# Patient Record
Sex: Male | Born: 1960 | Race: Black or African American | Hispanic: No | Marital: Married | Smoking: Former smoker
Health system: Southern US, Community
[De-identification: ages and names within clinical notes are randomized; demographics above are authoritative.]

## PROBLEM LIST (undated history)

## (undated) DIAGNOSIS — H269 Unspecified cataract: Secondary | ICD-10-CM

## (undated) DIAGNOSIS — E118 Type 2 diabetes mellitus with unspecified complications: Secondary | ICD-10-CM

## (undated) DIAGNOSIS — K219 Gastro-esophageal reflux disease without esophagitis: Secondary | ICD-10-CM

## (undated) DIAGNOSIS — K760 Fatty (change of) liver, not elsewhere classified: Secondary | ICD-10-CM

## (undated) DIAGNOSIS — K5792 Diverticulitis of intestine, part unspecified, without perforation or abscess without bleeding: Secondary | ICD-10-CM

## (undated) DIAGNOSIS — I2510 Atherosclerotic heart disease of native coronary artery without angina pectoris: Secondary | ICD-10-CM

## (undated) DIAGNOSIS — I6529 Occlusion and stenosis of unspecified carotid artery: Secondary | ICD-10-CM

## (undated) DIAGNOSIS — D509 Iron deficiency anemia, unspecified: Secondary | ICD-10-CM

## (undated) DIAGNOSIS — Z9289 Personal history of other medical treatment: Secondary | ICD-10-CM

## (undated) DIAGNOSIS — J45909 Unspecified asthma, uncomplicated: Secondary | ICD-10-CM

## (undated) DIAGNOSIS — E785 Hyperlipidemia, unspecified: Secondary | ICD-10-CM

## (undated) DIAGNOSIS — G44009 Cluster headache syndrome, unspecified, not intractable: Secondary | ICD-10-CM

## (undated) HISTORY — DX: Gastro-esophageal reflux disease without esophagitis: K21.9

## (undated) HISTORY — DX: Hyperlipidemia, unspecified: E78.5

## (undated) HISTORY — DX: Occlusion and stenosis of unspecified carotid artery: I65.29

## (undated) HISTORY — DX: Type 2 diabetes mellitus with unspecified complications: E11.8

## (undated) HISTORY — DX: Unspecified cataract: H26.9

## (undated) HISTORY — DX: Personal history of other medical treatment: Z92.89

## (undated) HISTORY — DX: Fatty (change of) liver, not elsewhere classified: K76.0

## (undated) HISTORY — DX: Diverticulitis of intestine, part unspecified, without perforation or abscess without bleeding: K57.92

## (undated) HISTORY — DX: Cluster headache syndrome, unspecified, not intractable: G44.009

---

## 1979-08-22 DIAGNOSIS — D509 Iron deficiency anemia, unspecified: Secondary | ICD-10-CM

## 1979-08-22 HISTORY — DX: Iron deficiency anemia, unspecified: D50.9

## 1983-12-22 DIAGNOSIS — G44009 Cluster headache syndrome, unspecified, not intractable: Secondary | ICD-10-CM

## 1983-12-22 HISTORY — PX: RADIAL KERATOTOMY: SHX217

## 1983-12-22 HISTORY — DX: Cluster headache syndrome, unspecified, not intractable: G44.009

## 2003-09-03 ENCOUNTER — Encounter: Payer: Self-pay | Admitting: Emergency Medicine

## 2003-09-03 ENCOUNTER — Inpatient Hospital Stay (HOSPITAL_COMMUNITY): Admission: EM | Admit: 2003-09-03 | Discharge: 2003-09-05 | Payer: Self-pay | Admitting: Emergency Medicine

## 2003-09-04 ENCOUNTER — Encounter: Payer: Self-pay | Admitting: Cardiology

## 2003-10-01 ENCOUNTER — Encounter: Payer: Self-pay | Admitting: Cardiology

## 2003-10-01 ENCOUNTER — Encounter: Admission: RE | Admit: 2003-10-01 | Discharge: 2003-10-01 | Payer: Self-pay | Admitting: Cardiology

## 2008-06-26 ENCOUNTER — Emergency Department (HOSPITAL_COMMUNITY): Admission: EM | Admit: 2008-06-26 | Discharge: 2008-06-26 | Payer: Self-pay | Admitting: Emergency Medicine

## 2011-01-12 ENCOUNTER — Encounter: Payer: Self-pay | Admitting: Cardiology

## 2011-05-08 NOTE — Discharge Summary (Signed)
NAME:  Bradley Watson, Bradley Watson                        ACCOUNT NO.:  [id number]   MEDICAL RECORD NO.:  [id number]                   PATIENT TYPE:  INP   LOCATION:  2001                                 FACILITY:  [facility]   PHYSICIAN:  Eduardo Osier. Sharyn Lull, M.D.              DATE OF BIRTH:  [birth year]   DATE OF ADMISSION:  09/03/2003  DATE OF DISCHARGE:  09/04/2003                                 DISCHARGE SUMMARY   ADMISSION DIAGNOSES:  1. Recurrent chest pain, probable anginal equivalent, rule out myocardial     infarction.  2. Gastroesophageal reflux disease.  3. History of tobacco abuse.  4. Cocaine abuse.  5. History of alcohol abuse.  6. Glaucoma of the eyes.   FINAL DIAGNOSES:  1. Status post chest pain, negative stress Cardiolite.  2. Rule out gastroesophageal reflux disease.  3. Rule out peptic ulcer disease.  4. Hypercholesterolemia.  5. History of tobacco abuse.  6. History of cocaine abuse.  7. History of alcohol abuse.  8. Glaucoma of the eyes.   DISCHARGE MEDICATIONS:  1. Baby aspirin 81 mg one daily.  2. Protonix 40 mg one p.o. daily.  3. Lipitor 20 mg one tablet daily.  4. Nitrostat 0.4 mg sublingual - use as directed.  5. The patient has been advised to continue his eye drops as before.   ACTIVITY:  As tolerated.   DIET:  Low salt, low cholesterol.   FOLLOWUP:  With Dr. Shana Chute in two weeks.   CONDITION ON DISCHARGE:  Stable.   BRIEF HISTORY AND HOSPITAL COURSE:  [name] is a 50 year old black male  with past medical history significant for GERD, history of glaucoma of the  eyes.  He came to the ER complaining of retrosternal chest pain described as  discomfort off and on for one month and lately the severity has increased  with occasional right arm pain, grade 8/10 associated with exertion and is  relieved with rest.  Denies any nausea, vomiting, diaphoresis.  Denies  paroxysmal nocturnal dyspnea, orthopnea, leg swelling.  Denies palpitations,  light  headedness or syncope.  Denies relation of chest pain to breathing,  food or movement.  Denies any cough, fever, chills.  Denies any cardiac work  up in the past.  Denies history of recent cocaine abuse.   PAST MEDICAL HISTORY:  As above.   PAST SURGICAL HISTORY:  He had radial __________ in the past.   ALLERGIES:  He is allergic to PENICILLIN.   MEDICATION AT HOME:  Prilosec and Xalatan eye drops.   SOCIAL HISTORY:  He is married.  Smoked one pack per week for one year, quit  five months ago.  He is a recovering addict.  He used to drink half of one-  fifth of hard liquor every other week for 30 years, quit five months ago.  He used to smoke cocaine for approximately 20 years off and on two to three  times per week.  Last cocaine abuse was six months ago.  No history of IV  drugs.  Patient states he was tested for HIV approximately five months ago  which was negative.  He works for [employer].   FAMILY HISTORY:  Negative for coronary artery disease.   PHYSICAL EXAMINATION:  GENERAL: He was alert and oriented x3, in no acute  distress.  VITAL SIGNS: Blood pressure was 135/82, pulse was 53.  HEENT: Conjunctivae pink.  NECK: Supple, no JVD, no bruits.  LUNGS: Clear to auscultation without rhonchi, rales.  CARDIOVASCULAR: S1, S2 was normal, there was no S3 gallop.  ABDOMEN: Soft, bowel sounds are present, nontender.  EXTREMITIES: There is no clubbing, cyanosis or edema.   EKG showed sinus bradycardia but no acute ischemic changes.   LABORATORY DATA:  Two sets of cardiac enzymes, CPK's and troponin's were  negative.  His hemoglobin was 12.9, hematocrit 36.5, white count of 3700.  BUN 16, creatinine 0.9, cholesterol was 245, LDL 131, HDL 45.   BRIEF HISTORY AND HOSPITAL COURSE:  The patient was admitted to a telemetry  unit and myocardial infarction was ruled out by serial enzymes and EKG.  The  patient did not have any episodes of chest pain during the hospital stay.  The  patient underwent a stress Cardiolite which showed no evidence of  reversible ischemia today, with septal hypokinesia, ejection fraction of  47%.  The patient has been ambulating in the hallway without any problems.  The patient will be discharged home on above-medications and will be  followed by Dr. Shana Chute in two weeks.                                                Eduardo Osier. Sharyn Lull, M.D.    [facility]  D:  09/04/2003  T:  09/05/2003  Job:  [id number]

## 2011-09-17 LAB — BASIC METABOLIC PANEL
BUN: 11
CO2: 23
Calcium: 8.4
Chloride: 103
Creatinine, Ser: 0.71
GFR calc Af Amer: >60
GFR calc non Af Amer: >60
Glucose, Bld: 95
Potassium: 3.9
Sodium: 133 — ABNORMAL LOW

## 2011-09-17 LAB — CBC
HCT: 36.7 — ABNORMAL LOW
Hemoglobin: 12.2 — ABNORMAL LOW
MCHC: 33.2
MCV: 87.6
RDW: 16 — ABNORMAL HIGH

## 2011-09-17 LAB — POCT CARDIAC MARKERS
CKMB, poc: 2.3
Myoglobin, poc: 61.4
Myoglobin, poc: 62.2
Operator id: 294521

## 2012-02-19 DIAGNOSIS — K760 Fatty (change of) liver, not elsewhere classified: Secondary | ICD-10-CM

## 2012-02-19 DIAGNOSIS — K5792 Diverticulitis of intestine, part unspecified, without perforation or abscess without bleeding: Secondary | ICD-10-CM

## 2012-02-19 HISTORY — DX: Fatty (change of) liver, not elsewhere classified: K76.0

## 2012-02-19 HISTORY — DX: Diverticulitis of intestine, part unspecified, without perforation or abscess without bleeding: K57.92

## 2012-10-23 ENCOUNTER — Telehealth: Payer: Self-pay | Admitting: Gastroenterology

## 2012-10-23 NOTE — Telephone Encounter (Signed)
On call note at 1005. Pt has chest pain for several weeks that he feels is reflux related. He notes chest pain with exertion. He has not been seen by [facility] but has an appt with Dr. Jarold Motto soon and he would like to have the appt changed. I advised him to call Monday to try to reschedule his appt. I strongly advised him to contact his PCP immediately regarding his exertional chest pain. He states he has not seen his PCP in a few years and has not seen a Cardiologist. He seemed resistant to the idea that his pain might be cardiac and needed prompt attention.

## 2012-10-24 ENCOUNTER — Telehealth: Payer: Self-pay | Admitting: Gastroenterology

## 2012-10-24 ENCOUNTER — Encounter: Payer: Self-pay | Admitting: Physician Assistant

## 2012-10-24 ENCOUNTER — Ambulatory Visit (INDEPENDENT_AMBULATORY_CARE_PROVIDER_SITE_OTHER): Payer: PRIVATE HEALTH INSURANCE | Admitting: Physician Assistant

## 2012-10-24 VITALS — BP 136/82 | HR 80 | Ht 73.0 in | Wt 291.4 lb

## 2012-10-24 DIAGNOSIS — K219 Gastro-esophageal reflux disease without esophagitis: Secondary | ICD-10-CM

## 2012-10-24 DIAGNOSIS — R079 Chest pain, unspecified: Secondary | ICD-10-CM

## 2012-10-24 MED ORDER — DEXLANSOPRAZOLE 60 MG PO CPDR: 60.0000 mg | DELAYED_RELEASE_CAPSULE | Freq: Every day | ORAL | Status: DC

## 2012-10-24 NOTE — Telephone Encounter (Signed)
Pt reports symptoms on and off for several months, but Saturday, s&s were worse. 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 He states he can take a shower and feel this way and he gets it when he walks, especially up a hill. Explained to pt this does not sound like reflux or any GI symptom; it sounds cardiac. Pt replied he had the same trouble 20 years ago (?) and was told it was GI. I can find an ER visit for 2011 and then pt f/u with a Stress Test which he states was negative. Then, pt states a fellow he works with told him he had the same thing and he stated his "flap" wasn't working and acid is coming from his stomach. I asked about certain foods and he stated OJ and fried foods bother him. He woke up one night and acid came up in his throat and now he sleeps on several pillows. Pt states he has used Prilosec for years and has been using Zantac the last few weeks with better results. Pt will see Mike Gip, PA today.

## 2012-10-24 NOTE — Patient Instructions (Addendum)
We have given you samples of the following medication to take: Dexilant. Please take one capsule by mouth once daily before breakfast.  Please stop taking Zantac and Prilosec(Omperazole).  We made you an appointment with [facility] for 10-25-2012 at 2:30 pm, please arrive at 2:15 pm.   Per your request we faxed a work note to [employer] at [employer] at [phone].

## 2012-10-24 NOTE — Progress Notes (Signed)
Subjective:    Patient ID: Bradley Watson, male    DOB: 12-Apr-1961, 51 y.o.   MRN: [id number]  HPI Bradley Watson is a pleasant 51 year old African American male, known to Dr. Victorino Dike very remotely with diagnosis of acid reflux. He states that he has used over-the-counter Prilosec intermittently over the past several years, in general he only takes it when he has "burning" He comes in today stating that he has been having somewhat different symptoms over the past 3-4 weeks area He was awakened from sleep at about a month ago at 3 AM with fairly intense pain in the center of his chest which he states was burning in nature and nonradiating it lasted about an hour- This eventually eased off and was not associated with shortness of breath nausea vomiting or diaphoresis. Appendectomy says he Googled his symptoms, and decided that this could be coming from acid reflux. He is now taking his Prilosec on a daily basis. He says however ever since then he has been having daily symptoms with chest pain which is exacerbated by exertion. He says it is occurring with walking, is associated with a rapid heartbeat, fatigue, and the need to sit down. He says he feels somewhat winded with these episodes as well area he has not had any nausea vomiting or diaphoresis. 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 He is also had a few episodes with a discomfort in his left arm. He did have an episode of chest pain in 2004 for which he was admitted to the hospital, and review of those records shows that he did undergo a new meds stress test which was negative for ischemia him but did show some global hypokinesis with an ejection fraction of 47%. He is a nonsmoker, and family history is negative for coronary artery disease     Review of Systems  Constitutional: Negative.   HENT: Negative.   Eyes: Negative.   Respiratory: Positive for  shortness of breath.   Cardiovascular: Positive for chest pain and palpitations.  Gastrointestinal: Negative.   Genitourinary: Negative.   Musculoskeletal: Negative.   Neurological: Negative.   Hematological: Negative.   Psychiatric/Behavioral: Negative.    No outpatient prescriptions prior to visit.   Allergies  Allergen Reactions  . Penicillins Anxiety       Patient Active Problem List  Diagnosis  . GERD (gastroesophageal reflux disease)   History  Substance Use Topics  . Smoking status: Former Games developer  . Smokeless tobacco: Not on file     Comment: 15 yrs ago.  . Alcohol Use: Yes     Comment: Rarely, every 2-3 months miaght have a mixed drink.     Objective:   Physical Exam well-developed large African American male pleasant in no acute distress. Blood pressure 136/82 pulse 80 height 6 foot 1 weight 291. HEENT; nontraumatic normocephalic EOMI PERRLA sclera anicteric, Neck; supple no JVD, Cardiovascular; regular rate and rhythm with S1-S2 there's no murmur rub or gallop, Pulmonary; clear bilaterally, Abdomen; large soft nontender nondistended bowel sounds are active there is no palpable mass or hepatosplenomegaly, Rectal; not done, Extremities; no clubbing cyanosis or edema skin warm and dry, Psych; mood and affect normal and appropriate.        Assessment & Plan:  #64 51 year old male with new onset of exertional chest pain x3-4 weeks. Symptoms are associated with rapid heart rate, fatigue and  mild shortness of breath. His symptoms are definitely atypical for GERD and much more concerning for angina.  #2 obesity #3 history of GERD #4 previously documented mild cardiomyopathy with EF of 47%-2004  Plan; start Dexilant  60 mg by mouth every morning in place of Prilosec. Elevate the head of the bed 45 as he has been doing recently I spoke to Dr. Dorinda Hill cardiology, and patient has been worked in to see Dr. Riley Kill tomorrow November 5 at 2:30 PM. Patient has been  instructed that should he have a more prolonged episode of chest pain or more intense episode of chest pain that he should go to the emergency room at [facility]. He was given a note for work to remain out until after his cardiology visit tomorrow.

## 2012-10-25 ENCOUNTER — Ambulatory Visit (INDEPENDENT_AMBULATORY_CARE_PROVIDER_SITE_OTHER): Payer: PRIVATE HEALTH INSURANCE | Admitting: Cardiology

## 2012-10-25 ENCOUNTER — Ambulatory Visit: Payer: PRIVATE HEALTH INSURANCE | Admitting: Cardiology

## 2012-10-25 ENCOUNTER — Encounter: Payer: Self-pay | Admitting: Cardiology

## 2012-10-25 VITALS — BP 130/90 | HR 69 | Ht 73.0 in | Wt 290.0 lb

## 2012-10-25 DIAGNOSIS — R079 Chest pain, unspecified: Secondary | ICD-10-CM

## 2012-10-25 DIAGNOSIS — K219 Gastro-esophageal reflux disease without esophagitis: Secondary | ICD-10-CM

## 2012-10-25 MED ORDER — METOPROLOL TARTRATE 25 MG PO TABS: 12.5000 mg | ORAL_TABLET | Freq: Two times a day (BID) | ORAL | Status: DC

## 2012-10-25 NOTE — Patient Instructions (Addendum)
Your physician has requested that you have a cardiac catheterization. Cardiac catheterization is used to diagnose and/or treat various heart conditions. Doctors may recommend this procedure for a number of different reasons. The most common reason is to evaluate chest pain. Chest pain can be a symptom of coronary artery disease (CAD), and cardiac catheterization can show whether plaque is narrowing or blocking your heart's arteries. This procedure is also used to evaluate the valves, as well as measure the blood flow and oxygen levels in different parts of your heart. For further information please visit [email or web address] Please follow instruction sheet, as given.  Your physician recommends that you have lab work today: BMP, CBC and PT/INR  Your physician has recommended you make the following change in your medication: START Metoprolol Tartrate 25mg  take one-half tablet by mouth twice a day

## 2012-10-25 NOTE — Progress Notes (Signed)
   HPI:  The patient is a very pleasant 51 year old gentleman with no prior cardiac history. He is a Community education officer at [employer]. The point where he cannot navigate to help. If he goes from the bottom of the hill to the top of the hill which is less than 40-50 yards, his heart rate will go up and he will start getting substernal chest pressure. He thought that this might be indigestion, and he started himself on Prilosec, but it did not help. He was able to do most activities about 3-4 weeks ago. He now says that even walking up from the parking lot leads to shortness of breath and chest pressure, and when he sits down it all goes away. He's had no symptoms at rest. He has been more fatigued lately. In questioning him about the possibility of some sleep apnea, he's not sure about snoring. He's not had hypertension or known diabetes and is been a nonsmoker. There no other specific symptoms. He did start taking an aspirin. Minute appointment in the GI office, and after seeing him, I asked him to be seen first in cardiology.  Current Outpatient Prescriptions  Medication Sig Dispense Refill  . dexlansoprazole (DEXILANT) 60 MG capsule Take 1 capsule (60 mg total) by mouth daily.  20 capsule  0    Allergies  Allergen Reactions  . Penicillins Anxiety    Past Medical History  Diagnosis Date  . Diverticulitis 02-2012    CT scan   . Fatty liver 02-2012    Mild- CT scan   . GERD (gastroesophageal reflux disease)   . Cluster headaches 1985    Mid 80's    Past Surgical History  Procedure Date  . Radiokaritotomy 1985    Both eyes    Family History  Problem Relation Age of Onset  . Lung cancer Father   . Diabetes Mellitus II Paternal Grandmother     History   Social History  . Marital Status: Married    Spouse Name: N/A    Number of Children: N/A  . Years of Education: N/A   Occupational History  . Forensic psychologist of [place]   Social History Main  Topics  . Smoking status: Former Games developer  . Smokeless tobacco: Not on file     Comment: 15 yrs ago.  . Alcohol Use: Yes     Comment: Rarely, every 2-3 months miaght have a mixed drink.  . Drug Use: Yes  . Sexually Active: Not on file   Other Topics Concern  . Not on file   Social History Narrative  . No narrative on file    ROS: Please see the HPI.  All other systems reviewed and negative.  PHYSICAL EXAM:  BP 130/90  Pulse 69  Ht 6\' 1"  (1.854 m)  Wt 290 lb (131.543 kg)  BMI 38.26 kg/m2  General: Well developed, well nourished, in no acute distress. Head:  Normocephalic and atraumatic. Neck: no JVD Lungs: Clear to auscultation and percussion. Heart: Normal S1 and S2.  No murmur, rubs or gallops.  Abdomen:  Normal bowel sounds; soft; non tender; no organomegaly Pulses: Pulses normal in all 4 extremities. Extremities: No clubbing or cyanosis. No edema. Neurologic: Alert and oriented x 3.  EKG:  NSR.  Nonspecific T wave flattening.   ASSESSMENT AND PLAN:

## 2012-10-25 NOTE — Progress Notes (Signed)
Reviewed and agree with management plan. Malcolm T. Stark MD FACG 

## 2012-10-26 ENCOUNTER — Encounter (HOSPITAL_COMMUNITY): Payer: Self-pay | Admitting: Pharmacy Technician

## 2012-10-26 DIAGNOSIS — R0789 Other chest pain: Secondary | ICD-10-CM | POA: Insufficient documentation

## 2012-10-26 LAB — CBC WITH DIFFERENTIAL/PLATELET
Basophils Absolute: 0 10*3/uL (ref 0.0–0.1)
Hemoglobin: 13.2 g/dL (ref 13.0–17.0)
Lymphocytes Relative: 47.6 % — ABNORMAL HIGH (ref 12.0–46.0)
Monocytes Relative: 8.3 % (ref 3.0–12.0)
Neutro Abs: 2.1 10*3/uL (ref 1.4–7.7)
Platelets: 281 10*3/uL (ref 150.0–400.0)
RDW: 15.2 % — ABNORMAL HIGH (ref 11.5–14.6)

## 2012-10-26 LAB — BASIC METABOLIC PANEL
Calcium: 9.2 mg/dL (ref 8.4–10.5)
GFR: 113.19 mL/min (ref >60.00)
Glucose, Bld: 90 mg/dL (ref 70–99)
Sodium: 138 mEq/L (ref 135–145)

## 2012-10-26 LAB — PROTIME-INR: Prothrombin Time: 12.3 s (ref 10.2–12.4)

## 2012-10-26 NOTE — Assessment & Plan Note (Signed)
Please see note from the GI office.

## 2012-10-26 NOTE — Assessment & Plan Note (Addendum)
Patient provides a consistent history of progressively decreasing exercise tolerance. He said that he is barely able to get up from the car appearing to the room. He's been on Prilosec, and the symptoms of advanced despite that. We talked about the various options with regard to evaluation. Specifically, there is almost no scenario under which an exercise tolerance test would obviate proceeding to catheterization. He  provides a consistent history.  We actually pulled out Heberden's original description of angina, and the patient confirmed that it was described as his symptoms precisely. His electrocardiogram is unremarkable. We will start him on low-dose beta-blockade, and I have added an aspirin to his regimen. I do think that we will not be able to avoid cardiac catheterization at this point in time given a class III symptom description.explained the risks benefits alternatives to the patient, and he consents to proceed with a diagnostic catheterization procedure.  I cannot find a report of his previous echocardiogram, but in the GI no dimensions ejection fraction was 47% back in 2004

## 2012-10-27 ENCOUNTER — Encounter (HOSPITAL_COMMUNITY)
Admission: RE | Disposition: A | Discharge status: Home / Self Care | Payer: Self-pay | Source: Ambulatory Visit | Attending: Thoracic Surgery (Cardiothoracic Vascular Surgery)

## 2012-10-27 ENCOUNTER — Other Ambulatory Visit: Payer: Self-pay

## 2012-10-27 ENCOUNTER — Ambulatory Visit (HOSPITAL_COMMUNITY): Payer: PRIVATE HEALTH INSURANCE

## 2012-10-27 ENCOUNTER — Encounter (HOSPITAL_COMMUNITY): Payer: Self-pay | Admitting: Cardiology

## 2012-10-27 ENCOUNTER — Inpatient Hospital Stay (HOSPITAL_COMMUNITY)
Admission: RE | Admit: 2012-10-27 | Discharge: 2012-11-04 | DRG: 234 | Disposition: A | Discharge status: Home / Self Care | Payer: PRIVATE HEALTH INSURANCE | Source: Ambulatory Visit | Attending: Thoracic Surgery (Cardiothoracic Vascular Surgery) | Admitting: Thoracic Surgery (Cardiothoracic Vascular Surgery)

## 2012-10-27 DIAGNOSIS — I428 Other cardiomyopathies: Secondary | ICD-10-CM | POA: Diagnosis present

## 2012-10-27 DIAGNOSIS — R079 Chest pain, unspecified: Secondary | ICD-10-CM

## 2012-10-27 DIAGNOSIS — I2510 Atherosclerotic heart disease of native coronary artery without angina pectoris: Secondary | ICD-10-CM

## 2012-10-27 DIAGNOSIS — Z6839 Body mass index (BMI) 39.0-39.9, adult: Secondary | ICD-10-CM

## 2012-10-27 DIAGNOSIS — J90 Pleural effusion, not elsewhere classified: Secondary | ICD-10-CM | POA: Diagnosis not present

## 2012-10-27 DIAGNOSIS — I6529 Occlusion and stenosis of unspecified carotid artery: Secondary | ICD-10-CM | POA: Diagnosis present

## 2012-10-27 DIAGNOSIS — Z79899 Other long term (current) drug therapy: Secondary | ICD-10-CM

## 2012-10-27 DIAGNOSIS — E785 Hyperlipidemia, unspecified: Secondary | ICD-10-CM | POA: Diagnosis present

## 2012-10-27 DIAGNOSIS — Z7982 Long term (current) use of aspirin: Secondary | ICD-10-CM

## 2012-10-27 DIAGNOSIS — Z8719 Personal history of other diseases of the digestive system: Secondary | ICD-10-CM

## 2012-10-27 DIAGNOSIS — K7689 Other specified diseases of liver: Secondary | ICD-10-CM | POA: Diagnosis present

## 2012-10-27 DIAGNOSIS — E8779 Other fluid overload: Secondary | ICD-10-CM | POA: Diagnosis not present

## 2012-10-27 DIAGNOSIS — Z87891 Personal history of nicotine dependence: Secondary | ICD-10-CM

## 2012-10-27 DIAGNOSIS — K219 Gastro-esophageal reflux disease without esophagitis: Secondary | ICD-10-CM

## 2012-10-27 DIAGNOSIS — D62 Acute posthemorrhagic anemia: Secondary | ICD-10-CM | POA: Diagnosis not present

## 2012-10-27 DIAGNOSIS — I2582 Chronic total occlusion of coronary artery: Secondary | ICD-10-CM | POA: Diagnosis present

## 2012-10-27 DIAGNOSIS — Z880 Allergy status to penicillin: Secondary | ICD-10-CM

## 2012-10-27 DIAGNOSIS — I200 Unstable angina: Secondary | ICD-10-CM | POA: Diagnosis present

## 2012-10-27 DIAGNOSIS — K5900 Constipation, unspecified: Secondary | ICD-10-CM | POA: Diagnosis not present

## 2012-10-27 DIAGNOSIS — Z951 Presence of aortocoronary bypass graft: Secondary | ICD-10-CM

## 2012-10-27 DIAGNOSIS — J9819 Other pulmonary collapse: Secondary | ICD-10-CM | POA: Diagnosis not present

## 2012-10-27 HISTORY — DX: Unspecified asthma, uncomplicated: J45.909

## 2012-10-27 HISTORY — DX: Iron deficiency anemia, unspecified: D50.9

## 2012-10-27 HISTORY — DX: Atherosclerotic heart disease of native coronary artery without angina pectoris: I25.10

## 2012-10-27 HISTORY — PX: LEFT HEART CATHETERIZATION WITH CORONARY ANGIOGRAM: SHX5451

## 2012-10-27 HISTORY — PX: CARDIAC CATHETERIZATION: SHX172

## 2012-10-27 SURGERY — LEFT HEART CATHETERIZATION WITH CORONARY ANGIOGRAM
Anesthesia: LOCAL

## 2012-10-27 MED ORDER — SODIUM CHLORIDE 0.9 % IJ SOLN: 3.0000 mL | INTRAMUSCULAR | Status: DC | PRN

## 2012-10-27 MED ORDER — MIDAZOLAM HCL 2 MG/2ML IJ SOLN
INTRAMUSCULAR | Status: AC
  Filled 2012-10-27: qty 2

## 2012-10-27 MED ORDER — FENTANYL CITRATE 0.05 MG/ML IJ SOLN
INTRAMUSCULAR | Status: AC
  Filled 2012-10-27: qty 2

## 2012-10-27 MED ORDER — HYDROXYZINE HCL 10 MG PO TABS
10.0000 mg | ORAL_TABLET | Freq: Every day | ORAL | Status: DC
  Administered 2012-10-27 – 2012-11-04 (×8): 10 mg via ORAL
  Filled 2012-10-27 (×10): qty 1

## 2012-10-27 MED ORDER — NITROGLYCERIN 0.2 MG/ML ON CALL CATH LAB
INTRAVENOUS | Status: AC
  Filled 2012-10-27: qty 1

## 2012-10-27 MED ORDER — ASPIRIN 81 MG PO CHEW
324.0000 mg | CHEWABLE_TABLET | ORAL | Status: AC
  Administered 2012-10-27: 324 mg via ORAL
  Filled 2012-10-27: qty 4

## 2012-10-27 MED ORDER — ASPIRIN 81 MG PO TABS: 81.0000 mg | ORAL_TABLET | Freq: Every day | ORAL | Status: DC

## 2012-10-27 MED ORDER — ACETAMINOPHEN 325 MG PO TABS
650.0000 mg | ORAL_TABLET | ORAL | Status: DC | PRN
  Administered 2012-10-27: 650 mg via ORAL

## 2012-10-27 MED ORDER — HEPARIN SODIUM (PORCINE) 1000 UNIT/ML IJ SOLN
INTRAMUSCULAR | Status: AC
  Filled 2012-10-27: qty 1

## 2012-10-27 MED ORDER — SODIUM CHLORIDE 0.9 % IJ SOLN: 3.0000 mL | Freq: Two times a day (BID) | INTRAMUSCULAR | Status: DC

## 2012-10-27 MED ORDER — VITAMIN D3 25 MCG (1000 UNIT) PO TABS
1000.0000 [IU] | ORAL_TABLET | Freq: Every day | ORAL | Status: DC
Start: 2012-10-27 — End: 2012-11-04
  Administered 2012-10-27 – 2012-11-04 (×8): 1000 [IU] via ORAL
  Filled 2012-10-27 (×9): qty 1

## 2012-10-27 MED ORDER — SODIUM CHLORIDE 0.9 % IV SOLN: 250.0000 mL | INTRAVENOUS | Status: DC | PRN

## 2012-10-27 MED ORDER — VERAPAMIL HCL 2.5 MG/ML IV SOLN
INTRAVENOUS | Status: AC
  Filled 2012-10-27: qty 2

## 2012-10-27 MED ORDER — ASPIRIN 81 MG PO CHEW
81.0000 mg | CHEWABLE_TABLET | Freq: Every day | ORAL | Status: DC
  Administered 2012-10-28 – 2012-10-30 (×3): 81 mg via ORAL
  Filled 2012-10-27 (×3): qty 1

## 2012-10-27 MED ORDER — HEPARIN (PORCINE) IN NACL 2-0.9 UNIT/ML-% IJ SOLN
INTRAMUSCULAR | Status: AC
  Filled 2012-10-27: qty 1000

## 2012-10-27 MED ORDER — LIDOCAINE HCL (PF) 1 % IJ SOLN
INTRAMUSCULAR | Status: AC
  Filled 2012-10-27: qty 30

## 2012-10-27 MED ORDER — METOPROLOL TARTRATE 12.5 MG HALF TABLET
12.5000 mg | ORAL_TABLET | Freq: Two times a day (BID) | ORAL | Status: DC
  Administered 2012-10-27 – 2012-10-30 (×6): 12.5 mg via ORAL
  Filled 2012-10-27 (×10): qty 1

## 2012-10-27 MED ORDER — PANTOPRAZOLE SODIUM 40 MG PO TBEC
40.0000 mg | DELAYED_RELEASE_TABLET | Freq: Every day | ORAL | Status: DC
  Administered 2012-10-27 – 2012-10-29 (×3): 40 mg via ORAL
  Filled 2012-10-27 (×3): qty 1

## 2012-10-27 MED ORDER — ACETAMINOPHEN 325 MG PO TABS
ORAL_TABLET | ORAL | Status: AC
  Filled 2012-10-27: qty 2

## 2012-10-27 MED ORDER — SODIUM CHLORIDE 0.9 % IV SOLN
INTRAVENOUS | Status: DC
  Administered 2012-10-27: 10:00:00 via INTRAVENOUS

## 2012-10-27 MED ORDER — ONDANSETRON HCL 4 MG/2ML IJ SOLN: 4.0000 mg | Freq: Four times a day (QID) | INTRAMUSCULAR | Status: DC | PRN

## 2012-10-27 MED ORDER — SODIUM CHLORIDE 0.9 % IV SOLN: INTRAVENOUS | Status: AC

## 2012-10-27 NOTE — H&P (View-Only) (Signed)
   HPI:  The patient is a very pleasant 51-year-old gentleman with no prior cardiac history. 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 The point where he cannot navigate to help. If he goes from the bottom of the hill to the top of the hill which is less than 40-50 yards, his heart rate will go up and he will start getting substernal chest pressure. He thought that this might be indigestion, and he started himself on Prilosec, but it did not help. He was able to do most activities about 3-4 weeks ago. He now says that even walking up from the parking lot leads to shortness of breath and chest pressure, and when he sits down it all goes away. He's had no symptoms at rest. He has been more fatigued lately. In questioning him about the possibility of some sleep apnea, he's not sure about snoring. He's not had hypertension or known diabetes and is been a nonsmoker. There no other specific symptoms. He did start taking an aspirin. Minute appointment in the GI office, and after seeing him, I asked him to be seen first in cardiology.  Current Outpatient Prescriptions  Medication Sig Dispense Refill  . dexlansoprazole (DEXILANT) 60 MG capsule Take 1 capsule (60 mg total) by mouth daily.  20 capsule  0    Allergies  Allergen Reactions  . Penicillins Anxiety    Past Medical History  Diagnosis Date  . Diverticulitis 02-2012    CT scan   . Fatty liver 02-2012    Mild- CT scan   . GERD (gastroesophageal reflux disease)   . Cluster headaches 1985    Mid 80's    Past Surgical History  Procedure Date  . Radiokaritotomy 1985    Both eyes    Family History  Problem Relation Age of Onset  . Lung cancer Father   . Diabetes Mellitus II Paternal Grandmother     History   Social History  . Marital Status: Married    Spouse Name: N/A    Number of Children: N/A  . Years of Education: N/A   Occupational History  . Sales consultant     [employer] [place]   Social History Main  Topics  . Smoking status: Former Smoker  . Smokeless tobacco: Not on file     Comment: 15 yrs ago.  . Alcohol Use: Yes     Comment: Rarely, every 2-3 months miaght have a mixed drink.  . Drug Use: Yes  . Sexually Active: Not on file   Other Topics Concern  . Not on file   Social History Narrative  . No narrative on file    ROS: Please see the HPI.  All other systems reviewed and negative.  PHYSICAL EXAM:  BP 130/90  Pulse 69  Ht 6' 1" (1.854 m)  Wt 290 lb (131.543 kg)  BMI 38.26 kg/m2  General: Well developed, well nourished, in no acute distress. Head:  Normocephalic and atraumatic. Neck: no JVD Lungs: Clear to auscultation and percussion. Heart: Normal S1 and S2.  No murmur, rubs or gallops.  Abdomen:  Normal bowel sounds; soft; non tender; no organomegaly Pulses: Pulses normal in all 4 extremities. Extremities: No clubbing or cyanosis. No edema. Neurologic: Alert and oriented x 3.  EKG:  NSR.  Nonspecific T wave flattening.   ASSESSMENT AND PLAN: 

## 2012-10-27 NOTE — Interval H&P Note (Signed)
History and Physical Interval Note:  10/27/2012 8:34 AM  I saw the patient, examined him, and explained all of the reasons for diagnostic cath.  Procedure has been set up by [facility] for repeat angiography.  He has prior DES placement to the RCA.  He has not had symptoms over the past two weeks.  I have reviewed all pertinent notes and information.  He is agreeable to proceed.  Labs reviewed.    [name]  has presented today for surgery, with the diagnosis of Chest pain  The various methods of treatment have been discussed with the patient and family. After consideration of risks, benefits and other options for treatment, the patient has consented to  Procedure(s) (LRB) with comments: LEFT HEART CATHETERIZATION WITH CORONARY ANGIOGRAM (N/A) as a surgical intervention .  The patient's history has been reviewed, patient examined, no change in status, stable for surgery.  I have reviewed the patient's chart and labs.  Questions were answered to the patient's satisfaction.     [name]

## 2012-10-27 NOTE — CV Procedure (Signed)
   Cardiac Catheterization Procedure Note  Name: Bradley Watson MRN: [id number] DOB: January 21, 1961  Procedure: Left Heart Cath, Selective Coronary Angiography, LV angiography  Indication: recent onset of class III chest pain   Procedural Details: The right wrist was prepped, draped, and anesthetized with 1% lidocaine. Using the modified Seldinger technique, a 5 French sheath was introduced into the right radial artery. 3 mg of verapamil was administered through the sheath, weight-based unfractionated heparin was administered intravenously. Standard Judkins catheters were used for selective coronary angiography and left ventriculography. Catheter exchanges were performed over an exchange length guidewire. There were no immediate procedural complications. A TR band was used for radial hemostasis at the completion of the procedure.  The patient was transferred to the post catheterization recovery area for further monitoring.  Procedural Findings: Hemodynamics: AO 128/75 (97) LV 136/5 No gradient  Coronary angiography: Coronary dominance: right  Left mainstem: Free of significant disease  Left anterior descending (LAD): Large caliber vessel that courses to and wraps the apex.  It also supplies collaterals to the dominant CFX system.  There is a hazy 70% lesion in the proximal vessel.  The distal vessel wraps the apex and has modest luminal irregularity.    Left circumflex (LCx): Dominant vessel.  There is a large ramus with mild irregularity.  The AV circ is totally occluded after a marginal branch.  The marginal has 80% proximal lesion just before the total occlusion.  The distal cfx is collateralized by the the LAD and non dominant RCA  Right coronary artery (RCA): Non dominant without significant obstruction.  Collaterals present, well formed, to the distal CFX, two moderate branches.    Left ventriculography: Left ventricular systolic function is reduced, LVEF is estimated at 50%, there is  no significant mitral regurgitation.  There is inferobasal hypokinesis.     Final Conclusions:   1.  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  Hypodense LAD lesion of 70% 3.  Reduced LV with inferobasal WMA.   Recommendations:  1.  The CFX looks old and not likely the cause of progressive symptoms.  The LAD is worrisome for unstable plaque.  I think he needs revascularization---will get a surgical consult and discuss options.  The CFX is a bifurcation lesion that is not favorable for PCI.    [name] 10/27/2012, 1:49 PM

## 2012-10-27 NOTE — Interval H&P Note (Signed)
History and Physical Interval Note:  10/27/2012 12:34 PM  Bradley Watson  has presented today for surgery, with the diagnosis of Chest pain  The various methods of treatment have been discussed with the patient and family. After consideration of risks, benefits and other options for treatment, the patient has consented to  Procedure(s) (LRB) with comments: LEFT HEART CATHETERIZATION WITH CORONARY ANGIOGRAM (N/A) as a surgical intervention .  The patient's history has been reviewed, patient examined, no change in status, stable for surgery.  I have reviewed the patient's chart and labs.  Questions were answered to the patient's satisfaction.     [name]

## 2012-10-27 NOTE — Consult Note (Signed)
Reason for Consult:Severe 2 vessel CAD Referring Physician: Dr. Marthe Patch is an 51 y.o. male.  HPI: 51 yo male with no prior cardiac history. Presents with recent onset CP. He describes this as a pressure sensation, usually but not always with exertion. He has noted fairly regularly when walking 40-50 yards up an incline when going to work. He says the pain is accompanied by SOB and is relieved by rest. He had similar symptoms back in 2004 but test showed no problems with his heart.   He initially thought he had reflux and went to see GI. They were concerned it was angina and referrred him to Dr. Riley Kill. Today he underwent cardiac cath and was found to have severe 2 vessel CAD, involving the LAD and circumflex. He currently is pain free.  Past Medical History  Diagnosis Date  . Diverticulitis 02-2012    CT scan   . Fatty liver 02-2012    Mild- CT scan   . GERD (gastroesophageal reflux disease)   . Cardiomyopathy 2004    mild; EF 47%/H&P 10/24/2012  . Coronary artery disease   . Anginal pain     "probably had it for years; worse recently" (10/27/2012)  . Childhood asthma   . Iron deficiency anemia 1980's  . Cluster headaches 1985    Mid 80's    Past Surgical History  Procedure Date  . Cardiac catheterization 10/27/2012  . Radial keratotomy 1985    bilaterally    Family History  Problem Relation Age of Onset  . Lung cancer Father   . Diabetes Mellitus II Paternal Grandmother     Social History:  reports that he has quit smoking. His smoking use included Cigarettes. He has a .18 pack-year smoking history. He has never used smokeless tobacco. He reports that he drinks alcohol. He reports that he uses illicit drugs (Marijuana and "Crack" cocaine).  Allergies:  Allergies  Allergen Reactions  . Penicillins Anxiety and Other (See Comments)    "made me feel like gravity had increased and it was pulling me down" (10/27/2012)    Medications:  Prior to Admission:    Prescriptions prior to admission  Medication Sig Dispense Refill  . aspirin 81 MG tablet Take 81 mg by mouth daily.      . cholecalciferol (VITAMIN D) 1000 UNITS tablet Take 1,000 Units by mouth daily.      Marland Kitchen dexlansoprazole (DEXILANT) 60 MG capsule Take 1 capsule (60 mg total) by mouth daily.  20 capsule  0  . hydrOXYzine (ATARAX/VISTARIL) 10 MG tablet Take 10 mg by mouth daily.      . metoprolol tartrate (LOPRESSOR) 25 MG tablet Take 0.5 tablets (12.5 mg total) by mouth 2 (two) times daily.  60 tablet  3  . omega-3 acid ethyl esters (LOVAZA) 1 G capsule Take 1 g by mouth daily.      Marland Kitchen OVER THE COUNTER MEDICATION Take 2 tablets by mouth daily. Ageless Male      . triamcinolone ointment (KENALOG) 0.1 % Apply 1 application topically daily as needed.      . vitamin B-12 (CYANOCOBALAMIN) 1000 MCG tablet Take 1,000 mcg by mouth daily.        No results found for this or any previous visit (from the past 48 hour(s)).  Dg Chest 2 View  10/27/2012  *RADIOLOGY REPORT*  Clinical Data: Precardiac catheterization  CHEST - 2 VIEW  Comparison: 06/26/2008  Findings: The heart size and mediastinal contours are within normal limits.  Both lungs are clear.  The visualized skeletal structures are unremarkable.  IMPRESSION: No active cardiopulmonary abnormalities.   Original Report Authenticated By: Signa Kell, M.D.     Review of Systems  Constitutional: Positive for malaise/fatigue. Negative for fever, chills and weight loss.  Cardiovascular: Positive for chest pain. Negative for orthopnea, claudication, leg swelling and PND.  Gastrointestinal: Positive for heartburn.  All other systems reviewed and are negative.   Blood pressure 134/71, pulse 60, temperature 97.7 F (36.5 C), temperature source Oral, resp. rate 15, height 6\' 1"  (1.854 m), weight 290 lb (131.543 kg), SpO2 97.00%. Physical Exam  Vitals reviewed. Constitutional: He is oriented to person, place, and time. He appears well-developed and  well-nourished. No distress.  HENT:  Head: Normocephalic and atraumatic.  Eyes: EOM are normal. Pupils are equal, round, and reactive to light.  Neck: Neck supple. No thyromegaly present.       No bruits  Cardiovascular: Normal rate, regular rhythm, normal heart sounds and intact distal pulses.  Exam reveals no gallop and no friction rub.   No murmur heard. Respiratory: Breath sounds normal. He has no wheezes. He has no rales.  GI: Soft. There is no tenderness.  Musculoskeletal: Normal range of motion. He exhibits no edema.  Lymphadenopathy:    He has no cervical adenopathy.  Neurological: He is alert and oriented to person, place, and time. No cranial nerve deficit.  Skin: Skin is warm and dry.    Assessment/Plan: 51 yo male with recent onset angina. He has severe 2 vessel CAD with an ulcerated 70% stenosis in the LAD and a total occlusion in the left circumflex compromising 2 OMs and the LPDA. CABG is the best option for revascularization.  I have discussed with the patient the general nature of the procedure, need for general anesthesia, and incisions to be used. I discussed the expected hospital stay, overall recovery and short and long term outcomes. He understands the risks include but are not limited to death, stroke, MI, DVT/PE, bleeding, possible need for transfusion, infections, cardiac arrhythmias, and other organ system dysfunction including respiratory, renal, or GI complications. We did discuss possible use of the left radial artery, although I will wait for the vascular lab assessment of the palmar arch before making a decsion about that. He accepts the risks of CABG and agrees to proceed.  Plan OR Monday 10/31/12  HENDRICKSON,STEVEN C 10/27/2012, 5:02 PM

## 2012-10-28 ENCOUNTER — Observation Stay (HOSPITAL_COMMUNITY): Payer: PRIVATE HEALTH INSURANCE

## 2012-10-28 DIAGNOSIS — I200 Unstable angina: Secondary | ICD-10-CM | POA: Diagnosis present

## 2012-10-28 DIAGNOSIS — E785 Hyperlipidemia, unspecified: Secondary | ICD-10-CM | POA: Diagnosis present

## 2012-10-28 DIAGNOSIS — Z01810 Encounter for preprocedural cardiovascular examination: Secondary | ICD-10-CM

## 2012-10-28 MED ORDER — ~~LOC~~ CARDIAC SURGERY, PATIENT & FAMILY EDUCATION
Freq: Once | Status: AC
  Administered 2012-10-28: 18:00:00
  Filled 2012-10-28: qty 1

## 2012-10-28 MED ORDER — ALBUTEROL SULFATE (5 MG/ML) 0.5% IN NEBU
2.5000 mg | INHALATION_SOLUTION | Freq: Once | RESPIRATORY_TRACT | Status: AC
  Administered 2012-10-28: 12:00:00 2.5 mg via RESPIRATORY_TRACT

## 2012-10-28 NOTE — Plan of Care (Signed)
Problem: Consults Goal: Cardiac Surgery Patient Education ( See Patient Education module for education specifics.)  Outcome: Completed/Met Date Met:  10/28/12 Book and video given with wife at bedside

## 2012-10-28 NOTE — Progress Notes (Addendum)
VASCULAR LAB PRELIMINARY  PRELIMINARY  PRELIMINARY  PRELIMINARY  Pre-op Cardiac Surgery  Carotid Findings:  Right:  40-59% ICA stenosis.  Left:  No evidence of hemodynamically significant internal carotid artery stenosis.   Bilateral:  Vertebral artery flow is antegrade.     Upper Extremity Right Left  Brachial Pressures 144 T 144 T  Radial Waveforms T T  Ulnar Waveforms T T  Palmar Arch (Allen's Test) Cardiac cath via radial artery WNL   Findings:  Left:  Doppler waveforms remain normal with ulnar and radial compressions.    Lower  Extremity Right Left  Dorsalis Pedis    Anterior Tibial    Posterior Tibial    Ankle/Brachial Indices      Findings:  Palpable pedal pulses x 4.   Bradley Watson, 10/28/2012, 9:57 AM

## 2012-10-28 NOTE — Progress Notes (Signed)
Utilization review completed.  

## 2012-10-28 NOTE — Progress Notes (Signed)
Subjective:  Stable.  No chest pain.  Appreciate note of Dr. Dorris Fetch.  Awaiting surgery on Monday.    Objective:  Vital Signs in the last 24 hours: Temp:  [97.7 F (36.5 C)-98 F (36.7 C)] 98 F (36.7 C) (11/08 0430) Pulse Rate:  [51-60] 57  (11/08 1059) Resp:  [13-18] 18  (11/08 0430) BP: (114-146)/(62-90) 129/76 mmHg (11/08 1059) SpO2:  [97 %-98 %] 98 % (11/08 0000) Weight:  [287 lb 4.2 oz (130.3 kg)] 287 lb 4.2 oz (130.3 kg) (11/08 0000)  Intake/Output from previous day: 11/07 0701 - 11/08 0700 In: 735 [P.O.:360; I.V.:375] Out: 1800 [Urine:1800]   Physical Exam: General: Well developed, well nourished, in no acute distress. Head:  Normocephalic and atraumatic. Lungs: Clear to auscultation and percussion. Heart: Normal S1 and S2.  No murmur, rubs or gallops.  Pulses: Pulses normal in all 4 extremities. Extremities: No clubbing or cyanosis. No edema. Neurologic: Alert and oriented x 3.    Lab Results:  [facility] 10/25/12 1613  WBC 5.1  HGB 13.2  PLT 281.0    Basename 10/25/12 1613  NA 138  K 4.0  CL 106  CO2 25  GLUCOSE 90  BUN 18  CREATININE 0.9   No results found for this basename: TROPONINI:2,CK,MB:2 in the last 72 hours Hepatic Function Panel No results found for this basename: PROT,ALBUMIN,AST,ALT,ALKPHOS,BILITOT,BILIDIR,IBILI in the last 72 hours No results found for this basename: CHOL in the last 72 hours No results found for this basename: PROTIME in the last 72 hours  Imaging: Dg Chest 2 View  10/27/2012  *RADIOLOGY REPORT*  Clinical Data: Precardiac catheterization  CHEST - 2 VIEW  Comparison: 06/26/2008  Findings: The heart size and mediastinal contours are within normal limits.  Both lungs are clear.  The visualized skeletal structures are unremarkable.  IMPRESSION: No active cardiopulmonary abnormalities.   Original Report Authenticated By: Signa Kell, M.D.     EKG:  Nonspecific ST and T changes.  No acute changes.       Assessment/Plan:  1.  Unstable angina pectoris with high grade LAD and CFX disease.  Plan  1.  CABG on Monday.        [name], MD, [facility], FSCAI 10/28/2012, 1:57 PM

## 2012-10-29 DIAGNOSIS — I2510 Atherosclerotic heart disease of native coronary artery without angina pectoris: Principal | ICD-10-CM

## 2012-10-29 LAB — CBC
HCT: 37.1 % — ABNORMAL LOW (ref 39.0–52.0)
MCH: 28.4 pg (ref 26.0–34.0)
MCHC: 33.4 g/dL (ref 30.0–36.0)
MCV: 85.1 fL (ref 78.0–100.0)
RDW: 14.8 % (ref 11.5–15.5)

## 2012-10-29 LAB — BASIC METABOLIC PANEL
BUN: 15 mg/dL (ref 6–23)
Calcium: 9.3 mg/dL (ref 8.4–10.5)
Chloride: 103 mEq/L (ref 96–112)
Creatinine, Ser: 1.04 mg/dL (ref 0.50–1.35)
GFR calc Af Amer: >90 mL/min (ref >90)
GFR calc non Af Amer: 80 mL/min — ABNORMAL LOW (ref >90)

## 2012-10-29 LAB — LIPID PANEL: Total CHOL/HDL Ratio: 5.6 RATIO

## 2012-10-29 NOTE — Progress Notes (Addendum)
Subjective:  Stable.  Had one episode of mild chest discomfort earlier this am which resolved spontaneously with rest.  Appreciate note of Dr. Dorris Fetch.  Awaiting surgery on Monday.    Objective:  Vital Signs in the last 24 hours: Temp:  [98 F (36.7 C)-98.6 F (37 C)] 98.6 F (37 C) (11/09 0420) Pulse Rate:  [55-74] 62  (11/09 0420) Resp:  [20-27] 20  (11/09 0420) BP: (122-145)/(62-84) 122/82 mmHg (11/09 0420) SpO2:  [97 %-98 %] 97 % (11/09 0420) Weight:  [284 lb 2.8 oz (128.9 kg)] 284 lb 2.8 oz (128.9 kg) (11/08 1832)  Intake/Output from previous day: 11/08 0701 - 11/09 0700 In: -  Out: 600 [Urine:600]   Physical Exam: General: Well developed, well nourished, in no acute distress. Head:  Normocephalic and atraumatic. Lungs: Clear to auscultation and percussion. Heart: Normal S1 and S2.  No murmur, rubs or gallops.  Pulses: Pulses normal in all 4 extremities. Extremities: No clubbing or cyanosis. No edema. Neurologic: Alert and oriented x 3.    Lab Results:  [facility] 10/29/12 0620  WBC 4.3  HGB 12.4*  PLT 282    Basename 10/29/12 0620  NA 140  K 4.4  CL 103  CO2 27  GLUCOSE 107*  BUN 15  CREATININE 1.04   No results found for this basename: TROPONINI:2,CK,MB:2 in the last 72 hours Hepatic Function Panel No results found for this basename: PROT,ALBUMIN,AST,ALT,ALKPHOS,BILITOT,BILIDIR,IBILI in the last 72 hours  Basename 10/29/12 0620  CHOL 272*   No results found for this basename: PROTIME in the last 72 hours  Imaging: Dg Chest 2 View  10/27/2012  *RADIOLOGY REPORT*  Clinical Data: Precardiac catheterization  CHEST - 2 VIEW  Comparison: 06/26/2008  Findings: The heart size and mediastinal contours are within normal limits.  Both lungs are clear.  The visualized skeletal structures are unremarkable.  IMPRESSION: No active cardiopulmonary abnormalities.   Original Report Authenticated By: Signa Kell, M.D.     EKG:  Nonspecific ST and T changes.  No  acute changes.   Telemetry:  Sinus bradycardia. Occ PVCs  Assessment/Plan:  1.  Unstable angina pectoris with high grade LAD and CFX disease.  Plan  1.  CABG on Monday.   2.  I wrote him an excuse to recover expenses for a cruise scheduled for December.      Cassell Clement, MD, [facility] 10/29/2012, 9:40 AM

## 2012-10-30 ENCOUNTER — Encounter (HOSPITAL_COMMUNITY): Payer: Self-pay | Admitting: Anesthesiology

## 2012-10-30 DIAGNOSIS — I517 Cardiomegaly: Secondary | ICD-10-CM

## 2012-10-30 LAB — BLOOD GAS, ARTERIAL
Acid-base deficit: 0.8 mmol/L (ref 0.0–2.0)
Drawn by: 10552
pCO2 arterial: 35.4 mmHg (ref 35.0–45.0)
pO2, Arterial: 82.3 mmHg (ref 80.0–100.0)

## 2012-10-30 LAB — URINALYSIS, ROUTINE W REFLEX MICROSCOPIC
Bilirubin Urine: NEGATIVE
Ketones, ur: NEGATIVE mg/dL
Nitrite: NEGATIVE
Urobilinogen, UA: 0.2 mg/dL (ref 0.0–1.0)
pH: 5.5 (ref 5.0–8.0)

## 2012-10-30 MED ORDER — SODIUM CHLORIDE 0.9 % IV SOLN
INTRAVENOUS | Status: AC
  Administered 2012-10-31: 1.1 [IU]/h via INTRAVENOUS
  Filled 2012-10-30: qty 1

## 2012-10-30 MED ORDER — EPINEPHRINE HCL 1 MG/ML IJ SOLN
0.5000 ug/min | INTRAVENOUS | Status: DC
  Filled 2012-10-30: qty 4

## 2012-10-30 MED ORDER — DEXMEDETOMIDINE HCL IN NACL 400 MCG/100ML IV SOLN
0.1000 ug/kg/h | INTRAVENOUS | Status: AC
  Administered 2012-10-31: 0.2 ug/kg/h via INTRAVENOUS
  Filled 2012-10-30: qty 100

## 2012-10-30 MED ORDER — ALPRAZOLAM 0.25 MG PO TABS: 0.2500 mg | ORAL_TABLET | ORAL | Status: DC | PRN

## 2012-10-30 MED ORDER — METOPROLOL TARTRATE 12.5 MG HALF TABLET
12.5000 mg | ORAL_TABLET | Freq: Once | ORAL | Status: DC
  Filled 2012-10-30 (×2): qty 1

## 2012-10-30 MED ORDER — PHENYLEPHRINE HCL 10 MG/ML IJ SOLN
30.0000 ug/min | INTRAMUSCULAR | Status: AC
  Administered 2012-10-31: 20 ug/min via INTRAVENOUS
  Filled 2012-10-30: qty 2

## 2012-10-30 MED ORDER — TEMAZEPAM 15 MG PO CAPS: 15.0000 mg | ORAL_CAPSULE | Freq: Once | ORAL | Status: AC | PRN

## 2012-10-30 MED ORDER — POTASSIUM CHLORIDE 2 MEQ/ML IV SOLN
80.0000 meq | INTRAVENOUS | Status: DC
  Filled 2012-10-30: qty 40

## 2012-10-30 MED ORDER — SODIUM CHLORIDE 0.9 % IV SOLN
1500.0000 mg | INTRAVENOUS | Status: AC
  Administered 2012-10-31: 1500 mg via INTRAVENOUS
  Filled 2012-10-30 (×2): qty 1500

## 2012-10-30 MED ORDER — CHLORHEXIDINE GLUCONATE 4 % EX LIQD
60.0000 mL | Freq: Once | CUTANEOUS | Status: AC
  Administered 2012-10-31: 4 via TOPICAL
  Filled 2012-10-30 (×2): qty 60

## 2012-10-30 MED ORDER — MAGNESIUM SULFATE 50 % IJ SOLN
40.0000 meq | INTRAMUSCULAR | Status: DC
  Filled 2012-10-30: qty 10

## 2012-10-30 MED ORDER — SODIUM CHLORIDE 0.9 % IV SOLN
INTRAVENOUS | Status: AC
  Administered 2012-10-31: 69.8 mL/h via INTRAVENOUS
  Filled 2012-10-30: qty 40

## 2012-10-30 MED ORDER — NITROGLYCERIN IN D5W 200-5 MCG/ML-% IV SOLN
2.0000 ug/min | INTRAVENOUS | Status: AC
  Administered 2012-10-31: 5 ug/min via INTRAVENOUS
  Filled 2012-10-30: qty 250

## 2012-10-30 MED ORDER — DOPAMINE-DEXTROSE 3.2-5 MG/ML-% IV SOLN
2.0000 ug/kg/min | INTRAVENOUS | Status: DC
  Filled 2012-10-30: qty 250

## 2012-10-30 MED ORDER — PLASMA-LYTE 148 IV SOLN
INTRAVENOUS | Status: AC
  Administered 2012-10-31: 10:00:00
  Filled 2012-10-30: qty 2.5

## 2012-10-30 MED ORDER — LEVOFLOXACIN IN D5W 500 MG/100ML IV SOLN
500.0000 mg | INTRAVENOUS | Status: AC
  Administered 2012-10-31: 750 mg via INTRAVENOUS
  Filled 2012-10-30 (×2): qty 100

## 2012-10-30 MED ORDER — DIAZEPAM 5 MG PO TABS
10.0000 mg | ORAL_TABLET | Freq: Once | ORAL | Status: AC
  Administered 2012-10-31: 10 mg via ORAL
  Filled 2012-10-30: qty 2

## 2012-10-30 MED ORDER — BISACODYL 5 MG PO TBEC: 5.0000 mg | DELAYED_RELEASE_TABLET | Freq: Once | ORAL | Status: DC

## 2012-10-30 NOTE — Progress Notes (Signed)
3 Days Post-Op Procedure(s) (LRB): LEFT HEART CATHETERIZATION WITH CORONARY ANGIOGRAM (N/A) Subjective: Had brief chest pain yesterday and this am while up moving around.  Objective: Vital signs in last 24 hours: Temp:  [98.3 F (36.8 C)-98.6 F (37 C)] 98.6 F (37 C) (11/10 0519) Pulse Rate:  [52-60] 60  (11/10 0519) Cardiac Rhythm:  [-] Sinus bradycardia (11/10 0943) Resp:  [18-19] 19  (11/10 0519) BP: (124-146)/(76-92) 140/92 mmHg (11/10 0519) SpO2:  [98 %-100 %] 98 % (11/10 0519)  Hemodynamic parameters for last 24 hours:    Intake/Output from previous day: 11/09 0701 - 11/10 0700 In: 480 [P.O.:480] Out: -  Intake/Output this shift: Total I/O In: 240 [P.O.:240] Out: -   General appearance: alert and cooperative Heart: regular rate and rhythm, S1, S2 normal, no murmur, click, rub or gallop Lungs: clear to auscultation bilaterally  Lab Results:  [facility] 10/29/12 0620  WBC 4.3  HGB 12.4*  HCT 37.1*  PLT 282   BMET:  Basename 10/29/12 0620  NA 140  K 4.4  CL 103  CO2 27  GLUCOSE 107*  BUN 15  CREATININE 1.04  CALCIUM 9.3    PT/INR: No results found for this basename: LABPROT,INR in the last 72 hours ABG No results found for this basename: phart, pco2, po2, hco3, tco2, acidbasedef, o2sat   CBG (last 3)  No results found for this basename: GLUCAP:3 in the last 72 hours   Pre-op Cardiac Surgery  Carotid Findings: Right: 40-59% ICA stenosis. Left: No evidence of hemodynamically significant internal carotid artery stenosis. Bilateral: Vertebral artery flow is antegrade.  Upper Extremity  Right  Left   Brachial Pressures  144 T  144 T   Radial Waveforms  T  T   Ulnar Waveforms  T  T   Palmar Arch (Allen's Test)  Cardiac cath via radial artery  WNL   Findings: Left: Doppler waveforms remain normal with ulnar and radial compressions.  Lower Extremity  Right  Left   Dorsalis Pedis     Anterior Tibial     Posterior Tibial     Ankle/Brachial Indices       Findings: Palpable pedal pulses x 4.   Assessment/Plan: S/P Procedure(s) (LRB): LEFT HEART CATHETERIZATION WITH CORONARY ANGIOGRAM (N/A) Stable for CABG by Dr. Dorris Fetch in am.   LOS: 3 days    Evelene Croon K 10/30/2012

## 2012-10-30 NOTE — Progress Notes (Signed)
Subjective:  Overall doing well.  Mild chest discomfort yesterday and today while moving around in room, did not require SL NTG.  Objective:  Vital Signs in the last 24 hours: Temp:  [98.3 F (36.8 C)-98.6 F (37 C)] 98.6 F (37 C) (11/10 0519) Pulse Rate:  [52-60] 60  (11/10 0519) Resp:  [18-19] 19  (11/10 0519) BP: (124-146)/(76-92) 140/92 mmHg (11/10 0519) SpO2:  [98 %-100 %] 98 % (11/10 0519)  Intake/Output from previous day: 11/09 0701 - 11/10 0700 In: 480 [P.O.:480] Out: -    Physical Exam: General: Well developed, well nourished, in no acute distress. Head:  Normocephalic and atraumatic. Lungs: Clear to auscultation and percussion. Heart: Normal S1 and S2.  No murmur, rubs or gallops.  Pulses: Pulses normal in all 4 extremities. Extremities: No clubbing or cyanosis. No edema. Neurologic: Alert and oriented x 3.    Lab Results:  [facility] 10/29/12 0620  WBC 4.3  HGB 12.4*  PLT 282    Basename 10/29/12 0620  NA 140  K 4.4  CL 103  CO2 27  GLUCOSE 107*  BUN 15  CREATININE 1.04   No results found for this basename: TROPONINI:2,CK,MB:2 in the last 72 hours Hepatic Function Panel No results found for this basename: PROT,ALBUMIN,AST,ALT,ALKPHOS,BILITOT,BILIDIR,IBILI in the last 72 hours  Basename 10/29/12 0620  CHOL 272*   No results found for this basename: PROTIME in the last 72 hours  Imaging: No results found.  EKG:  Nonspecific ST and T changes.  No acute changes.   Telemetry:  Sinus bradycardia.  Assessment/Plan:  1.  Unstable angina pectoris with high grade LAD and CFX disease.  Plan  1.  CABG on Monday.   2.  I wrote him an excuse to recover expenses for a cruise scheduled for December.      Cassell Clement, MD,  10/30/2012, 10:39 AM

## 2012-10-30 NOTE — Progress Notes (Signed)
  Echocardiogram 2D Echocardiogram has been performed.  Georgian Co 10/30/2012, 5:44 PM

## 2012-10-31 ENCOUNTER — Ambulatory Visit (HOSPITAL_COMMUNITY): Payer: PRIVATE HEALTH INSURANCE | Admitting: Anesthesiology

## 2012-10-31 ENCOUNTER — Encounter (HOSPITAL_COMMUNITY)
Admission: RE | Disposition: A | Discharge status: Home / Self Care | Payer: Self-pay | Source: Ambulatory Visit | Attending: Thoracic Surgery (Cardiothoracic Vascular Surgery)

## 2012-10-31 ENCOUNTER — Encounter (HOSPITAL_COMMUNITY): Payer: Self-pay | Admitting: Anesthesiology

## 2012-10-31 ENCOUNTER — Inpatient Hospital Stay (HOSPITAL_COMMUNITY): Payer: PRIVATE HEALTH INSURANCE

## 2012-10-31 DIAGNOSIS — I2510 Atherosclerotic heart disease of native coronary artery without angina pectoris: Secondary | ICD-10-CM

## 2012-10-31 DIAGNOSIS — Z0279 Encounter for issue of other medical certificate: Secondary | ICD-10-CM

## 2012-10-31 HISTORY — PX: CORONARY ARTERY BYPASS GRAFT: SHX141

## 2012-10-31 HISTORY — PX: RADIAL ARTERY HARVEST: SHX5067

## 2012-10-31 LAB — SURGICAL PCR SCREEN
MRSA, PCR: NEGATIVE
Staphylococcus aureus: POSITIVE — AB

## 2012-10-31 LAB — POCT I-STAT 3, ART BLOOD GAS (G3+)
Bicarbonate: 23 mEq/L (ref 20.0–24.0)
Patient temperature: 38.6
TCO2: 27 mmol/L (ref 0–100)
pCO2 arterial: 41.5 mmHg (ref 35.0–45.0)
pCO2 arterial: 33.7 mmHg — ABNORMAL LOW (ref 35.0–45.0)
pH, Arterial: 7.399 (ref 7.350–7.450)
pH, Arterial: 7.406 (ref 7.350–7.450)
pH, Arterial: 7.307 — ABNORMAL LOW (ref 7.350–7.450)

## 2012-10-31 LAB — POCT I-STAT 4, (NA,K, GLUC, HGB,HCT)
Glucose, Bld: 102 mg/dL — ABNORMAL HIGH (ref 70–99)
Glucose, Bld: 93 mg/dL (ref 70–99)
Glucose, Bld: 107 mg/dL — ABNORMAL HIGH (ref 70–99)
Glucose, Bld: 131 mg/dL — ABNORMAL HIGH (ref 70–99)
HCT: 38 % — ABNORMAL LOW (ref 39.0–52.0)
HCT: 35 % — ABNORMAL LOW (ref 39.0–52.0)
HCT: 28 % — ABNORMAL LOW (ref 39.0–52.0)
HCT: 35 % — ABNORMAL LOW (ref 39.0–52.0)
Hemoglobin: 9.5 g/dL — ABNORMAL LOW (ref 13.0–17.0)
Sodium: 138 mEq/L (ref 135–145)
Sodium: 133 mEq/L — ABNORMAL LOW (ref 135–145)

## 2012-10-31 LAB — CBC
HCT: 39.1 % (ref 39.0–52.0)
HCT: 31.7 % — ABNORMAL LOW (ref 39.0–52.0)
HCT: 32 % — ABNORMAL LOW (ref 39.0–52.0)
MCH: 28.8 pg (ref 26.0–34.0)
MCHC: 34 g/dL (ref 30.0–36.0)
MCHC: 34.1 g/dL (ref 30.0–36.0)
MCV: 84.6 fL (ref 78.0–100.0)
MCV: 83.9 fL (ref 78.0–100.0)
MCV: 85.8 fL (ref 78.0–100.0)
Platelets: 154 10*3/uL (ref 150–400)
Platelets: 172 10*3/uL (ref 150–400)
RBC: 3.73 MIL/uL — ABNORMAL LOW (ref 4.22–5.81)
RDW: 14.7 % (ref 11.5–15.5)
RDW: 14.6 % (ref 11.5–15.5)
WBC: 7.7 10*3/uL (ref 4.0–10.5)

## 2012-10-31 LAB — POCT I-STAT, CHEM 8
BUN: 10 mg/dL (ref 6–23)
HCT: 35 % — ABNORMAL LOW (ref 39.0–52.0)
Hemoglobin: 11.9 g/dL — ABNORMAL LOW (ref 13.0–17.0)
Sodium: 141 mEq/L (ref 135–145)
TCO2: 21 mmol/L (ref 0–100)

## 2012-10-31 LAB — BASIC METABOLIC PANEL
BUN: 16 mg/dL (ref 6–23)
Creatinine, Ser: 1 mg/dL (ref 0.50–1.35)
GFR calc Af Amer: >90 mL/min (ref >90)
GFR calc non Af Amer: 84 mL/min — ABNORMAL LOW (ref >90)

## 2012-10-31 LAB — GLUCOSE, CAPILLARY
Glucose-Capillary: 142 mg/dL — ABNORMAL HIGH (ref 70–99)
Glucose-Capillary: 122 mg/dL — ABNORMAL HIGH (ref 70–99)
Glucose-Capillary: 140 mg/dL — ABNORMAL HIGH (ref 70–99)
Glucose-Capillary: 132 mg/dL — ABNORMAL HIGH (ref 70–99)

## 2012-10-31 LAB — ABO/RH: ABO/RH(D): AB POS

## 2012-10-31 LAB — HEMOGLOBIN A1C: Mean Plasma Glucose: 128 mg/dL — ABNORMAL HIGH (ref <117)

## 2012-10-31 LAB — HEMOGLOBIN AND HEMATOCRIT, BLOOD
HCT: 26.8 % — ABNORMAL LOW (ref 39.0–52.0)
Hemoglobin: 9.1 g/dL — ABNORMAL LOW (ref 13.0–17.0)

## 2012-10-31 LAB — PROTIME-INR: INR: 1.47 (ref 0.00–1.49)

## 2012-10-31 LAB — POCT I-STAT GLUCOSE: Glucose, Bld: 105 mg/dL — ABNORMAL HIGH (ref 70–99)

## 2012-10-31 LAB — PLATELET COUNT: Platelets: 157 10*3/uL (ref 150–400)

## 2012-10-31 LAB — CREATININE, SERUM: GFR calc Af Amer: >90 mL/min (ref >90)

## 2012-10-31 SURGERY — CORONARY ARTERY BYPASS GRAFTING (CABG)
Anesthesia: General | Site: Chest | Wound class: Clean

## 2012-10-31 MED ORDER — LIDOCAINE HCL (CARDIAC) 20 MG/ML IV SOLN
INTRAVENOUS | Status: DC | PRN
  Administered 2012-10-31: 75 mg via INTRAVENOUS

## 2012-10-31 MED ORDER — PHENYLEPHRINE HCL 10 MG/ML IJ SOLN
0.0000 ug/min | INTRAMUSCULAR | Status: DC
  Administered 2012-10-31: 5 ug/min via INTRAVENOUS
  Filled 2012-10-31 (×2): qty 2

## 2012-10-31 MED ORDER — VANCOMYCIN HCL IN DEXTROSE 1-5 GM/200ML-% IV SOLN
1000.0000 mg | Freq: Once | INTRAVENOUS | Status: AC
  Administered 2012-10-31: 1000 mg via INTRAVENOUS
  Filled 2012-10-31: qty 200

## 2012-10-31 MED ORDER — ONDANSETRON HCL 4 MG/2ML IJ SOLN: 4.0000 mg | Freq: Four times a day (QID) | INTRAMUSCULAR | Status: DC | PRN

## 2012-10-31 MED ORDER — BISACODYL 5 MG PO TBEC
10.0000 mg | DELAYED_RELEASE_TABLET | Freq: Every day | ORAL | Status: DC
  Administered 2012-11-01 – 2012-11-03 (×3): 10 mg via ORAL
  Filled 2012-10-31 (×4): qty 2

## 2012-10-31 MED ORDER — SIMVASTATIN 20 MG PO TABS
20.0000 mg | ORAL_TABLET | Freq: Every day | ORAL | Status: DC
  Filled 2012-10-31: qty 1

## 2012-10-31 MED ORDER — ACETAMINOPHEN 500 MG PO TABS
1000.0000 mg | ORAL_TABLET | Freq: Four times a day (QID) | ORAL | Status: DC
  Administered 2012-11-01 – 2012-11-04 (×13): 1000 mg via ORAL
  Filled 2012-10-31 (×17): qty 2

## 2012-10-31 MED ORDER — PANTOPRAZOLE SODIUM 40 MG PO TBEC
40.0000 mg | DELAYED_RELEASE_TABLET | Freq: Every day | ORAL | Status: DC
  Administered 2012-11-02: 40 mg via ORAL
  Filled 2012-10-31: qty 1

## 2012-10-31 MED ORDER — SODIUM CHLORIDE 0.9 % IJ SOLN: 3.0000 mL | INTRAMUSCULAR | Status: DC | PRN

## 2012-10-31 MED ORDER — ASPIRIN 81 MG PO CHEW: 324.0000 mg | CHEWABLE_TABLET | Freq: Every day | ORAL | Status: DC

## 2012-10-31 MED ORDER — METOPROLOL TARTRATE 12.5 MG HALF TABLET
12.5000 mg | ORAL_TABLET | Freq: Two times a day (BID) | ORAL | Status: DC
  Filled 2012-10-31 (×3): qty 1

## 2012-10-31 MED ORDER — LACTATED RINGERS IV SOLN
INTRAVENOUS | Status: DC | PRN
  Administered 2012-10-31: 07:00:00 via INTRAVENOUS

## 2012-10-31 MED ORDER — METOPROLOL TARTRATE 1 MG/ML IV SOLN: 2.5000 mg | INTRAVENOUS | Status: DC | PRN

## 2012-10-31 MED ORDER — MIDAZOLAM HCL 5 MG/5ML IJ SOLN
INTRAMUSCULAR | Status: DC | PRN
  Administered 2012-10-31: 5 mg via INTRAVENOUS
  Administered 2012-10-31: 3 mg via INTRAVENOUS
  Administered 2012-10-31 (×2): 2 mg via INTRAVENOUS
  Administered 2012-10-31: 1 mg via INTRAVENOUS
  Administered 2012-10-31 (×2): 2 mg via INTRAVENOUS
  Administered 2012-10-31: 3 mg via INTRAVENOUS

## 2012-10-31 MED ORDER — PROPOFOL 10 MG/ML IV BOLUS
INTRAVENOUS | Status: DC | PRN
  Administered 2012-10-31: 200 mg via INTRAVENOUS

## 2012-10-31 MED ORDER — INSULIN REGULAR HUMAN 100 UNIT/ML IJ SOLN
INTRAMUSCULAR | Status: DC
  Administered 2012-10-31: 0.9 [IU]/h via INTRAVENOUS
  Filled 2012-10-31: qty 1

## 2012-10-31 MED ORDER — ALBUMIN HUMAN 25 % IV SOLN
INTRAVENOUS | Status: DC | PRN
  Administered 2012-10-31: 12:00:00 via INTRAVENOUS

## 2012-10-31 MED ORDER — METOPROLOL TARTRATE 25 MG/10 ML ORAL SUSPENSION
12.5000 mg | Freq: Two times a day (BID) | ORAL | Status: DC
  Filled 2012-10-31: qty 5

## 2012-10-31 MED ORDER — METOPROLOL TARTRATE 25 MG/10 ML ORAL SUSPENSION
12.5000 mg | Freq: Two times a day (BID) | ORAL | Status: DC
  Filled 2012-10-31 (×3): qty 5

## 2012-10-31 MED ORDER — CALCIUM CHLORIDE 10 % IV SOLN
1.0000 g | Freq: Once | INTRAVENOUS | Status: AC
  Administered 2012-10-31: 1 g via INTRAVENOUS

## 2012-10-31 MED ORDER — FAMOTIDINE IN NACL 20-0.9 MG/50ML-% IV SOLN
20.0000 mg | Freq: Two times a day (BID) | INTRAVENOUS | Status: AC
  Administered 2012-10-31: 20 mg via INTRAVENOUS

## 2012-10-31 MED ORDER — VECURONIUM BROMIDE 10 MG IV SOLR
INTRAVENOUS | Status: DC | PRN
  Administered 2012-10-31 (×3): 5 mg via INTRAVENOUS

## 2012-10-31 MED ORDER — ARTIFICIAL TEARS OP OINT
TOPICAL_OINTMENT | OPHTHALMIC | Status: DC | PRN
  Administered 2012-10-31: 1 via OPHTHALMIC

## 2012-10-31 MED ORDER — DOCUSATE SODIUM 100 MG PO CAPS
200.0000 mg | ORAL_CAPSULE | Freq: Every day | ORAL | Status: DC
  Administered 2012-11-01 – 2012-11-04 (×4): 200 mg via ORAL
  Filled 2012-10-31 (×4): qty 2

## 2012-10-31 MED ORDER — CHLORHEXIDINE GLUCONATE CLOTH 2 % EX PADS
6.0000 | MEDICATED_PAD | Freq: Every day | CUTANEOUS | Status: DC
  Administered 2012-11-01 – 2012-11-02 (×2): 6 via TOPICAL

## 2012-10-31 MED ORDER — ACETAMINOPHEN 10 MG/ML IV SOLN
1000.0000 mg | Freq: Once | INTRAVENOUS | Status: AC
  Administered 2012-10-31: 1000 mg via INTRAVENOUS
  Filled 2012-10-31: qty 100

## 2012-10-31 MED ORDER — MORPHINE SULFATE 2 MG/ML IJ SOLN
1.0000 mg | INTRAMUSCULAR | Status: AC | PRN
  Administered 2012-10-31 – 2012-11-01 (×4): 2 mg via INTRAVENOUS
  Filled 2012-10-31 (×4): qty 1

## 2012-10-31 MED ORDER — SODIUM CHLORIDE 0.45 % IV SOLN
INTRAVENOUS | Status: DC
  Administered 2012-10-31: 20 mL/h via INTRAVENOUS

## 2012-10-31 MED ORDER — ASPIRIN EC 325 MG PO TBEC
325.0000 mg | DELAYED_RELEASE_TABLET | Freq: Every day | ORAL | Status: DC
  Administered 2012-11-01 – 2012-11-04 (×4): 325 mg via ORAL
  Filled 2012-10-31 (×4): qty 1

## 2012-10-31 MED ORDER — OXYCODONE HCL 5 MG PO TABS
5.0000 mg | ORAL_TABLET | ORAL | Status: DC | PRN
  Administered 2012-11-01 – 2012-11-02 (×7): 10 mg via ORAL
  Administered 2012-11-03: 5 mg via ORAL
  Administered 2012-11-03 – 2012-11-04 (×3): 10 mg via ORAL
  Filled 2012-10-31 (×12): qty 2

## 2012-10-31 MED ORDER — DEXMEDETOMIDINE HCL IN NACL 400 MCG/100ML IV SOLN
0.1000 ug/kg/h | INTRAVENOUS | Status: DC
  Administered 2012-10-31: 0.5 ug/kg/h via INTRAVENOUS
  Administered 2012-10-31: 0.7 ug/kg/h via INTRAVENOUS
  Filled 2012-10-31: qty 100

## 2012-10-31 MED ORDER — POTASSIUM CHLORIDE 10 MEQ/50ML IV SOLN
10.0000 meq | INTRAVENOUS | Status: AC
Start: 2012-10-31 — End: 2012-10-31

## 2012-10-31 MED ORDER — ISOSORBIDE MONONITRATE ER 30 MG PO TB24
30.0000 mg | ORAL_TABLET | Freq: Every day | ORAL | Status: DC
  Administered 2012-11-01 – 2012-11-04 (×4): 30 mg via ORAL
  Filled 2012-10-31 (×4): qty 1

## 2012-10-31 MED ORDER — INSULIN REGULAR BOLUS VIA INFUSION
0.0000 [IU] | Freq: Three times a day (TID) | INTRAVENOUS | Status: DC
  Filled 2012-10-31: qty 10

## 2012-10-31 MED ORDER — 0.9 % SODIUM CHLORIDE (POUR BTL) OPTIME
TOPICAL | Status: DC | PRN
  Administered 2012-10-31: 5000 mL

## 2012-10-31 MED ORDER — LEVOFLOXACIN IN D5W 750 MG/150ML IV SOLN
750.0000 mg | INTRAVENOUS | Status: AC
  Administered 2012-11-01: 750 mg via INTRAVENOUS
  Filled 2012-10-31: qty 150

## 2012-10-31 MED ORDER — LACTATED RINGERS IV SOLN
500.0000 mL | Freq: Once | INTRAVENOUS | Status: AC | PRN
  Administered 2012-10-31: 500 mL via INTRAVENOUS

## 2012-10-31 MED ORDER — METOPROLOL TARTRATE 12.5 MG HALF TABLET
12.5000 mg | ORAL_TABLET | Freq: Two times a day (BID) | ORAL | Status: DC
Start: 2012-10-31 — End: 2012-10-31
  Filled 2012-10-31: qty 1

## 2012-10-31 MED ORDER — HEMOSTATIC AGENTS (NO CHARGE) OPTIME
TOPICAL | Status: DC | PRN
  Administered 2012-10-31: 1 via TOPICAL

## 2012-10-31 MED ORDER — ALBUMIN HUMAN 5 % IV SOLN
250.0000 mL | INTRAVENOUS | Status: AC | PRN
  Administered 2012-10-31 (×3): 250 mL via INTRAVENOUS
  Filled 2012-10-31: qty 500

## 2012-10-31 MED ORDER — MAGNESIUM SULFATE 40 MG/ML IJ SOLN
4.0000 g | Freq: Once | INTRAMUSCULAR | Status: AC
  Administered 2012-10-31: 4 g via INTRAVENOUS
  Filled 2012-10-31: qty 100

## 2012-10-31 MED ORDER — MORPHINE SULFATE 2 MG/ML IJ SOLN
2.0000 mg | INTRAMUSCULAR | Status: DC | PRN
  Administered 2012-11-01: 2 mg via INTRAVENOUS
  Filled 2012-10-31: qty 1

## 2012-10-31 MED ORDER — HEPARIN SODIUM (PORCINE) 1000 UNIT/ML IJ SOLN
INTRAMUSCULAR | Status: DC | PRN
  Administered 2012-10-31: 2000 [IU] via INTRAVENOUS
  Administered 2012-10-31: 38000 [IU] via INTRAVENOUS

## 2012-10-31 MED ORDER — SODIUM CHLORIDE 0.9 % IV SOLN: 250.0000 mL | INTRAVENOUS | Status: DC

## 2012-10-31 MED ORDER — MIDAZOLAM HCL 2 MG/2ML IJ SOLN: 2.0000 mg | INTRAMUSCULAR | Status: DC | PRN

## 2012-10-31 MED ORDER — SODIUM CHLORIDE 0.9 % IV SOLN
INTRAVENOUS | Status: DC
  Administered 2012-10-31: 20 mL/h via INTRAVENOUS

## 2012-10-31 MED ORDER — MUPIROCIN 2 % EX OINT
1.0000 "application " | TOPICAL_OINTMENT | Freq: Two times a day (BID) | CUTANEOUS | Status: DC
  Administered 2012-10-31 – 2012-11-03 (×7): 1 via NASAL
  Filled 2012-10-31 (×3): qty 22

## 2012-10-31 MED ORDER — PROTAMINE SULFATE 10 MG/ML IV SOLN
INTRAVENOUS | Status: DC | PRN
  Administered 2012-10-31 (×3): 50 mg via INTRAVENOUS

## 2012-10-31 MED ORDER — BISACODYL 10 MG RE SUPP: 10.0000 mg | Freq: Every day | RECTAL | Status: DC

## 2012-10-31 MED ORDER — NITROGLYCERIN IN D5W 200-5 MCG/ML-% IV SOLN
7.0000 ug/min | INTRAVENOUS | Status: DC
  Administered 2012-10-31: 16.67 ug/min via INTRAVENOUS

## 2012-10-31 MED ORDER — SODIUM CHLORIDE 0.9 % IJ SOLN
OROMUCOSAL | Status: DC | PRN
  Administered 2012-10-31 (×3): via TOPICAL

## 2012-10-31 MED ORDER — ACETAMINOPHEN 160 MG/5ML PO SOLN
975.0000 mg | Freq: Four times a day (QID) | ORAL | Status: DC
  Filled 2012-10-31: qty 40.6

## 2012-10-31 MED ORDER — LACTATED RINGERS IV SOLN
INTRAVENOUS | Status: DC
  Administered 2012-10-31: 20 mL/h via INTRAVENOUS

## 2012-10-31 MED ORDER — LACTATED RINGERS IV SOLN
INTRAVENOUS | Status: DC | PRN
  Administered 2012-10-31 (×2): via INTRAVENOUS

## 2012-10-31 MED ORDER — ROCURONIUM BROMIDE 100 MG/10ML IV SOLN
INTRAVENOUS | Status: DC | PRN
  Administered 2012-10-31: 100 mg via INTRAVENOUS

## 2012-10-31 MED ORDER — SODIUM CHLORIDE 0.9 % IJ SOLN
3.0000 mL | Freq: Two times a day (BID) | INTRAMUSCULAR | Status: DC
  Administered 2012-11-01 – 2012-11-02 (×3): 3 mL via INTRAVENOUS

## 2012-10-31 MED ORDER — LACTATED RINGERS IV SOLN
INTRAVENOUS | Status: DC | PRN
  Administered 2012-10-31 (×2): via INTRAVENOUS

## 2012-10-31 MED ORDER — FENTANYL CITRATE 0.05 MG/ML IJ SOLN
INTRAMUSCULAR | Status: DC | PRN
  Administered 2012-10-31: 200 ug via INTRAVENOUS
  Administered 2012-10-31: 300 ug via INTRAVENOUS
  Administered 2012-10-31 (×3): 250 ug via INTRAVENOUS
  Administered 2012-10-31: 400 ug via INTRAVENOUS
  Administered 2012-10-31 (×2): 150 ug via INTRAVENOUS
  Administered 2012-10-31: 50 ug via INTRAVENOUS

## 2012-10-31 SURGICAL SUPPLY — 94 items
ATTRACTOMAT 16X20 MAGNETIC DRP (DRAPES) ×3 IMPLANT
BAG DECANTER FOR FLEXI CONT (MISCELLANEOUS) ×3 IMPLANT
BANDAGE ELASTIC 4 VELCRO ST LF (GAUZE/BANDAGES/DRESSINGS) ×4 IMPLANT
BANDAGE ELASTIC 6 VELCRO ST LF (GAUZE/BANDAGES/DRESSINGS) ×3 IMPLANT
BANDAGE GAUZE ELAST BULKY 4 IN (GAUZE/BANDAGES/DRESSINGS) ×4 IMPLANT
BASKET HEART (ORDER IN 25'S) (MISCELLANEOUS) ×1
BASKET HEART (ORDER IN 25S) (MISCELLANEOUS) ×2 IMPLANT
BLADE STERNUM SYSTEM 6 (BLADE) ×3 IMPLANT
CANISTER SUCTION 2500CC (MISCELLANEOUS) ×3 IMPLANT
CANNULA SOFTFLOW AORTIC 8M24FR (CANNULA) ×1 IMPLANT
CATH CPB KIT HENDRICKSON (MISCELLANEOUS) ×3 IMPLANT
CATH ROBINSON RED A/P 18FR (CATHETERS) ×3 IMPLANT
CATH THORACIC 36FR (CATHETERS) ×3 IMPLANT
CATH THORACIC 36FR RT ANG (CATHETERS) ×3 IMPLANT
CLIP TI MEDIUM 24 (CLIP) IMPLANT
CLIP TI WIDE RED SMALL 24 (CLIP) ×2 IMPLANT
CLOTH BEACON ORANGE TIMEOUT ST (SAFETY) ×3 IMPLANT
COVER MAYO STAND STRL (DRAPES) ×1 IMPLANT
COVER SURGICAL LIGHT HANDLE (MISCELLANEOUS) ×3 IMPLANT
COVER TABLE BACK 60X90 (DRAPES) ×1 IMPLANT
CRADLE DONUT ADULT HEAD (MISCELLANEOUS) ×3 IMPLANT
DRAPE CARDIOVASCULAR INCISE (DRAPES) ×3
DRAPE EXTREMITY T 121X128X90 (DRAPE) ×1 IMPLANT
DRAPE SLUSH/WARMER DISC (DRAPES) ×3 IMPLANT
DRAPE SRG 135X102X78XABS (DRAPES) ×2 IMPLANT
DRSG COVADERM 4X14 (GAUZE/BANDAGES/DRESSINGS) ×3 IMPLANT
ELECT REM PT RETURN 9FT ADLT (ELECTROSURGICAL) ×6
ELECTRODE REM PT RTRN 9FT ADLT (ELECTROSURGICAL) ×4 IMPLANT
GLOVE BIO SURGEON STRL SZ 6 (GLOVE) ×2 IMPLANT
GLOVE BIO SURGEON STRL SZ 6.5 (GLOVE) ×4 IMPLANT
GLOVE BIO SURGEON STRL SZ7 (GLOVE) ×1 IMPLANT
GLOVE BIO SURGEON STRL SZ7.5 (GLOVE) IMPLANT
GLOVE BIOGEL PI IND STRL 6.5 (GLOVE) IMPLANT
GLOVE BIOGEL PI IND STRL 7.0 (GLOVE) IMPLANT
GLOVE BIOGEL PI IND STRL 7.5 (GLOVE) IMPLANT
GLOVE BIOGEL PI INDICATOR 6.5 (GLOVE)
GLOVE BIOGEL PI INDICATOR 7.0 (GLOVE) ×8
GLOVE BIOGEL PI INDICATOR 7.5 (GLOVE)
GLOVE EUDERMIC 7 POWDERFREE (GLOVE) ×9 IMPLANT
GOWN PREVENTION PLUS XLARGE (GOWN DISPOSABLE) ×6 IMPLANT
GOWN STRL NON-REIN LRG LVL3 (GOWN DISPOSABLE) ×14 IMPLANT
HARMONIC SHEARS 14CM COAG (MISCELLANEOUS) ×1 IMPLANT
HEMOSTAT POWDER SURGIFOAM 1G (HEMOSTASIS) ×9 IMPLANT
HEMOSTAT SURGICEL 2X14 (HEMOSTASIS) ×3 IMPLANT
INSERT FOGARTY XLG (MISCELLANEOUS) IMPLANT
KIT BASIN OR (CUSTOM PROCEDURE TRAY) ×3 IMPLANT
KIT ROOM TURNOVER OR (KITS) ×3 IMPLANT
KIT SUCTION CATH 14FR (SUCTIONS) ×6 IMPLANT
KIT VASOVIEW W/TROCAR VH 2000 (KITS) ×3 IMPLANT
MARKER GRAFT CORONARY BYPASS (MISCELLANEOUS) ×9 IMPLANT
NS IRRIG 1000ML POUR BTL (IV SOLUTION) ×15 IMPLANT
PACK OPEN HEART (CUSTOM PROCEDURE TRAY) ×3 IMPLANT
PAD ARMBOARD 7.5X6 YLW CONV (MISCELLANEOUS) ×6 IMPLANT
PENCIL BUTTON HOLSTER BLD 10FT (ELECTRODE) ×3 IMPLANT
PUNCH AORTIC ROTATE 4.0MM (MISCELLANEOUS) ×1 IMPLANT
PUNCH AORTIC ROTATE 4.5MM 8IN (MISCELLANEOUS) IMPLANT
PUNCH AORTIC ROTATE 5MM 8IN (MISCELLANEOUS) IMPLANT
SET CARDIOPLEGIA MPS 5001102 (MISCELLANEOUS) ×1 IMPLANT
SPONGE GAUZE 4X4 12PLY (GAUZE/BANDAGES/DRESSINGS) ×7 IMPLANT
SPONGE LAP 18X18 X RAY DECT (DISPOSABLE) ×1 IMPLANT
SPONGE LAP 4X18 X RAY DECT (DISPOSABLE) ×1 IMPLANT
SUT BONE WAX W31G (SUTURE) ×3 IMPLANT
SUT MNCRL AB 4-0 PS2 18 (SUTURE) ×2 IMPLANT
SUT PROLENE 3 0 SH DA (SUTURE) ×3 IMPLANT
SUT PROLENE 4 0 RB 1 (SUTURE) ×12
SUT PROLENE 4 0 SH DA (SUTURE) ×2 IMPLANT
SUT PROLENE 4-0 RB1 .5 CRCL 36 (SUTURE) IMPLANT
SUT PROLENE 6 0 C 1 30 (SUTURE) ×8 IMPLANT
SUT PROLENE 7 0 BV1 MDA (SUTURE) ×3 IMPLANT
SUT PROLENE 8 0 BV175 6 (SUTURE) ×7 IMPLANT
SUT SILK  1 MH (SUTURE)
SUT SILK 1 MH (SUTURE) IMPLANT
SUT STEEL 6MS V (SUTURE) IMPLANT
SUT STEEL STERNAL CCS#1 18IN (SUTURE) IMPLANT
SUT STEEL SZ 6 DBL 3X14 BALL (SUTURE) IMPLANT
SUT VIC AB 1 CTX 36 (SUTURE) ×6
SUT VIC AB 1 CTX36XBRD ANBCTR (SUTURE) ×4 IMPLANT
SUT VIC AB 2-0 CT1 27 (SUTURE) ×3
SUT VIC AB 2-0 CT1 TAPERPNT 27 (SUTURE) IMPLANT
SUT VIC AB 2-0 CTX 27 (SUTURE) IMPLANT
SUT VIC AB 3-0 SH 27 (SUTURE)
SUT VIC AB 3-0 SH 27X BRD (SUTURE) IMPLANT
SUT VIC AB 3-0 X1 27 (SUTURE) IMPLANT
SUT VICRYL 4-0 PS2 18IN ABS (SUTURE) IMPLANT
SUTURE E-PAK OPEN HEART (SUTURE) ×3 IMPLANT
SYSTEM SAHARA CHEST DRAIN ATS (WOUND CARE) ×3 IMPLANT
TAPE CLOTH SURG 4X10 WHT LF (GAUZE/BANDAGES/DRESSINGS) ×1 IMPLANT
TOWEL OR 17X24 6PK STRL BLUE (TOWEL DISPOSABLE) ×6 IMPLANT
TOWEL OR 17X26 10 PK STRL BLUE (TOWEL DISPOSABLE) ×6 IMPLANT
TRAY FOLEY IC TEMP SENS 14FR (CATHETERS) ×3 IMPLANT
TUBE FEEDING 8FR 16IN STR KANG (MISCELLANEOUS) ×3 IMPLANT
TUBING INSUFFLATION 10FT LAP (TUBING) ×3 IMPLANT
UNDERPAD 30X30 INCONTINENT (UNDERPADS AND DIAPERS) ×3 IMPLANT
WATER STERILE IRR 1000ML POUR (IV SOLUTION) ×6 IMPLANT

## 2012-10-31 NOTE — Anesthesia Procedure Notes (Signed)
Procedure Name: Intubation Date/Time: 10/31/2012 7:51 AM Performed by: Carmela Rima Pre-anesthesia Checklist: Emergency Drugs available, Patient identified, Timeout performed, Suction available and Patient being monitored Patient Re-evaluated:Patient Re-evaluated prior to inductionOxygen Delivery Method: Circle system utilized Preoxygenation: Pre-oxygenation with 100% oxygen Intubation Type: IV induction Ventilation: Mask ventilation without difficulty Laryngoscope Size: Mac and 4 Grade View: Grade III Tube type: Oral Tube size: 8.0 mm Number of attempts: 1 Placement Confirmation: positive ETCO2 and breath sounds checked- equal and bilateral Secured at: 23 cm Tube secured with: Tape Dental Injury: Teeth and Oropharynx as per pre-operative assessment

## 2012-10-31 NOTE — Procedures (Signed)
Extubation Procedure Note  Patient Details:   Name: Bradley Watson DOB: [birth year] MRN: [id number]   Airway Documentation:     Evaluation  O2 sats: stable throughout Complications: No apparent complications Patient did tolerate procedure well. Bilateral Breath Sounds: Diminished   Patient suctioned prior to extubation. NIF -42, VC 1242. Pt. Able to follow commands. Pt. Able to vocalize post extubation.  Extubated to  3lpm [name].   Clent Ridges 10/31/2012, 7:55 PM

## 2012-10-31 NOTE — Transfer of Care (Signed)
Immediate Anesthesia Transfer of Care Note  Patient: Bradley Watson  Procedure(s) Performed: Procedure(s) (LRB) with comments: CORONARY ARTERY BYPASS GRAFTING (CABG) (N/A) RADIAL ARTERY HARVEST (Left)  Patient Location: SICU  Anesthesia Type:General  Level of Consciousness: Patient remains intubated per anesthesia plan  Airway & Oxygen Therapy: Patient placed on Ventilator (see vital sign flow sheet for setting)  Post-op Assessment: Report given to PACU RN and Post -op Vital signs reviewed and stable  Post vital signs: Reviewed and stable  Complications: No apparent anesthesia complications

## 2012-10-31 NOTE — Progress Notes (Signed)
TCTS BRIEF SICU PROGRESS NOTE  Day of Surgery  S/P Procedure(s) (LRB): CORONARY ARTERY BYPASS GRAFTING (CABG) (N/A) RADIAL ARTERY HARVEST (Left)   Sedated on vent AAI paced w/ stable BP on low dose Neo and NTG Chest tube output low Labs okay  Plan: Continue routine early postop  Dameer Speiser H 10/31/2012 6:28 PM

## 2012-10-31 NOTE — Brief Op Note (Addendum)
10/27/2012 - 10/31/2012  11:00 AM  PATIENT:  [name]  51 y.o. male  PRE-OPERATIVE DIAGNOSIS:  CAD  POST-OPERATIVE DIAGNOSIS:  CAD  PROCEDURE:  CORONARY ARTERY BYPASS GRAFTING (CABG) x 2 (LIMA to LAD, Left radial artery to OM) with open left radial artery harvest.  SURGEON:  Surgeon(s) and Role:    * Loreli Slot, MD - Primary  PHYSICIAN ASSISTANT: Doree Fudge PA-C   ANESTHESIA:   general  EBL:  Total I/O In: 1000 [I.V.:1000] Out: 250 [Urine:250]   DRAINS:  Chest Tube(s) in the Mediastinal and Pleural spaces    COUNTS CORRECT:  YES  DICTATION: .Dragon Dictation  PLAN OF CARE: Admit to inpatient   PATIENT DISPOSITION:  ICU - intubated and hemodynamically stable.   Delay start of Pharmacological VTE agent (>24hrs) due to surgical blood loss or risk of bleeding: yes  PRE OP WEIGHT: 129 kg   XC= 48 min + 21 min CPB= 119 min  Only one graftable OM present- 1 mm fair quality Radial, LIMA and LAD good quality LIMA to LAD anastomosis redone because of bleeding from LIMA which turned out to be a branch avulsed near the anastomosis.

## 2012-10-31 NOTE — Preoperative (Signed)
Beta Blockers   Reason not to administer Beta Blockers:Not Applicable 

## 2012-10-31 NOTE — Anesthesia Preprocedure Evaluation (Addendum)
Anesthesia Evaluation  Patient identified by MRN, date of birth, ID band Patient awake    Reviewed: Allergy & Precautions, H&P , NPO status , Patient's Chart, lab work & pertinent test results  Airway Mallampati: II  Neck ROM: full    Dental  (+) Teeth Intact and Dental Advidsory Given   Pulmonary          Cardiovascular + angina with exertion + CAD     Neuro/Psych  Headaches,    GI/Hepatic GERD-  ,  Endo/Other  Morbid obesity  Renal/GU      Musculoskeletal   Abdominal   Peds  Hematology   Anesthesia Other Findings   Reproductive/Obstetrics                          Anesthesia Physical Anesthesia Plan  ASA: III  Anesthesia Plan: General   Post-op Pain Management:    Induction: Intravenous  Airway Management Planned: Oral ETT  Additional Equipment: Arterial line, CVP and PA Cath  Intra-op Plan:   Post-operative Plan: Post-operative intubation/ventilation  Informed Consent: I have reviewed the patients History and Physical, chart, labs and discussed the procedure including the risks, benefits and alternatives for the proposed anesthesia with the patient or authorized representative who has indicated his/her understanding and acceptance.   Dental Advisory Given  Plan Discussed with: CRNA and Surgeon  Anesthesia Plan Comments:        Anesthesia Quick Evaluation

## 2012-10-31 NOTE — OR Nursing (Signed)
Patient positioned after intubation.  R2 pads placed on patient's right posterior shoulder and left flank.

## 2012-10-31 NOTE — Interval H&P Note (Signed)
History and Physical Interval Note:  10/31/2012 7:38 AM  Bradley Watson  has presented today for surgery, with the diagnosis of CAD  The various methods of treatment have been discussed with the patient and family. After consideration of risks, benefits and other options for treatment, the patient has consented to  Procedure(s) (LRB) with comments: CORONARY ARTERY BYPASS GRAFTING (CABG) (N/A) as a surgical intervention .  The patient's history has been reviewed, patient examined, no change in status, stable for surgery.  I have reviewed the patient's chart and labs.  Questions were answered to the patient's satisfaction.     Gilbert Manolis C  Allen's test normal. Confirmed by vascular lab. Will examine radial and use if adequate.

## 2012-10-31 NOTE — H&P (Signed)
Reason for Consult:Severe 2 vessel CAD Referring Physician: Dr. Stuckey  Bradley Watson is an 51 y.o. male.  HPI: 51 yo male with no prior cardiac history. Presents with recent onset CP. He describes this as a pressure sensation, usually but not always with exertion. He has noted fairly regularly when walking 40-50 yards up an incline when going to work. He says the pain is accompanied by SOB and is relieved by rest. He had similar symptoms back in 2004 but test showed no problems with his heart.   He initially thought he had reflux and went to see GI. They were concerned it was angina and referrred him to Dr. Stuckey. Today he underwent cardiac cath and was found to have severe 2 vessel CAD, involving the LAD and circumflex. He currently is pain free.  Past Medical History  Diagnosis Date  . Diverticulitis 02-2012    CT scan   . Fatty liver 02-2012    Mild- CT scan   . GERD (gastroesophageal reflux disease)   . Cardiomyopathy 2004    mild; EF 47%/H&P 10/24/2012  . Coronary artery disease   . Anginal pain     "probably had it for years; worse recently" (10/27/2012)  . Childhood asthma   . Iron deficiency anemia 1980's  . Cluster headaches 1985    Mid 80's    Past Surgical History  Procedure Date  . Cardiac catheterization 10/27/2012  . Radial keratotomy 1985    bilaterally    Family History  Problem Relation Age of Onset  . Lung cancer Father   . Diabetes Mellitus II Paternal Grandmother     Social History:  reports that he has quit smoking. His smoking use included Cigarettes. He has a .18 pack-year smoking history. He has never used smokeless tobacco. He reports that he drinks alcohol. He reports that he uses illicit drugs (Marijuana and "Crack" cocaine).  Allergies:  Allergies  Allergen Reactions  . Penicillins Anxiety and Other (See Comments)    "made me feel like gravity had increased and it was pulling me down" (10/27/2012)    Medications:  Prior to Admission:    Prescriptions prior to admission  Medication Sig Dispense Refill  . aspirin 81 MG tablet Take 81 mg by mouth daily.      . cholecalciferol (VITAMIN D) 1000 UNITS tablet Take 1,000 Units by mouth daily.      . dexlansoprazole (DEXILANT) 60 MG capsule Take 1 capsule (60 mg total) by mouth daily.  20 capsule  0  . hydrOXYzine (ATARAX/VISTARIL) 10 MG tablet Take 10 mg by mouth daily.      . metoprolol tartrate (LOPRESSOR) 25 MG tablet Take 0.5 tablets (12.5 mg total) by mouth 2 (two) times daily.  60 tablet  3  . omega-3 acid ethyl esters (LOVAZA) 1 G capsule Take 1 g by mouth daily.      . OVER THE COUNTER MEDICATION Take 2 tablets by mouth daily. Ageless Male      . triamcinolone ointment (KENALOG) 0.1 % Apply 1 application topically daily as needed.      . vitamin B-12 (CYANOCOBALAMIN) 1000 MCG tablet Take 1,000 mcg by mouth daily.        No results found for this or any previous visit (from the past 48 hour(s)).  Dg Chest 2 View  10/27/2012  *RADIOLOGY REPORT*  Clinical Data: Precardiac catheterization  CHEST - 2 VIEW  Comparison: 06/26/2008  Findings: The heart size and mediastinal contours are within normal limits.    Both lungs are clear.  The visualized skeletal structures are unremarkable.  IMPRESSION: No active cardiopulmonary abnormalities.   Original Report Authenticated By: Bradley Watson, M.D.     Review of Systems  Constitutional: Positive for malaise/fatigue. Negative for fever, chills and weight loss.  Cardiovascular: Positive for chest pain. Negative for orthopnea, claudication, leg swelling and PND.  Gastrointestinal: Positive for heartburn.  All other systems reviewed and are negative.   Blood pressure 134/71, pulse 60, temperature 97.7 F (36.5 C), temperature source Oral, resp. rate 15, height 6' 1" (1.854 m), weight 290 lb (131.543 kg), SpO2 97.00%. Physical Exam  Vitals reviewed. Constitutional: He is oriented to person, place, and time. He appears well-developed and  well-nourished. No distress.  HENT:  Head: Normocephalic and atraumatic.  Eyes: EOM are normal. Pupils are equal, round, and reactive to light.  Neck: Neck supple. No thyromegaly present.       No bruits  Cardiovascular: Normal rate, regular rhythm, normal heart sounds and intact distal pulses.  Exam reveals no gallop and no friction rub.   No murmur heard. Respiratory: Breath sounds normal. He has no wheezes. He has no rales.  GI: Soft. There is no tenderness.  Musculoskeletal: Normal range of motion. He exhibits no edema.  Lymphadenopathy:    He has no cervical adenopathy.  Neurological: He is alert and oriented to person, place, and time. No cranial nerve deficit.  Skin: Skin is warm and dry.    Assessment/Plan: 51 yo male with recent onset angina. He has severe 2 vessel CAD with an ulcerated 70% stenosis in the LAD and a total occlusion in the left circumflex compromising 2 OMs and the LPDA. CABG is the best option for revascularization.  I have discussed with the patient the general nature of the procedure, need for general anesthesia, and incisions to be used. I discussed the expected hospital stay, overall recovery and short and long term outcomes. He understands the risks include but are not limited to death, stroke, MI, DVT/PE, bleeding, possible need for transfusion, infections, cardiac arrhythmias, and other organ system dysfunction including respiratory, renal, or GI complications. We did discuss possible use of the left radial artery, although I will wait for the vascular lab assessment of the palmar arch before making a decsion about that. He accepts the risks of CABG and agrees to proceed.  Plan OR Monday 10/31/12  Bradley Watson C 10/27/2012, 5:02 PM      

## 2012-11-01 ENCOUNTER — Ambulatory Visit: Payer: Self-pay | Admitting: Gastroenterology

## 2012-11-01 ENCOUNTER — Encounter (HOSPITAL_COMMUNITY): Payer: Self-pay | Admitting: Thoracic Surgery (Cardiothoracic Vascular Surgery)

## 2012-11-01 ENCOUNTER — Inpatient Hospital Stay (HOSPITAL_COMMUNITY): Payer: PRIVATE HEALTH INSURANCE

## 2012-11-01 DIAGNOSIS — E785 Hyperlipidemia, unspecified: Secondary | ICD-10-CM

## 2012-11-01 LAB — BASIC METABOLIC PANEL
BUN: 10 mg/dL (ref 6–23)
CO2: 23 mEq/L (ref 19–32)
Chloride: 103 mEq/L (ref 96–112)
GFR calc Af Amer: >90 mL/min (ref >90)
Glucose, Bld: 134 mg/dL — ABNORMAL HIGH (ref 70–99)
Potassium: 4.1 mEq/L (ref 3.5–5.1)

## 2012-11-01 LAB — GLUCOSE, CAPILLARY
Glucose-Capillary: 108 mg/dL — ABNORMAL HIGH (ref 70–99)
Glucose-Capillary: 118 mg/dL — ABNORMAL HIGH (ref 70–99)
Glucose-Capillary: 127 mg/dL — ABNORMAL HIGH (ref 70–99)
Glucose-Capillary: 125 mg/dL — ABNORMAL HIGH (ref 70–99)
Glucose-Capillary: 126 mg/dL — ABNORMAL HIGH (ref 70–99)
Glucose-Capillary: 110 mg/dL — ABNORMAL HIGH (ref 70–99)

## 2012-11-01 LAB — POCT I-STAT 3, ART BLOOD GAS (G3+)
O2 Saturation: 98 %
TCO2: 26 mmol/L (ref 0–100)
pCO2 arterial: 44.5 mmHg (ref 35.0–45.0)
pH, Arterial: 7.355 (ref 7.350–7.450)
pO2, Arterial: 122 mmHg — ABNORMAL HIGH (ref 80.0–100.0)

## 2012-11-01 LAB — CBC
HCT: 30 % — ABNORMAL LOW (ref 39.0–52.0)
Hemoglobin: 10 g/dL — ABNORMAL LOW (ref 13.0–17.0)
Hemoglobin: 10.1 g/dL — ABNORMAL LOW (ref 13.0–17.0)
Platelets: 163 10*3/uL (ref 150–400)
RBC: 3.55 MIL/uL — ABNORMAL LOW (ref 4.22–5.81)
RBC: 3.55 MIL/uL — ABNORMAL LOW (ref 4.22–5.81)
WBC: 8.4 10*3/uL (ref 4.0–10.5)
WBC: 10 10*3/uL (ref 4.0–10.5)

## 2012-11-01 LAB — POCT I-STAT, CHEM 8
BUN: 11 mg/dL (ref 6–23)
Hemoglobin: 10.5 g/dL — ABNORMAL LOW (ref 13.0–17.0)
Potassium: 4 mEq/L (ref 3.5–5.1)
Sodium: 136 mEq/L (ref 135–145)
TCO2: 23 mmol/L (ref 0–100)

## 2012-11-01 LAB — CREATININE, SERUM
Creatinine, Ser: 0.91 mg/dL (ref 0.50–1.35)
GFR calc non Af Amer: >90 mL/min (ref >90)

## 2012-11-01 LAB — MAGNESIUM: Magnesium: 2.3 mg/dL (ref 1.5–2.5)

## 2012-11-01 MED ORDER — FUROSEMIDE 10 MG/ML IJ SOLN
40.0000 mg | Freq: Once | INTRAMUSCULAR | Status: AC
  Administered 2012-11-01: 40 mg via INTRAVENOUS

## 2012-11-01 MED ORDER — POTASSIUM CHLORIDE 10 MEQ/50ML IV SOLN
10.0000 meq | INTRAVENOUS | Status: AC
  Administered 2012-11-01 (×2): 10 meq via INTRAVENOUS

## 2012-11-01 MED ORDER — METOPROLOL TARTRATE 25 MG PO TABS
25.0000 mg | ORAL_TABLET | Freq: Two times a day (BID) | ORAL | Status: DC
  Administered 2012-11-01 – 2012-11-04 (×6): 25 mg via ORAL
  Filled 2012-11-01 (×8): qty 1

## 2012-11-01 MED ORDER — ENOXAPARIN SODIUM 40 MG/0.4ML ~~LOC~~ SOLN
40.0000 mg | SUBCUTANEOUS | Status: DC
  Administered 2012-11-01 – 2012-11-04 (×4): 40 mg via SUBCUTANEOUS
  Filled 2012-11-01 (×4): qty 0.4

## 2012-11-01 MED ORDER — ATORVASTATIN CALCIUM 80 MG PO TABS
80.0000 mg | ORAL_TABLET | Freq: Every day | ORAL | Status: DC
  Administered 2012-11-01 – 2012-11-03 (×3): 80 mg via ORAL
  Filled 2012-11-01 (×4): qty 1

## 2012-11-01 MED ORDER — INSULIN GLARGINE 100 UNIT/ML ~~LOC~~ SOLN
40.0000 [IU] | Freq: Every day | SUBCUTANEOUS | Status: DC
  Administered 2012-11-01 – 2012-11-02 (×2): 40 [IU] via SUBCUTANEOUS

## 2012-11-01 MED ORDER — METOPROLOL TARTRATE 25 MG/10 ML ORAL SUSPENSION
25.0000 mg | Freq: Two times a day (BID) | ORAL | Status: DC
  Filled 2012-11-01 (×4): qty 10

## 2012-11-01 MED ORDER — INSULIN ASPART 100 UNIT/ML ~~LOC~~ SOLN
0.0000 [IU] | SUBCUTANEOUS | Status: DC
  Administered 2012-11-01 (×2): 2 [IU] via SUBCUTANEOUS

## 2012-11-01 NOTE — Op Note (Signed)
NAMEMarland Kitchen  ZUKO, BISSET NO.:  [id number]  MEDICAL RECORD NO.:  [id number]  LOCATION:  2312                         FACILITY:  [facility]  PHYSICIAN:  Salvatore Decent. Dorris Fetch, M.D.DATE OF BIRTH:  07/14/1961  DATE OF PROCEDURE:  10/31/2012 DATE OF DISCHARGE:                              OPERATIVE REPORT   PREOPERATIVE DIAGNOSIS:  Severe 2-vessel coronary disease with progressive angina.  POSTOPERATIVE DIAGNOSIS:  Severe 2-vessel coronary disease with progressive angina.  PROCEDURE:  Median sternotomy, extracorporeal circulation, coronary artery bypass grafting x2 (left internal mammary artery to LAD, left radial artery to OM).  SURGEON:  Salvatore Decent. Dorris Fetch, MD  ASSISTANT:  Doree Fudge, PA  ANESTHESIA:  General.  FINDINGS:  Only 1 graftable target in the posterolateral distribution. This was a 1 mm fair quality target.  LAD good quality.  Radial artery and mammary both good quality.  LIMA to LAD anastomosis taken down and redone due to bleeding which turned out to be from a branch of the mammary artery that had avulsed proximal to the clip, weaned from bypass without difficulty.  CLINICAL NOTE:  Mr. Salehi is a 51 year old gentleman, who presented with progressive exertional anginal symptoms.  He initially thought he might have reflux and went to see Gastroenterology but was referred to Cardiology.  At catheterization, he was found to have severe 2 vessel disease with a complex 70% stenosis in the LAD as well as more chronic appearing total occlusion in the left circumflex with vessels filling via both left-to-left and left-to-right collaterals.  He was advised to have coronary artery bypass grafting.  The indications, risks, benefits, and alternatives were discussed in detail with the patient.  He understood and accepted the risks and agreed to proceed.  OPERATIVE NOTE:  Mr. Rupard was brought to the preoperative holding area on October 31, 2012.   There Anesthesia placed a Swan-Ganz catheter and an arterial blood pressure monitoring line.  He was taken to the operating room, anesthetized, and intubated.  Intravenous antibiotics were administered.  A Foley catheter was placed.  The chest, abdomen, legs and left arm were prepped and draped in the usual sterile fashion. An incision was made over the volar aspect of the left wrist.  It was carried through the skin and subcutaneous tissue.  The fascia overlying the radial artery was incised and a short length of the radial was mobilized.  With proximal occlusion, there was a good Doppler signal and palpable pulse distally confirming the results of the preoperative Allen's testing.  The incision then was extended just below the antecubital fossa and the radial artery was harvested using the Harmonic Scalpel.  Simultaneously with this, a median sternotomy was performed and the left internal mammary artery was harvested using standard technique.  Both the radial artery and mammary were good quality vessels.  Heparin 2000 units was administered during the vessel harvest.  Remainder of the full heparin dose was given after closing the arm incision after the radial artery had been harvested.  The left arm then was tucked back at the patient's side.  The pericardium was opened.  The ascending aorta was of normal size and Soft with no evidence of atherosclerotic disease.  After confirming adequate anticoagulation with ACT measurement, the aorta was cannulated via concentric 2-0 Ethibond pledgeted pursestring sutures.  A dual-stage venous cannula was placed via pursestring suture in the right appendage. Cardiopulmonary bypass was instituted and the patient was cooled to 32 degrees Celsius.  The coronary arteries were inspected.  Inspection of the posterior lateral wall revealed only a single graftable target vessel.  This was a 1 mm fair quality target.  There were other smaller branches  nearby but these were not suitable for grafting.  The LAD was superficially intramyocardial but was a good quality vessel.  The conduits were inspected and cut to length.  A foam pad was placed in the pericardium to insulate the heart and protect left phrenic nerve.  A temperature probe was placed in myocardial septum and a cardioplegic cannula was placed in the ascending aorta.  The aorta was crossclamped.  The left ventricle was emptied via the aortic root vent.  Cardiac arrest then was achieved with combination of cold antegrade blood cardioplegia and topical iced saline.  One liter of cardioplegia was administered.  There was a rapid diastolic arrest to myocardial septal cooling to 9 degrees Celsius.  The following distal anastomoses were performed.  First the distal end of the left radial artery was beveled and was anastomosed end-to-side to the OM.  This again was a 1 mm fair quality target.  The radial was a good quality conduit.  The anastomosis was performed with a running 8-0 Prolene suture.  The probe passed easily proximally and distally through the anastomosis completion.  The graft was flushed and there was good flow through the graft.  Additional cardioplegia was administered via the aortic root.  The left internal mammary artery then was brought through a window in the pericardium.  The distal end was beveled.  It was then anastomosed end- to-side to the mid LAD.  The LAD was a 2 mm good quality target.  The mammary was a 2 mm good quality conduit.  The anastomosis was performed with a running 8-0 Prolene suture.  At the completion the mammary to LAD anastomosis, the bulldog clamps were briefly removed.  Immediate rapid septal rewarming was noted.  There was good hemostasis at the anastomosis.  The bulldog clamp was replaced. The mammary pedicle was tacked to the epicardial surface of the heart with 6-0 Prolene sutures.  Additional cardioplegia was administered.  The  radial artery was cut to length.  The cardioplegic cannula was removed from the ascending aorta and the proximal anastomosis was performed with a running 7-0 Prolene suture.  At the completion of the proximal anastomosis, the patient was placed in Trendelenburg position.  Lidocaine was administered.  The aortic root was de-aired.  The bulldog clamp was again removed from the left mammary artery.  The aortic crossclamp was removed and the suture was tied.  The patient required single defibrillation with 20 joules and then was in sinus rhythm.  While rewarming, the proximal and distal anastomoses were inspected for hemostasis.  On first inspection, they appeared to be fine, however, after replacing the heart back into its normal position within the pericardium, bleeding was noted from around the mammary.  The heart was again elevated and the mammary anastomosis was inspected.  An additional suture was placed at the anastomosis.  However, it was evident that the bleeding was coming from the mammary itself rather than the anastomosis.  There was an avulsion at the takeoff of a branch from the mammary artery  proximal to where it had been clipped. An attempt to repair this with a suture yielded an unacceptable result. Therefore, the decision was made to take this anastomosis down and redo it.  A cardioplegic cannula was once again placed in the ascending aorta. The aorta was crossclamped.  A bulldog was placed on the left mammary. An additional 1 L of cardioplegia was administered.  The heart was elevated.  The mammary to LAD anastomosis was taken down.  The mammary was divided just proximal to the previous segment and beveled and then anastomosed end-to-side to the LAD with a running 8-0 Prolene suture. At the completion of anastomosis, the bulldog clamp was removed. Immediate and rapid septal rewarming was noted.  There was good hemostasis. The mammary pedicle was tacked to the epicardial  surface of the heart with 6-0 Prolene sutures. The aortic cross clamp was removed. The crossclamp time was 48 minutes plus 21 minutes for the second clamp time.  Epicardial pacing wires were placed on the right ventricle and right atrium.  When the patient had warmed to 37 degrees Celsius, he was weaned from cardiopulmonary bypass on the first attempt.  The total bypass time was 119 minutes.    The initial cardiac output was greater than 5 L/minute and the patient remained hemodynamically stable throughout the postbypass.  A test dose protamine was administered and was well tolerated.  The atrial and aortic cannulae were removed.  The remainder of the protamine was administered without incident.  The chest was irrigated with warm saline.  Hemostasis was achieved.  The pericardium was reapproximated with interrupted 3-0 silk sutures.  It came together easily without tension or kinking of underlying grafts.  The left pleural and single mediastinal chest tube were placed through separate subcostal incisions. The sternum was closed with interrupted heavy gauge double stainless steel wires.  The pectoralis fascia, subcutaneous tissue, and skin were closed in standard fashion.  All sponge, needle, and instrument counts were correct at end of the procedure.  The patient was taken from the operating room to the surgical intensive care unit in good condition.     Salvatore Decent Dorris Fetch, M.D.     SCH/MEDQ  D:  10/31/2012  T:  11/01/2012  Job:  [id number]

## 2012-11-01 NOTE — Progress Notes (Signed)
1 Day Post-Op Procedure(s) (LRB): CORONARY ARTERY BYPASS GRAFTING (CABG) (N/A) RADIAL ARTERY HARVEST (Left) Subjective: C/o incisional pain in chest  Objective: Vital signs in last 24 hours: Temp:  [95.5 F (35.3 C)-101.5 F (38.6 C)] 100 F (37.8 C) (11/12 0700) Pulse Rate:  [71-95] 89  (11/12 0700) Cardiac Rhythm:  [-] Atrial paced (11/12 0400) Resp:  [12-27] 22  (11/12 0700) BP: (90-116)/(54-82) 108/69 mmHg (11/12 0700) SpO2:  [98 %-100 %] 99 % (11/12 0700) FiO2 (%):  [40 %-52.3 %] 41.2 % (11/11 1900) Weight:  [294 lb 3.2 oz (133.448 kg)] 294 lb 3.2 oz (133.448 kg) (11/12 0500)  Hemodynamic parameters for last 24 hours: PAP: (17-30)/(8-20) 30/19 mmHg CO:  [3.1 L/min-6.7 L/min] 6.7 L/min CI:  [1.3 L/min/m2-2.7 L/min/m2] 2.7 L/min/m2  Intake/Output from previous day: 11/11 0701 - 11/12 0700 In: 6943.5 [I.V.:3813.5; Blood:600; NG/GT:30; IV Piggyback:2500] Out: 7975 [Urine:5740; Emesis/NG output:100; Blood:1655; Chest Tube:480] Intake/Output this shift:    General appearance: alert and mild distress Neurologic: intact Heart: regular rate and rhythm and + rub Lungs: diminished breath sounds bibasilar  Lab Results:  Basename 11/01/12 0455 10/31/12 1932 10/31/12 1923  WBC 8.4 -- 7.7  HGB 10.0* 11.9* --  HCT 30.0* 35.0* --  PLT 159 -- 172   BMET:  Basename 11/01/12 0455 10/31/12 1932 10/31/12 0525  NA 133* 141 --  K 4.1 4.7 --  CL 103 109 --  CO2 23 -- 26  GLUCOSE 134* 116* --  BUN 10 10 --  CREATININE 0.75 0.80 --  CALCIUM 8.1* -- 9.3    PT/INR:  Basename 10/31/12 1340  LABPROT 17.4*  INR 1.47   ABG    Component Value Date/Time   PHART 7.355 10/31/2012 2110   HCO3 24.5* 10/31/2012 2110   TCO2 26 10/31/2012 2110   ACIDBASEDEF 1.0 10/31/2012 2110   O2SAT 98.0 10/31/2012 2110   CBG (last 3)   Basename 11/01/12 0645 11/01/12 0553 11/01/12 0508  GLUCAP 125* 112* 125*    Assessment/Plan: S/P Procedure(s) (LRB): CORONARY ARTERY BYPASS GRAFTING (CABG)  (N/A) RADIAL ARTERY HARVEST (Left) POD # 1 CABG x 2 CV- s/p CABG, good hemodynamics- d/c swan  RESP- bibasilar atelectasis, pulmonary toilet  RENAL- creatinine and lytes OK- diurese  ENDO - CBG OK, d/c insulin gtt  ANEMIA- secondary to ABL, follow  D/C CT  OOB, ambulate   LOS: 5 days    Bradley Watson C 11/01/2012

## 2012-11-01 NOTE — Progress Notes (Signed)
Patient Name: Bradley Watson Date of Encounter: 11/01/2012     Active Problems:  Unstable angina pectoris  Hyperlipidemia    SUBJECTIVE  Stable post op.  Alert and conversant.  Sore a bit but otherwise stable.    CURRENT MEDS    . [COMPLETED] acetaminophen  1,000 mg Intravenous Once  . acetaminophen  1,000 mg Oral Q6H   Or  . acetaminophen (TYLENOL) oral liquid 160 mg/5 mL  975 mg Per Tube Q6H  . aspirin EC  325 mg Oral Daily   Or  . aspirin  324 mg Per Tube Daily  . bisacodyl  10 mg Oral Daily   Or  . bisacodyl  10 mg Rectal Daily  . [COMPLETED] calcium chloride  1 g Intravenous Once  . Chlorhexidine Gluconate Cloth  6 each Topical Daily  . cholecalciferol  1,000 Units Oral Daily  . [COMPLETED] dexmedetomidine  0.1-0.7 mcg/kg/hr Intravenous To OR  . docusate sodium  200 mg Oral Daily  . enoxaparin (LOVENOX) injection  40 mg Subcutaneous Q24H  . famotidine (PEPCID) IV  20 mg Intravenous Q12H  . furosemide  40 mg Intravenous Once  . [COMPLETED] heparin-papaverine-plasmalyte irrigation   Irrigation To OR  . hydrOXYzine  10 mg Oral Daily  . insulin aspart  0-24 Units Subcutaneous Q4H  . insulin glargine  40 Units Subcutaneous BH-q7a  . [COMPLETED] insulin (NOVOLIN-R) infusion   Intravenous To OR  . isosorbide mononitrate  30 mg Oral Daily  . levofloxacin (LEVAQUIN) IV  750 mg Intravenous Q24H  . [COMPLETED] magnesium sulfate  4 g Intravenous Once  . metoprolol tartrate  25 mg Oral BID   Or  . metoprolol tartrate  25 mg Per Tube BID  . mupirocin ointment  1 application Nasal BID  . pantoprazole  40 mg Oral Daily  . [COMPLETED] phenylephrine (NEO-SYNEPHRINE) Adult infusion  30-200 mcg/min Intravenous To OR  . [EXPIRED] potassium chloride  10 mEq Intravenous Q1 Hr x 3  . potassium chloride  10 mEq Intravenous Q1 Hr x 2  . simvastatin  20 mg Oral q1800  . sodium chloride  3 mL Intravenous Q12H  . [COMPLETED] vancomycin  1,000 mg Intravenous Once  . [DISCONTINUED]  aspirin  81 mg Oral Daily  . [DISCONTINUED] bisacodyl  5 mg Oral Once  . [DISCONTINUED] DOPamine  2-20 mcg/kg/min Intravenous To OR  . [DISCONTINUED] epinephrine  0.5-20 mcg/min Intravenous To OR  . [DISCONTINUED] insulin regular  0-10 Units Intravenous TID WC  . [DISCONTINUED] magnesium sulfate  40 mEq Other To OR  . [DISCONTINUED] metoprolol tartrate  12.5 mg Per Tube BID  . [DISCONTINUED] metoprolol tartrate  12.5 mg Per Tube BID  . [DISCONTINUED] metoprolol tartrate  12.5 mg Oral BID  . [DISCONTINUED] metoprolol tartrate  12.5 mg Oral Once  . [DISCONTINUED] metoprolol tartrate  12.5 mg Oral BID  . [DISCONTINUED] metoprolol tartrate  12.5 mg Oral BID  . [DISCONTINUED] pantoprazole  40 mg Oral Q supper  . [DISCONTINUED] potassium chloride  80 mEq Other To OR  . [DISCONTINUED] simvastatin  20 mg Oral q1800    OBJECTIVE  Filed Vitals:   11/01/12 0400 11/01/12 0500 11/01/12 0600 11/01/12 0700  BP: 90/64 100/68 92/62 108/69  Pulse: 91 90 89 89  Temp: 100.8 F (38.2 C) 100.8 F (38.2 C) 100.6 F (38.1 C) 100 F (37.8 C)  TempSrc:      Resp: 27 25 23 22   Height:      Weight:  294 lb  3.2 oz (133.448 kg)    SpO2: 99% 99% 98% 99%    Intake/Output Summary (Last 24 hours) at 11/01/12 0820 Last data filed at 11/01/12 0700  Gross per 24 hour  Intake 6943.48 ml  Output   7975 ml  Net -1031.52 ml   Filed Weights   10/28/12 0000 10/28/12 1832 11/01/12 0500  Weight: 287 lb 4.2 oz (130.3 kg) 284 lb 2.8 oz (128.9 kg) 294 lb 3.2 oz (133.448 kg)    PHYSICAL EXAM  General: Pleasant, NAD. Neuro: Alert and oriented X 3. Moves all extremities spontaneously. Psych: Normal affect. HEENT:  Normal  Neck: Right swan ganz catheter in place.   Lungs:  Resp regular and unlabored, CTA. Heart: RRR no s3, s4, or murmurs.  No def rub.   Extremities: No clubbing, cyanosis or edema. DP/PT/Radials 2+ and equal bilaterally.  Accessory Clinical Findings  CBC  Basename 11/01/12 0455 10/31/12  1932 10/31/12 1923  WBC 8.4 -- 7.7  NEUTROABS -- -- --  HGB 10.0* 11.9* --  HCT 30.0* 35.0* --  MCV 84.5 -- 85.8  PLT 159 -- 172   Basic Metabolic Panel  Basename 11/01/12 0455 10/31/12 1932 10/31/12 1923 10/31/12 0525  NA 133* 141 -- --  K 4.1 4.7 -- --  CL 103 109 -- --  CO2 23 -- -- 26  GLUCOSE 134* 116* -- --  BUN 10 10 -- --  CREATININE 0.75 0.80 -- --  CALCIUM 8.1* -- -- 9.3  MG 2.3 -- 3.0* --  PHOS -- -- -- --   Liver Function Tests No results found for this basename: AST:2,ALT:2,ALKPHOS:2,BILITOT:2,PROT:2,ALBUMIN:2 in the last 72 hours No results found for this basename: LIPASE:2,AMYLASE:2 in the last 72 hours Cardiac Enzymes No results found for this basename: CKTOTAL:3,CKMB:3,CKMBINDEX:3,TROPONINI:3 in the last 72 hours BNP No components found with this basename: POCBNP:3 D-Dimer No results found for this basename: DDIMER:2 in the last 72 hours Hemoglobin A1C  Basename 10/30/12 1903  HGBA1C 6.1*   Fasting Lipid Panel No results found for this basename: CHOL,HDL,LDLCALC,TRIG,CHOLHDL,LDLDIRECT in the last 72 hours Thyroid Function Tests No results found for this basename: TSH,T4TOTAL,FREET3,T3FREE,THYROIDAB in the last 72 hours  TELE  Brief episode of atrial pacing.    ECG  NSR.  Inferior T inversion.  Diffuse STE consistent with pericardial process.    Radiology/Studies  Dg Chest 2 View  10/27/2012  *RADIOLOGY REPORT*  Clinical Data: Precardiac catheterization  CHEST - 2 VIEW  Comparison: 06/26/2008  Findings: The heart size and mediastinal contours are within normal limits.  Both lungs are clear.  The visualized skeletal structures are unremarkable.  IMPRESSION: No active cardiopulmonary abnormalities.   Original Report Authenticated By: Signa Kell, M.D.    Dg Chest Portable 1 View  10/31/2012  *RADIOLOGY REPORT*  Clinical Data: Status post coronary bypass grafting  PORTABLE CHEST - 1 VIEW  Comparison: 10/27/2012  Findings: The cardiac shadow is  within normal limits.  A Swan-Ganz catheter, endotracheal tube, nasogastric catheter, left thoracostomy catheter mediastinal drain are all noted in satisfactory position.  Some mild perihilar changes are noted on the left may be related to patient rotation towards the right.  No pneumothorax is seen.  No sizable effusion is noted.  IMPRESSION: Tubes and lines as described.  Questionable perihilar infiltrate on the left although this may be related the patient rotation.  Follow-up films are recommended.   Original Report Authenticated By: Alcide Clever, M.D.     ASSESSMENT AND PLAN   1.  SP CABG  for USAP 2.  Hyperlipidemia  Plan  1.  LDL is 200 at baseline--will alter dose of meds as this is unlikely to be controlled with what is currently prescribed.    Signed, [name] MD, [facility], FSCAI

## 2012-11-01 NOTE — Progress Notes (Signed)
Mediastinal and pleural CT's removed. Pt tolerated well. Gauze and wires taped. Will continue to monitor pt.

## 2012-11-01 NOTE — Anesthesia Postprocedure Evaluation (Signed)
  Anesthesia Post-op Note  Patient: Bradley Watson  Procedure(s) Performed: Procedure(s) (LRB) with comments: CORONARY ARTERY BYPASS GRAFTING (CABG) (N/A) RADIAL ARTERY HARVEST (Left)  Patient Location: SICU  Anesthesia Type:General  Level of Consciousness: awake, alert , oriented and patient cooperative  Airway and Oxygen Therapy: Patient Spontanous Breathing  Post-op Pain: mild  Post-op Assessment: Post-op Vital signs reviewed, Patient's Cardiovascular Status Stable, Respiratory Function Stable, Patent Airway, No signs of Nausea or vomiting, Adequate PO intake and Pain level controlled  Post-op Vital Signs: Reviewed and stable  Complications: No apparent anesthesia complications

## 2012-11-01 NOTE — Progress Notes (Signed)
Patient ID: MADELINE BEBOUT, male   DOB: Feb 06, 1961, 51 y.o.   MRN: [id number]  SICU Evening Rounds:  Hemodynamically stable  Urine output good  No problems today.

## 2012-11-02 ENCOUNTER — Inpatient Hospital Stay (HOSPITAL_COMMUNITY): Payer: PRIVATE HEALTH INSURANCE

## 2012-11-02 LAB — CBC
Hemoglobin: 9.4 g/dL — ABNORMAL LOW (ref 13.0–17.0)
MCH: 28.1 pg (ref 26.0–34.0)
MCHC: 33.3 g/dL (ref 30.0–36.0)
Platelets: 152 10*3/uL (ref 150–400)
RBC: 3.34 MIL/uL — ABNORMAL LOW (ref 4.22–5.81)

## 2012-11-02 LAB — BASIC METABOLIC PANEL
Calcium: 8.4 mg/dL (ref 8.4–10.5)
GFR calc Af Amer: >90 mL/min (ref >90)
GFR calc non Af Amer: >90 mL/min (ref >90)
Glucose, Bld: 128 mg/dL — ABNORMAL HIGH (ref 70–99)
Potassium: 4.2 mEq/L (ref 3.5–5.1)
Sodium: 132 mEq/L — ABNORMAL LOW (ref 135–145)

## 2012-11-02 LAB — GLUCOSE, CAPILLARY: Glucose-Capillary: 108 mg/dL — ABNORMAL HIGH (ref 70–99)

## 2012-11-02 MED ORDER — ALPRAZOLAM 0.25 MG PO TABS: 0.2500 mg | ORAL_TABLET | Freq: Four times a day (QID) | ORAL | Status: DC | PRN

## 2012-11-02 MED ORDER — ALUM & MAG HYDROXIDE-SIMETH 200-200-20 MG/5ML PO SUSP: 15.0000 mL | ORAL | Status: DC | PRN

## 2012-11-02 MED ORDER — MOVING RIGHT ALONG BOOK
Freq: Once | Status: AC
  Administered 2012-11-02: 11:00:00
  Filled 2012-11-02: qty 1

## 2012-11-02 MED ORDER — TRIAMCINOLONE ACETONIDE 0.1 % EX OINT: 1.0000 "application " | TOPICAL_OINTMENT | Freq: Every day | CUTANEOUS | Status: DC | PRN

## 2012-11-02 MED ORDER — SODIUM CHLORIDE 0.9 % IJ SOLN
3.0000 mL | Freq: Two times a day (BID) | INTRAMUSCULAR | Status: DC
  Administered 2012-11-02 – 2012-11-03 (×3): 3 mL via INTRAVENOUS

## 2012-11-02 MED ORDER — SODIUM CHLORIDE 0.9 % IJ SOLN: 3.0000 mL | INTRAMUSCULAR | Status: DC | PRN

## 2012-11-02 MED ORDER — SODIUM CHLORIDE 0.9 % IV SOLN: 250.0000 mL | INTRAVENOUS | Status: DC | PRN

## 2012-11-02 MED ORDER — FUROSEMIDE 40 MG PO TABS
40.0000 mg | ORAL_TABLET | Freq: Every day | ORAL | Status: AC
  Administered 2012-11-02 – 2012-11-04 (×3): 40 mg via ORAL
  Filled 2012-11-02 (×3): qty 1

## 2012-11-02 MED ORDER — MAGNESIUM HYDROXIDE 400 MG/5ML PO SUSP: 30.0000 mL | Freq: Every day | ORAL | Status: DC | PRN

## 2012-11-02 MED ORDER — ZOLPIDEM TARTRATE 5 MG PO TABS: 5.0000 mg | ORAL_TABLET | Freq: Every evening | ORAL | Status: DC | PRN

## 2012-11-02 MED ORDER — LEVALBUTEROL HCL 0.63 MG/3ML IN NEBU
0.6300 mg | INHALATION_SOLUTION | Freq: Four times a day (QID) | RESPIRATORY_TRACT | Status: DC | PRN
  Filled 2012-11-02: qty 3

## 2012-11-02 MED ORDER — GUAIFENESIN-DM 100-10 MG/5ML PO SYRP: 15.0000 mL | ORAL_SOLUTION | ORAL | Status: DC | PRN

## 2012-11-02 MED ORDER — OMEGA-3-ACID ETHYL ESTERS 1 G PO CAPS
1.0000 g | ORAL_CAPSULE | Freq: Every day | ORAL | Status: DC
  Administered 2012-11-02 – 2012-11-04 (×3): 1 g via ORAL
  Filled 2012-11-02 (×4): qty 1

## 2012-11-02 MED ORDER — POTASSIUM CHLORIDE CRYS ER 20 MEQ PO TBCR
20.0000 meq | EXTENDED_RELEASE_TABLET | Freq: Two times a day (BID) | ORAL | Status: DC
  Administered 2012-11-02 – 2012-11-04 (×5): 20 meq via ORAL
  Filled 2012-11-02 (×6): qty 1

## 2012-11-02 MED FILL — Potassium Chloride Inj 2 mEq/ML: INTRAVENOUS | Qty: 40 | Status: AC

## 2012-11-02 MED FILL — Magnesium Sulfate Inj 50%: INTRAMUSCULAR | Qty: 10 | Status: AC

## 2012-11-02 NOTE — Progress Notes (Signed)
2 Days Post-Op Procedure(s) (LRB): CORONARY ARTERY BYPASS GRAFTING (CABG) (N/A) RADIAL ARTERY HARVEST (Left) Subjective: Still having incisional pain, otherwise feels well  Objective: Vital signs in last 24 hours: Temp:  [98.4 F (36.9 C)-99.9 F (37.7 C)] 99 F (37.2 C) (11/13 0800) Pulse Rate:  [71-90] 73  (11/13 0700) Cardiac Rhythm:  [-] Normal sinus rhythm (11/13 0745) Resp:  [18-27] 21  (11/13 0700) BP: (81-135)/(44-75) 91/54 mmHg (11/13 0700) SpO2:  [90 %-99 %] 94 % (11/13 0700) Weight:  [294 lb 5 oz (133.5 kg)] 294 lb 5 oz (133.5 kg) (11/13 0500)  Hemodynamic parameters for last 24 hours: PAP: (25-32)/(12-17) 25/12 mmHg  Intake/Output from previous day: 11/12 0701 - 11/13 0700 In: 1521 [P.O.:840; I.V.:431; IV Piggyback:250] Out: 1460 [Urine:1435; Chest Tube:25] Intake/Output this shift:    General appearance: alert and no distress Neurologic: intact Heart: regular rate and rhythm Lungs: diminished breath sounds bibasilar  Lab Results:  Basename 11/02/12 0410 11/01/12 1723 11/01/12 1700  WBC 10.2 -- 10.0  HGB 9.4* 10.5* --  HCT 28.2* 31.0* --  PLT 152 -- 163   BMET:  Basename 11/02/12 0410 11/01/12 1723 11/01/12 0455  NA 132* 136 --  K 4.2 4.0 --  CL 99 101 --  CO2 26 -- 23  GLUCOSE 128* 127* --  BUN 13 11 --  CREATININE 0.97 1.00 --  CALCIUM 8.4 -- 8.1*    PT/INR:  Basename 10/31/12 1340  LABPROT 17.4*  INR 1.47   ABG    Component Value Date/Time   PHART 7.355 10/31/2012 2110   HCO3 24.5* 10/31/2012 2110   TCO2 23 11/01/2012 1723   ACIDBASEDEF 1.0 10/31/2012 2110   O2SAT 98.0 10/31/2012 2110   CBG (last 3)   Basename 11/02/12 0815 11/02/12 0459 11/02/12 0004  GLUCAP 108* 104* 90    Assessment/Plan: S/P Procedure(s) (LRB): CORONARY ARTERY BYPASS GRAFTING (CABG) (N/A) RADIAL ARTERY HARVEST (Left) Plan for transfer to step-down: see transfer orders POD # 2 CABG x 2   CV- BP remains relatively soft, continue beta blocker,  Imdur(radial graft), add ACE-I if BP allows  RESP- bibasilar atelectasis- pain control, IS  RENAL- lytes, creatinine OK  ENDO- CBG OK  Anemia- mild continue to follow  ambulation   LOS: 6 days    Bradley Watson C 11/02/2012

## 2012-11-02 NOTE — Progress Notes (Signed)
Pt transferred from 2300 to 2000 after report was given to [facility] the receiving RN. Pt was stable upon transfer, chart, medications and personal belongings were transferred with patient. Pt's family was notified of new room and updated on patients status.

## 2012-11-03 ENCOUNTER — Inpatient Hospital Stay (HOSPITAL_COMMUNITY): Payer: PRIVATE HEALTH INSURANCE

## 2012-11-03 LAB — CBC
HCT: 30.5 % — ABNORMAL LOW (ref 39.0–52.0)
Hemoglobin: 10.5 g/dL — ABNORMAL LOW (ref 13.0–17.0)
MCH: 29 pg (ref 26.0–34.0)
MCHC: 34.4 g/dL (ref 30.0–36.0)

## 2012-11-03 LAB — BASIC METABOLIC PANEL
BUN: 13 mg/dL (ref 6–23)
CO2: 26 mEq/L (ref 19–32)
Calcium: 9.3 mg/dL (ref 8.4–10.5)
GFR calc non Af Amer: >90 mL/min (ref >90)
Glucose, Bld: 112 mg/dL — ABNORMAL HIGH (ref 70–99)
Potassium: 4.3 mEq/L (ref 3.5–5.1)

## 2012-11-03 MED ORDER — POTASSIUM CHLORIDE CRYS ER 20 MEQ PO TBCR: 20.0000 meq | EXTENDED_RELEASE_TABLET | Freq: Every day | ORAL | Status: DC

## 2012-11-03 MED ORDER — LACTULOSE 10 GM/15ML PO SOLN
20.0000 g | Freq: Once | ORAL | Status: AC
  Administered 2012-11-03: 20 g via ORAL
  Filled 2012-11-03: qty 30

## 2012-11-03 MED ORDER — OXYCODONE HCL 5 MG PO TABS: 5.0000 mg | ORAL_TABLET | ORAL | Status: DC | PRN

## 2012-11-03 MED ORDER — ISOSORBIDE MONONITRATE ER 30 MG PO TB24: 30.0000 mg | ORAL_TABLET | Freq: Every day | ORAL | Status: DC

## 2012-11-03 MED ORDER — METOPROLOL TARTRATE 25 MG PO TABS: 25.0000 mg | ORAL_TABLET | Freq: Two times a day (BID) | ORAL | Status: DC

## 2012-11-03 MED ORDER — FUROSEMIDE 40 MG PO TABS: 40.0000 mg | ORAL_TABLET | Freq: Every day | ORAL | Status: DC

## 2012-11-03 MED ORDER — ASPIRIN 325 MG PO TBEC: 325.0000 mg | DELAYED_RELEASE_TABLET | Freq: Every day | ORAL | Status: DC

## 2012-11-03 MED FILL — Sodium Chloride Irrigation Soln 0.9%: Qty: 3000 | Status: AC

## 2012-11-03 MED FILL — Sodium Chloride IV Soln 0.9%: INTRAVENOUS | Qty: 1000 | Status: AC

## 2012-11-03 MED FILL — Sodium Bicarbonate IV Soln 8.4%: INTRAVENOUS | Qty: 50 | Status: AC

## 2012-11-03 MED FILL — Heparin Sodium (Porcine) Inj 1000 Unit/ML: INTRAMUSCULAR | Qty: 30 | Status: AC

## 2012-11-03 MED FILL — Lidocaine HCl IV Inj 20 MG/ML: INTRAVENOUS | Qty: 10 | Status: AC

## 2012-11-03 MED FILL — Mannitol IV Soln 20%: INTRAVENOUS | Qty: 500 | Status: AC

## 2012-11-03 MED FILL — Heparin Sodium (Porcine) Inj 1000 Unit/ML: INTRAMUSCULAR | Qty: 10 | Status: AC

## 2012-11-03 MED FILL — Electrolyte-R (PH 7.4) Solution: INTRAVENOUS | Qty: 4000 | Status: AC

## 2012-11-03 NOTE — Progress Notes (Addendum)
                   [address]            [place] [address]          [phone]      3 Days Post-Op Procedure(s) (LRB): CORONARY ARTERY BYPASS GRAFTING (CABG) (N/A) RADIAL ARTERY HARVEST (Left)  Subjective: Patient passing flatus but no bowel movement yet  Objective: Vital signs in last 24 hours: Temp:  [98.8 F (37.1 C)-100.9 F (38.3 C)] 98.9 F (37.2 C) (11/14 0432) Pulse Rate:  [74-94] 89  (11/14 0432) Cardiac Rhythm:  [-] Normal sinus rhythm (11/13 2115) Resp:  [16-26] 16  (11/14 0432) BP: (101-134)/(59-74) 126/71 mmHg (11/14 0432) SpO2:  [94 %-100 %] 94 % (11/14 0432)  Pre op weight 129 kg Current Weight  11/02/12 294 lb 5 oz (133.5 kg)     Intake/Output from previous day: 11/13 0701 - 11/14 0700 In: 253 [P.O.:250; I.V.:3] Out: -    Physical Exam:  Cardiovascular: RRR, no murmurs, gallops, or rubs. Pulmonary: Diminished at bases L>R; no rales, wheezes, or rhonchi. Abdomen: Soft, non tender, bowel sounds present. Extremities: Mild bilateral lower extremity edema. Wounds: Clean and dry.  No erythema or signs of infection. LUE motor/sensory intact  Lab Results: CBC: Basename 11/03/12 0423 11/02/12 0410  WBC 10.2 10.2  HGB 10.5* 9.4*  HCT 30.5* 28.2*  PLT 174 152   BMET:  Basename 11/03/12 0423 11/02/12 0410  NA 135 132*  K 4.3 4.2  CL 97 99  CO2 26 26  GLUCOSE 112* 128*  BUN 13 13  CREATININE 0.92 0.97  CALCIUM 9.3 8.4    PT/INR:  Lab Results  Component Value Date   INR 1.47 10/31/2012   INR 1.2* 10/25/2012   ABG:  INR: Will add last result for INR, ABG once components are confirmed Will add last 4 CBG results once components are confirmed  Assessment/Plan:  1. CV - SR. On Lopressor 25 bid and Imdur 30 daily. 2.  Pulmonary - CXR this am shows small bilateral pleural effusions, no ptx, and left basilar atelectasis.Encourage incentive spirometer and flutter valve. 3. Volume Overload - Continue with diuresis. 4.   Acute blood loss anemia - H and H increased to 10.5 and 30.5. 5.Continue with CRPI 6.Remove EPW and CT sutures in am 7.LOC constipation 8.Possible discharge in am  ZIMMERMAN,DONIELLE MPA-C 11/03/2012,7:54 AM    Patient seen and examined. Agree with above Probably home in AM

## 2012-11-03 NOTE — Discharge Summary (Signed)
Physician Discharge Summary  Patient ID: Bradley Watson MRN: [id number] DOB/AGE: 1961-04-19 51 y.o.  Admit date: 10/27/2012 Discharge date: 11/04/2012  Admission Diagnoses:  Discharge Diagnoses:       Procedure (s):  1.Cardiac catheterization done by Dr. Riley Kill on 10/27/2012: Coronary angiography:  Coronary dominance: right  Left mainstem: Free of significant disease  Left anterior descending (LAD): Large caliber vessel that courses to and wraps the apex. It also supplies collaterals to the dominant CFX system. There is a hazy 70% lesion in the proximal vessel. The distal vessel wraps the apex and has modest luminal irregularity.  Left circumflex (LCx): Dominant vessel. There is a large ramus with mild irregularity. The AV circ is totally occluded after a marginal branch. The marginal has 80% proximal lesion just before the total occlusion. The distal cfx is collateralized by the the LAD and non dominant RCA  Right coronary artery (RCA): Non dominant without significant obstruction. Collaterals present, well formed, to the distal CFX, two moderate branches.  Left ventriculography: Left ventricular systolic function is reduced, LVEF is estimated at 50%, there is no significant mitral regurgitation. There is inferobasal hypokinesis.   2.Median sternotomy, extracorporeal circulation, coronary  artery bypass grafting x2 (left internal mammary artery to LAD, left  radial artery to OM) by Dr. Dorris Fetch on 10/31/2012.  History of Presenting Illness: This is a 51 yo [place] American male with no prior cardiac history. He presented with recent onset chest pain. He described this as a pressure sensation, usually but not always with exertion. He has noted he has this fairly regularly when walking 40-50 yards up an incline when going to work. He says the pain is accompanied by SOB and is relieved by rest. He had similar symptoms back in 2004, but test showed no problems with his heart.  He  initially thought he had reflux and went to see a gastroenterologist. There was concern it was angina and he was referrred to Dr. Riley Kill. He underwent cardiac catheterization on 10/27/2112. 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 A cardiothoracic consultation was obtained with Dr. Dorris Fetch for the consideration of coronary artery bypass grafting surgery. Potential risks, benefits, and complications were discussed and he agreed to proceed. 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 He underwent a CABG x 2 on 10/31/2012.  Brief Hospital Course:  He was extubated without difficulty the evening of surgery. He remained afebrile and hemodynamically stable. His Theone Murdoch, a line, chest tubes, and foley were all removed early in his post operative course. He did have ABL anemia. He did not require a transfusion. His last H and H was up to 10.5 and 30.5. He was started on Lopressor which was titrated accordingly. He was volume overloaded and diuresed. He was felt surgically stable for transfer from the ICU to 2000 for further convalescence on 11/02/2012. He has been tolerating a diet. He will be given a laxative to help with a bowel movement. He has been ambulating fairly well on room air. His epicardial pacing wires and chest tube sutures will be removed in the morning. Provided he remains afebrile, hemodynamically stable, and pending morning round evaluation, he will be surgically stable for discharge on 11/04/2012.    Latest Vital Signs: Blood pressure 126/71, pulse 89, temperature 98.9 F (37.2 C), temperature source Oral, resp. rate 16, height 6' (1.829 m), weight 294 lb 5 oz (133.5 kg), SpO2 94.00%.  Physical  Exam: Cardiovascular: RRR, no murmurs, gallops, or rubs.  Pulmonary: Diminished at bases L>R; no rales, wheezes, or rhonchi.  Abdomen: Soft, non tender, bowel sounds present.  Extremities: Mild  bilateral lower extremity edema.  Wounds: Clean and dry. No erythema or signs of infection.  LUE motor/sensory intact   Discharge Condition:Stable  Recent laboratory studies:  Lab Results  Component Value Date   WBC 10.2 11/03/2012   HGB 10.5* 11/03/2012   HCT 30.5* 11/03/2012   MCV 84.3 11/03/2012   PLT 174 11/03/2012   Lab Results  Component Value Date   NA 135 11/03/2012   K 4.3 11/03/2012   CL 97 11/03/2012   CO2 26 11/03/2012   CREATININE 0.92 11/03/2012   GLUCOSE 112* 11/03/2012      Diagnostic Studies: Dg Chest 2 View  11/03/2012  *RADIOLOGY REPORT*  Clinical Data: Status post coronary Bypass grafting  CHEST - 2 VIEW  Comparison: 11/02/2012  Findings: The cardiac shadow is stable.  The right-sided jugular catheter has been removed in the interval.  Persistent left basilar atelectasis is seen.  Small pleural effusions are noted bilaterally.  No pneumothorax is seen.  IMPRESSION: The overall appearance is stable from previous day.   Original Report Authenticated By: Alcide Clever, M.D.     Discharge Orders    Future Appointments: Provider: Department: Dept Phone: Center:   11/29/2012 9:45 AM Loreli Slot, MD [facility] [phone] [facility]      Discharge Medications:   Medication List     As of 11/03/2012  9:06 AM    STOP taking these medications         aspirin 81 MG tablet      TAKE these medications         aspirin 325 MG EC tablet   Take 1 tablet (325 mg total) by mouth daily.      cholecalciferol 1000 UNITS tablet   Commonly known as: VITAMIN D   Take 1,000 Units by mouth daily.      dexlansoprazole 60 MG capsule   Commonly known as: DEXILANT   Take 1 capsule (60 mg total) by mouth daily.      furosemide 40 MG tablet   Commonly known as: LASIX   Take 1 tablet (40 mg total) by mouth daily. For 5 days then stop.      hydrOXYzine 10 MG tablet   Commonly known as: ATARAX/VISTARIL   Take 10 mg by  mouth daily.      isosorbide mononitrate 30 MG 24 hr tablet   Commonly known as: IMDUR   Take 1 tablet (30 mg total) by mouth daily. For one month then stop.      metoprolol tartrate 25 MG tablet   Commonly known as: LOPRESSOR   Take 1 tablet (25 mg total) by mouth 2 (two) times daily.      omega-3 acid ethyl esters 1 G capsule   Commonly known as: LOVAZA   Take 1 g by mouth daily.      OVER THE COUNTER MEDICATION   Take 2 tablets by mouth daily. Ageless Male      oxyCODONE 5 MG immediate release tablet   Commonly known as: Oxy IR/ROXICODONE   Take 1-2 tablets (5-10 mg total) by mouth every 4 (four) hours as needed for pain.      potassium chloride SA 20 MEQ tablet   Commonly known as: K-DUR,KLOR-CON   Take 1 tablet (20 mEq total) by mouth daily. For  5 days then stop.      triamcinolone ointment 0.1 %   Commonly known as: KENALOG   Apply 1 application topically daily as needed.      vitamin B-12 1000 MCG tablet   Commonly known as: CYANOCOBALAMIN   Take 1,000 mcg by mouth daily.       The patient has been discharged on:   1.Beta Blocker:  Yes [  x ]                              No   [   ]                              If No, reason:  2.Ace Inhibitor/ARB: Yes [   ]                                     No  [  x  ]                                     If No, reason:BP labile  3.Statin:   Yes [  x ]                  No  [   ]                  If No, reason:  4.Ecasa:  Yes  [  x ]                  No   [   ]                  If No, reason:  Follow Up Appointments:     Follow-up Information    Follow up with [name], MD. (Call for a follow up appointment for 2 weeks)    Contact information:   [address] [place] [address] [phone]       Follow up with Loreli Slot, MD. (PA/LAT CXR to be taken (at [facility] which is in the same building as Dr. Sunday Corn office) on 11/29/2012 at 8:45  am;Appointment with Dr. Dorris Fetch is on 11/29/2012 at 9:45 am)    Contact information:   [address] [place] [address] [phone]       Follow up with Pola Corn, MD. (Call for a follow up regarding HGA1C 6.1)    Contact information:   [address] [place] [address] [phone]          Signed: Doree Fudge MPA-C 11/03/2012, 9:06 AM

## 2012-11-03 NOTE — Progress Notes (Signed)
Pt ambulated 1250 ft with walker on rm air, pt tolerated activity well, now resting in chair. Will continue to monitor.   Vantasia Pinkney M

## 2012-11-03 NOTE — Progress Notes (Signed)
CARDIAC REHAB PHASE I   PRE:  Rate/Rhythm: 80 SR    BP: sitting 110/80 right forearm manual    SaO2: 89-90  RA  MODE:  Ambulation: 550 ft   POST:  Rate/Rhythm: 109    BP: sitting 110/76 right forearm manual    SaO2: 93 RA  Tolerated well. SaO2 better with ambulation. To check if he needs RW. Interested in [facility] and will send referral to [place] [facility].  [id number]  Harriet Masson CES, ACSM

## 2012-11-03 NOTE — Progress Notes (Signed)
Pt tolerated ambulation in hallway with rolling walker approx 500 ft.Bradley Watson

## 2012-11-04 NOTE — Progress Notes (Signed)
Pt EPW p[ulled per protocol and as ordered. Pt tolerated well, all ends intact. Pt reminded to lie supine approximatley one hour. bp 139/67 CT sutures dcd also and steri strips applied. Will continue to monitor patient. Rosemarie Galvis, Randall An RN

## 2012-11-04 NOTE — Progress Notes (Signed)
4 Days Post-Op Procedure(s) (LRB): CORONARY ARTERY BYPASS GRAFTING (CABG) (N/A) RADIAL ARTERY HARVEST (Left) Subjective:  Bradley Watson has no complaints this morning.  He is ready to be discharged home today.  +BM  Objective: Vital signs in last 24 hours: Temp:  [98.9 F (37.2 C)-99.8 F (37.7 C)] 99.8 F (37.7 C) (11/15 0554) Pulse Rate:  [77-92] 92  (11/15 0554) Cardiac Rhythm:  [-] Normal sinus rhythm (11/14 2015) Resp:  [18-19] 19  (11/15 0554) BP: (100-126)/(59-83) 126/83 mmHg (11/15 0554) SpO2:  [93 %-98 %] 98 % (11/15 0554) Weight:  [288 lb 8 oz (130.863 kg)] 288 lb 8 oz (130.863 kg) (11/15 0528)  Intake/Output from previous day: 11/14 0701 - 11/15 0700 In: -  Out: 502 [Urine:500; Stool:2]  General appearance: alert, cooperative and no distress Neurologic: intact Heart: regular rate and rhythm Lungs: clear to auscultation bilaterally Abdomen: soft, non-tender; bowel sounds normal; no masses,  no organomegaly Extremities: edema trace Wound: clean and dry  Lab Results:  [facility] 11/03/12 0423 11/02/12 0410  WBC 10.2 10.2  HGB 10.5* 9.4*  HCT 30.5* 28.2*  PLT 174 152   BMET:  Basename 11/03/12 0423 11/02/12 0410  NA 135 132*  K 4.3 4.2  CL 97 99  CO2 26 26  GLUCOSE 112* 128*  BUN 13 13  CREATININE 0.92 0.97  CALCIUM 9.3 8.4    PT/INR: No results found for this basename: LABPROT,INR in the last 72 hours ABG    Component Value Date/Time   PHART 7.355 10/31/2012 2110   HCO3 24.5* 10/31/2012 2110   TCO2 23 11/01/2012 1723   ACIDBASEDEF 1.0 10/31/2012 2110   O2SAT 98.0 10/31/2012 2110   CBG (last 3)   Basename 11/02/12 0815 11/02/12 0459 11/02/12 0004  GLUCAP 108* 104* 90    Assessment/Plan: S/P Procedure(s) (LRB): CORONARY ARTERY BYPASS GRAFTING (CABG) (N/A) RADIAL ARTERY HARVEST (Left)  1. CV- NSR continue Lopressor and Imdur (Radial Artery) 2. Pulm- no acute issues, small bilateral effusions, continue IS/Flutter Valve at discharge 3. Volume  Overload- on Lasix 4. LOC Constipation- resolved 5. Dispo- patient doing well, will d/c EPW this morning, no issues will d/c home today   LOS: 8 days    Watson, Bradley 11/04/2012

## 2012-11-04 NOTE — Progress Notes (Signed)
[id number] Education completed with pt and wife. Understanding voiced. Referring to [facility] Phase 2. Justene Jensen DunlapRN

## 2012-11-04 NOTE — Progress Notes (Signed)
Patient given discharge instructions and prescriptions. All questions answered. Will discharge home with wife. Tammela Bales, Randall An RN

## 2012-11-04 NOTE — Care Management Note (Signed)
    Page 1 of 1   11/04/2012     2:19:05 PM   CARE MANAGEMENT NOTE 11/04/2012  Patient:  KOHYN, PANCIERA   Account Number:  [id number]  Date Initiated:  10/31/2012  Documentation initiated by:  Avie Arenas  Subjective/Objective Assessment:   CP - now11-11-13 post op CABG x2  has Spouse.     Action/Plan:   WIFE TO PROVIDE 24HR CARE AT DC.   Anticipated DC Date:  11/04/2012   Anticipated DC Plan:  HOME W HOME HEALTH SERVICES      DC Planning Services  CM consult      Choice offered to / List presented to:             Status of service:  Completed, signed off Medicare Important Message given?   (If response is "NO", the following Medicare IM given date fields will be blank) Date Medicare IM given:   Date Additional Medicare IM given:    Discharge Disposition:  HOME/SELF CARE  Per UR Regulation:  Reviewed for med. necessity/level of care/duration of stay  If discussed at Long Length of Stay Meetings, dates discussed:    Comments:  ContactPaydon, [place] [phone]   [phone]  11/04/12 Geanna Divirgilio,RN,BSN [phone] PT DISCHARGING HOME TODAY WITH SPOUSE.  AMBULATING WELL. STATES HAS RW AT HOME, IF NEEDED.

## 2012-11-11 ENCOUNTER — Telehealth: Payer: Self-pay

## 2012-11-11 DIAGNOSIS — G8918 Other acute postprocedural pain: Secondary | ICD-10-CM

## 2012-11-11 MED ORDER — HYDROCODONE-ACETAMINOPHEN 5-325 MG PO TABS: 1.0000 | ORAL_TABLET | Freq: Four times a day (QID) | ORAL | Status: DC | PRN

## 2012-11-11 NOTE — Telephone Encounter (Signed)
RX for Norco 3/325 mg 1 tab po every 6-8 hours prn/ pain #40/0 refill called to pt's pharm. Pt is aware

## 2012-11-16 ENCOUNTER — Encounter: Payer: Self-pay | Admitting: *Deleted

## 2012-11-21 ENCOUNTER — Ambulatory Visit (INDEPENDENT_AMBULATORY_CARE_PROVIDER_SITE_OTHER): Payer: PRIVATE HEALTH INSURANCE | Admitting: Physician Assistant

## 2012-11-21 ENCOUNTER — Encounter: Payer: Self-pay | Admitting: Physician Assistant

## 2012-11-21 VITALS — BP 122/80 | HR 63 | Ht 72.0 in | Wt 278.2 lb

## 2012-11-21 DIAGNOSIS — R079 Chest pain, unspecified: Secondary | ICD-10-CM

## 2012-11-21 DIAGNOSIS — I2510 Atherosclerotic heart disease of native coronary artery without angina pectoris: Secondary | ICD-10-CM

## 2012-11-21 DIAGNOSIS — E785 Hyperlipidemia, unspecified: Secondary | ICD-10-CM

## 2012-11-21 MED ORDER — ATORVASTATIN CALCIUM 40 MG PO TABS: 40.0000 mg | ORAL_TABLET | Freq: Every day | ORAL | Status: DC

## 2012-11-21 NOTE — Patient Instructions (Addendum)
START LIPITOR 40 MG DAILY; RX SENT IN TODAY TO [facility] [address]  01/16/13 9:15 AM FOR FASTING LIPID AND LIVER PANEL; NOTHING TO EAT OR DRINK AFTER MIDNIGHT THE NIGHT BEFORE LABS  You have been referred to CARDIAC REHAB TO BE DONE @ [facility]  Your physician recommends that you schedule a follow-up appointment in: 6 WEEKS WITH DR. Riley Kill

## 2012-11-21 NOTE — Progress Notes (Addendum)
[address], [address] [place], [place]  [address] Phone: [phone], Fax:  [phone]  Date:  11/21/2012   Name:  Bradley Watson   DOB:  [birth year]   MRN:  [id number]  PCP:  Pola Corn, MD  Primary Cardiologist:  Dr.  [name]  Primary Electrophysiologist:  None    History of Present Illness: Bradley Watson is a 51 y.o. male who returns for follow up after recent admission to the hospital for CABG.  He was evaluated by Dr. Riley Kill 10/25/12 with symptoms consistent with class III angina. LHC 10/27/12 demonstrated pLAD 70%, AV CFX occluded, pOM 80%, dCFX collateralized the LAD and RCA, RCA without obstructive lesions, EF 50% with inferobasal HK.  He was referred for CABG.  CABG was performed 10/31/12 with Dr. Dorris Fetch:  L-LAD, left radial-OM.  Pre-CABG Dopplers: RICA 40-59%. Echo 10/30/12: EF 55-60%, grade 1 diastolic dysfunction, mild LAE, normal LV wall motion. Postoperative course was fairly uneventful.  Since discharge, he has been slow to progress. He does note dyspnea and chest pressure with exertion. This is completely unlike his anginal symptoms. Those symptoms have resolved. His chest is still sore. He has an area of numbness in his left chest. He is having difficulty sleeping. His appetite is not that good. He denies orthopnea, PND or edema. He denies syncope. He denies fevers or chills. Denies significant cough. Chest wall pain from his incision seems to be quite bothersome and he admits to taking oxycodone regularly to control his symptoms.  Labs (11/13):   K 4.3, creatinine 0.92, LDL 200, Hgb 10.5  Wt Readings from Last 3 Encounters:  11/21/12 278 lb 3.2 oz (126.191 kg)  11/04/12 288 lb 8 oz (130.863 kg)  11/04/12 288 lb 8 oz (130.863 kg)     Past Medical History  Diagnosis Date  . Diverticulitis 02-2012    CT scan   . Fatty liver 02-2012    Mild- CT scan   . GERD (gastroesophageal reflux disease)   . Coronary artery disease     a.   LHC 10/27/12 demonstrated pLAD 70%, AV CFX occluded, pOM 80%, dCFX collateralized the LAD and RCA, RCA without obstructive lesions, EF 50% with inferobasal HK;  b.  Echo 10/30/12: EF 55-60%, grade 1 diastolic dysfunction, mild LAE, normal LV wall motion;  c. s/p CABG 10/31/12 with Dr. Dorris Fetch:  L-LAD, left radial-OM. Marland Kitchen  . Childhood asthma   . Iron deficiency anemia 1980's  . Cluster headaches 1985    Mid 80's  . HLD (hyperlipidemia)   . Carotid stenosis     Pre-CABG Dopplers: RICA 40-59%    Current Outpatient Prescriptions  Medication Sig Dispense Refill  . HYDROcodone-acetaminophen (NORCO) 5-325 MG per tablet Take 1 tablet by mouth every 6 (six) hours as needed for pain.  40 tablet  0  . isosorbide mononitrate (IMDUR) 30 MG 24 hr tablet Take 1 tablet (30 mg total) by mouth daily. For one month then stop.  30 tablet  0  . metoprolol tartrate (LOPRESSOR) 25 MG tablet Take 1 tablet (25 mg total) by mouth 2 (two) times daily.  60 tablet  1  . aspirin EC 325 MG EC tablet Take 1 tablet (325 mg total) by mouth daily.  30 tablet    . atorvastatin (LIPITOR) 40 MG tablet Take 1 tablet (40 mg total) by mouth daily.  90 tablet  3  . cholecalciferol (VITAMIN D) 1000 UNITS tablet Take 1,000 Units by mouth daily.      Marland Kitchen  dexlansoprazole (DEXILANT) 60 MG capsule Take 1 capsule (60 mg total) by mouth daily.  20 capsule  0  . furosemide (LASIX) 40 MG tablet Take 1 tablet (40 mg total) by mouth daily. For 5 days then stop.  5 tablet  0  . hydrOXYzine (ATARAX/VISTARIL) 10 MG tablet Take 10 mg by mouth daily.      Marland Kitchen omega-3 acid ethyl esters (LOVAZA) 1 G capsule Take 1 g by mouth daily.      Marland Kitchen OVER THE COUNTER MEDICATION Take 2 tablets by mouth daily. Ageless Male      . potassium chloride SA (K-DUR,KLOR-CON) 20 MEQ tablet Take 1 tablet (20 mEq total) by mouth daily. For 5 days then stop.  5 tablet  0  . triamcinolone ointment (KENALOG) 0.1 % Apply 1 application topically daily as needed.      . vitamin B-12  (CYANOCOBALAMIN) 1000 MCG tablet Take 1,000 mcg by mouth daily.        Allergies: Allergies  Allergen Reactions  . Penicillins Anxiety and Other (See Comments)    "made me feel like gravity had increased and it was pulling me down" (10/27/2012)    Social History:  The patient  reports that he has quit smoking. His smoking use included Cigarettes. He has a .18 pack-year smoking history. He has never used smokeless tobacco. He reports that he drinks alcohol. He reports that he uses illicit drugs (Marijuana and "Crack" cocaine).   ROS:  Please see the history of present illness.   All other systems reviewed and negative.   PHYSICAL EXAM: VS:  BP 122/80  Pulse 63  Ht 6' (1.829 m)  Wt 278 lb 3.2 oz (126.191 kg)  BMI 37.73 kg/m2 Well nourished, well developed, in no acute distress HEENT: normal Neck: no JVD Cardiac:  normal S1, S2; RRR; no murmur  Chest: Median sternotomy well-healed without erythema or discharge; no audible click with external manipulation Lungs:  clear to auscultation bilaterally, no wheezing, rhonchi or rales Abd: soft, nontender, no hepatomegaly Ext: no edema Skin: warm and dry Neuro:  CNs 2-12 intact, no focal abnormalities noted  EKG:  NSR, HR 64, normal axis, nonspecific T-wave changes, no change from prior tracing      ASSESSMENT AND PLAN:  1. Coronary Artery Disease:   He is progressing slowly since his recent CABG. I believe that his chest discomfort is probably related to his incision more than anything else. His angina has completely resolved. His ECG is unchanged. He is interested in cardiac rehabilitation. I will make that referral. He will keep his followup with Dr. Dorris Fetch as scheduled next week. Continue aspirin. He is not on a statin and that will be initiated as noted below.  2. Hyperlipidemia:   Start Lipitor 40 mg daily. Obtain lipids and LFTs in 6 weeks.  3. Chest Pain:   As noted, I believe this is mainly related to his surgery. His  incision seems to have healed well. No signs of infection. Followup with Dr. Orson Aloe as scheduled. I did ask him to try to start reducing the amount of oxycodone that he does take to prevent rebound pain. He will obtain refills with Dr. Dorris Fetch.  4. Disposition:   Follow up with Dr.  [name] in 6 weeks.   Signed, Tereso Newcomer, PA-C  2:34 PM 11/21/2012

## 2012-11-23 ENCOUNTER — Other Ambulatory Visit: Payer: Self-pay | Admitting: Thoracic Surgery (Cardiothoracic Vascular Surgery)

## 2012-11-23 DIAGNOSIS — G8918 Other acute postprocedural pain: Secondary | ICD-10-CM

## 2012-11-25 ENCOUNTER — Other Ambulatory Visit: Payer: Self-pay | Admitting: *Deleted

## 2012-11-25 DIAGNOSIS — Z951 Presence of aortocoronary bypass graft: Secondary | ICD-10-CM

## 2012-11-29 ENCOUNTER — Encounter: Payer: Self-pay | Admitting: Thoracic Surgery (Cardiothoracic Vascular Surgery)

## 2012-11-29 ENCOUNTER — Ambulatory Visit
Admission: RE | Admit: 2012-11-29 | Discharge: 2012-11-29 | Disposition: A | Discharge status: Home / Self Care | Payer: PRIVATE HEALTH INSURANCE | Source: Ambulatory Visit | Attending: Thoracic Surgery (Cardiothoracic Vascular Surgery) | Admitting: Thoracic Surgery (Cardiothoracic Vascular Surgery)

## 2012-11-29 ENCOUNTER — Ambulatory Visit (INDEPENDENT_AMBULATORY_CARE_PROVIDER_SITE_OTHER): Payer: Self-pay | Admitting: Thoracic Surgery (Cardiothoracic Vascular Surgery)

## 2012-11-29 VITALS — BP 112/77 | HR 80 | Resp 18 | Ht 72.0 in | Wt 278.0 lb

## 2012-11-29 DIAGNOSIS — Z951 Presence of aortocoronary bypass graft: Secondary | ICD-10-CM

## 2012-11-29 DIAGNOSIS — I2510 Atherosclerotic heart disease of native coronary artery without angina pectoris: Secondary | ICD-10-CM

## 2012-11-29 NOTE — Patient Instructions (Signed)
Do not lift over 10 pounds until after the 1st of the year.  You may begin driving with precautions as discussed

## 2012-11-29 NOTE — Progress Notes (Signed)
HPI:  [name] returns today for postoperative followup. He had coronary bypass grafting x2 with a left internal mammary to his LAD and a left radial to an OM on 10/31/2012. His postoperative course was unremarkable. He states that since discharge she has continued to have some incisional pain. He's only been taking extra Tylenol for that, although he did have to take a Vicodin tablet last night at bedtime. He says he also had a cluster headache last night which was unusual, because he has not had one of those in about 2 years. He complains of some pain and numbness along the left side of the sternal incision. He's not feeling any clicking or popping. He does complain of or appetite and sleep disturbance.  Past Medical History  Diagnosis Date  . Diverticulitis 02-2012    CT scan   . Fatty liver 02-2012    Mild- CT scan   . GERD (gastroesophageal reflux disease)   . Coronary artery disease     a.  LHC 10/27/12 demonstrated pLAD 70%, AV CFX occluded, pOM 80%, dCFX collateralized the LAD and RCA, RCA without obstructive lesions, EF 50% with inferobasal HK;  b.  Echo 10/30/12: EF 55-60%, grade 1 diastolic dysfunction, mild LAE, normal LV wall motion;  c. s/p CABG 10/31/12 with Dr. Dorris Fetch:  L-LAD, left radial-OM. Marland Kitchen  . Childhood asthma   . Iron deficiency anemia 1980's  . Cluster headaches 1985    Mid 80's  . HLD (hyperlipidemia)   . Carotid stenosis     Pre-CABG Dopplers: RICA 40-59%      Current Outpatient Prescriptions  Medication Sig Dispense Refill  . aspirin EC 325 MG EC tablet Take 1 tablet (325 mg total) by mouth daily.  30 tablet    . atorvastatin (LIPITOR) 40 MG tablet Take 1 tablet (40 mg total) by mouth daily.  90 tablet  3  . cholecalciferol (VITAMIN D) 1000 UNITS tablet Take 1,000 Units by mouth daily.      Marland Kitchen dexlansoprazole (DEXILANT) 60 MG capsule Take 1 capsule (60 mg total) by mouth daily.  20 capsule  0  . HYDROcodone-acetaminophen (NORCO/VICODIN) 5-325 MG per  tablet take 1 tablet by mouth every 6 to 8 hours if needed for pain  40 tablet  0  . hydrOXYzine (ATARAX/VISTARIL) 10 MG tablet Take 10 mg by mouth daily.      . metoprolol tartrate (LOPRESSOR) 25 MG tablet Take 1 tablet (25 mg total) by mouth 2 (two) times daily.  60 tablet  1  . omega-3 acid ethyl esters (LOVAZA) 1 G capsule Take 1 g by mouth daily.      Marland Kitchen OVER THE COUNTER MEDICATION Take 2 tablets by mouth daily. Ageless Male      . triamcinolone ointment (KENALOG) 0.1 % Apply 1 application topically daily as needed.      . vitamin B-12 (CYANOCOBALAMIN) 1000 MCG tablet Take 1,000 mcg by mouth daily.        Physical Exam BP 112/77  Pulse 80  Resp 18  Ht 6' (1.829 m)  Wt 278 lb (126.1 kg)  BMI 37.70 kg/m2  SpO59 20% 51 year old obese male in no acute distress Neurologic alert and oriented x3 no deficits Lungs clear with equal breath sounds bilaterally Cardiac regular rate and rhythm normal S1 and S2 Sternum stable Sternal incision clean dry and intact Left arm incision healing well Neurologic intact left hand No peripheral edema  Diagnostic Tests: Chest x-ray 11/29/2012 Postoperative changes no effusions or infiltrates  Impression: [name] now about a month out from coronary bypass grafting x2. He is doing well overall. He has not had any recurrent angina. He is having some difficulty with his sleeping and with poor appetite which is common after surgery. He does have some incisional pain. He is able to control that with Tylenol most the time that I did give him a prescription for additional 40 tablets of Vicodin 5/325 one to 2 tablets 3 times daily as needed for pain. He may begin driving appropriate precautions were discussed including driving while taking narcotics and avoid high speeds and heavy traffic for the next several weeks. He is not to lift anything over 10 pounds for at least an another 3 weeks.  Plan: He'll continue to be followed by  [facility] cardiology.  I will be happy to see him back any time if I can be of any further assistance with his care.

## 2013-01-13 ENCOUNTER — Ambulatory Visit (INDEPENDENT_AMBULATORY_CARE_PROVIDER_SITE_OTHER): Payer: PRIVATE HEALTH INSURANCE | Admitting: Cardiology

## 2013-01-13 ENCOUNTER — Encounter: Payer: Self-pay | Admitting: Cardiology

## 2013-01-13 ENCOUNTER — Ambulatory Visit (INDEPENDENT_AMBULATORY_CARE_PROVIDER_SITE_OTHER)
Admission: RE | Admit: 2013-01-13 | Discharge: 2013-01-13 | Disposition: A | Discharge status: Home / Self Care | Payer: PRIVATE HEALTH INSURANCE | Source: Ambulatory Visit | Attending: Cardiology | Admitting: Cardiology

## 2013-01-13 VITALS — BP 110/73 | HR 82 | Ht 72.0 in | Wt 273.0 lb

## 2013-01-13 DIAGNOSIS — E785 Hyperlipidemia, unspecified: Secondary | ICD-10-CM

## 2013-01-13 DIAGNOSIS — I2510 Atherosclerotic heart disease of native coronary artery without angina pectoris: Secondary | ICD-10-CM

## 2013-01-13 DIAGNOSIS — J029 Acute pharyngitis, unspecified: Secondary | ICD-10-CM

## 2013-01-13 DIAGNOSIS — J028 Acute pharyngitis due to other specified organisms: Secondary | ICD-10-CM

## 2013-01-13 DIAGNOSIS — B9789 Other viral agents as the cause of diseases classified elsewhere: Secondary | ICD-10-CM

## 2013-01-13 LAB — HEPATIC FUNCTION PANEL
ALT: 17 U/L (ref 0–53)
AST: 21 U/L (ref 0–37)
Albumin: 4 g/dL (ref 3.5–5.2)
Total Bilirubin: 0.4 mg/dL (ref 0.3–1.2)
Total Protein: 8 g/dL (ref 6.0–8.3)

## 2013-01-13 LAB — LIPID PANEL
Cholesterol: 185 mg/dL (ref 0–200)
HDL: 39.8 mg/dL (ref >39.00)

## 2013-01-13 NOTE — Assessment & Plan Note (Signed)
He remains under our care.   Stable post CABG.

## 2013-01-13 NOTE — Progress Notes (Signed)
HPI:  Patient returns today in a followup visit. He has had a little bit of a rough course since I saw him last. He had migraines, and now is gotten over that he caught whatever his child had, his had a sore throat for the better part of about 2 weeks. He has a raspy voice. He's not bringing up purulent sputum. He has not had a fever.  Current Outpatient Prescriptions  Medication Sig Dispense Refill  . Acetaminophen (CHLORASEPTIC SORE THROAT PO) Take as needed      . aspirin EC 325 MG EC tablet Take 1 tablet (325 mg total) by mouth daily.  30 tablet    . atorvastatin (LIPITOR) 40 MG tablet Take 1 tablet (40 mg total) by mouth daily.  90 tablet  3  . cholecalciferol (VITAMIN D) 1000 UNITS tablet Take 1,000 Units by mouth daily.      Marland Kitchen dexlansoprazole (DEXILANT) 60 MG capsule Take 1 capsule (60 mg total) by mouth daily.  20 capsule  0  . GUAIFENESIN PO Take 400 mg by mouth daily.      Marland Kitchen HYDROcodone-acetaminophen (NORCO/VICODIN) 5-325 MG per tablet take 1 tablet by mouth every 6 to 8 hours if needed for pain  40 tablet  0  . hydrOXYzine (ATARAX/VISTARIL) 10 MG tablet Take 10 mg by mouth daily.      . metoprolol tartrate (LOPRESSOR) 25 MG tablet Take 1 tablet (25 mg total) by mouth 2 (two) times daily.  60 tablet  1  . omega-3 acid ethyl esters (LOVAZA) 1 G capsule Take 1 g by mouth daily.      Marland Kitchen OVER THE COUNTER MEDICATION Take 2 tablets by mouth daily. Ageless Male      . PHENYLEPHRINE HCL OP Take 10 mg by mouth daily.      Marland Kitchen triamcinolone ointment (KENALOG) 0.1 % Apply 1 application topically daily as needed.      . vitamin B-12 (CYANOCOBALAMIN) 1000 MCG tablet Take 1,000 mcg by mouth daily.        Allergies  Allergen Reactions  . Penicillins Anxiety and Other (See Comments)    "made me feel like gravity had increased and it was pulling me down" (10/27/2012)    Past Medical History  Diagnosis Date  . Diverticulitis 02-2012    CT scan   . Fatty liver 02-2012    Mild- CT scan   . GERD  (gastroesophageal reflux disease)   . Coronary artery disease     a.  LHC 10/27/12 demonstrated pLAD 70%, AV CFX occluded, pOM 80%, dCFX collateralized the LAD and RCA, RCA without obstructive lesions, EF 50% with inferobasal HK;  b.  Echo 10/30/12: EF 55-60%, grade 1 diastolic dysfunction, mild LAE, normal LV wall motion;  c. s/p CABG 10/31/12 with Dr. Dorris Fetch:  L-LAD, left radial-OM. Marland Kitchen  . Childhood asthma   . Iron deficiency anemia 1980's  . Cluster headaches 1985    Mid 80's  . HLD (hyperlipidemia)   . Carotid stenosis     Pre-CABG Dopplers: RICA 40-59%    Past Surgical History  Procedure Date  . Cardiac catheterization 10/27/2012  . Radial keratotomy 1985    bilaterally  . Coronary artery bypass graft 10/31/2012    Procedure: CORONARY ARTERY BYPASS GRAFTING (CABG);  Surgeon: Loreli Slot, MD;  Location: [facility] OR;  Service: Open Heart Surgery;  Laterality: N/A;  . Radial artery harvest 10/31/2012    Procedure: RADIAL ARTERY HARVEST;  Surgeon: Loreli Slot, MD;  Location:  [facility] OR;  Service: Open Heart Surgery;  Laterality: Left;    Family History  Problem Relation Age of Onset  . Lung cancer Father   . Diabetes Mellitus II Paternal Grandmother     History   Social History  . Marital Status: Married    Spouse Name: N/A    Number of Children: N/A  . Years of Education: N/A   Occupational History  . Forensic psychologist of [employer]   Social History Main Topics  . Smoking status: Former Smoker -- 0.1 packs/day for 1.5 years    Types: Cigarettes  . Smokeless tobacco: Never Used     Comment: 10/27/2012 "stopped smoking ~ 2000"  . Alcohol Use: Yes     Comment: 10/27/2012 "maybe couple mixed drinks/month"  . Drug Use: Yes    Special: Marijuana, "Crack" cocaine     Comment: 10/27/2012 "last marijuana or crack ~ 2000"  . Sexually Active: Yes   Other Topics Concern  . Not on file   Social History Narrative  . No narrative on file     ROS: Please see the HPI.  All other systems reviewed and negative.  PHYSICAL EXAM:  BP 110/73  Pulse 82  Ht 6' (1.829 m)  Wt 273 lb (123.832 kg)  BMI 37.03 kg/m2  SpO2 97%  General: Well developed, well nourished, in no acute distress. Head:  Normocephalic and atraumatic.  Throat with minimal erythema.  No pustules or pus noted.   Neck: no JVD Lungs: Clear to auscultation and percussion. Heart: Normal S1 and S2.  No murmur, rubs or gallops.  Abdomen:  Normal bowel sounds; soft; non tender; no organomegaly Pulses: Pulses normal in all 4 extremities. Extremities: No clubbing or cyanosis. No edema. Neurologic: Alert and oriented x 3.  EKG:    ASSESSMENT AND PLAN:

## 2013-01-13 NOTE — Patient Instructions (Signed)
A chest x-ray takes a picture of the organs and structures inside the chest, including the heart, lungs, and blood vessels. This test can show several things, including, whether the heart is enlarges; whether fluid is building up in the lungs; and whether pacemaker / defibrillator leads are still in place. (This should be done in the basement at the [place] office across from [facility])  Your physician recommends that you have a FASTING LIPID and LIVER profile today  Your physician has recommended you make the following change in your medication: DECREASE Aspirin to 81mg  take one by mouth daily  Your physician recommends that you schedule a follow-up appointment in: 6 WEEKS with Dr Riley Kill

## 2013-01-13 NOTE — Assessment & Plan Note (Signed)
Recheck lipid and liver profile fasting.

## 2013-01-13 NOTE — Assessment & Plan Note (Signed)
No definite pus or pustules.  Lungs clear but will check CXR to make sure he has not developed a subtle pneumonia.

## 2013-01-16 ENCOUNTER — Other Ambulatory Visit: Payer: PRIVATE HEALTH INSURANCE

## 2013-01-16 ENCOUNTER — Telehealth: Payer: Self-pay | Admitting: Cardiology

## 2013-01-16 NOTE — Telephone Encounter (Signed)
**Note De-Identified Pearle Wandler Obfuscation** Pt given CXR results, he verbalized understanding.

## 2013-01-16 NOTE — Telephone Encounter (Signed)
New Problem:    Patient called in returning your call. Please call back. 

## 2013-03-02 ENCOUNTER — Ambulatory Visit: Payer: PRIVATE HEALTH INSURANCE | Admitting: Cardiology

## 2013-03-04 IMAGING — CR DG CHEST 2V
2 series · 2 of 2 positions shown · non-contrast
Comparison: On chest x-ray of 11/03/2012

CLINICAL DATA: CABG in [REDACTED], some chest pain, follow

CHEST - 2 VIEW

[w chest pa]
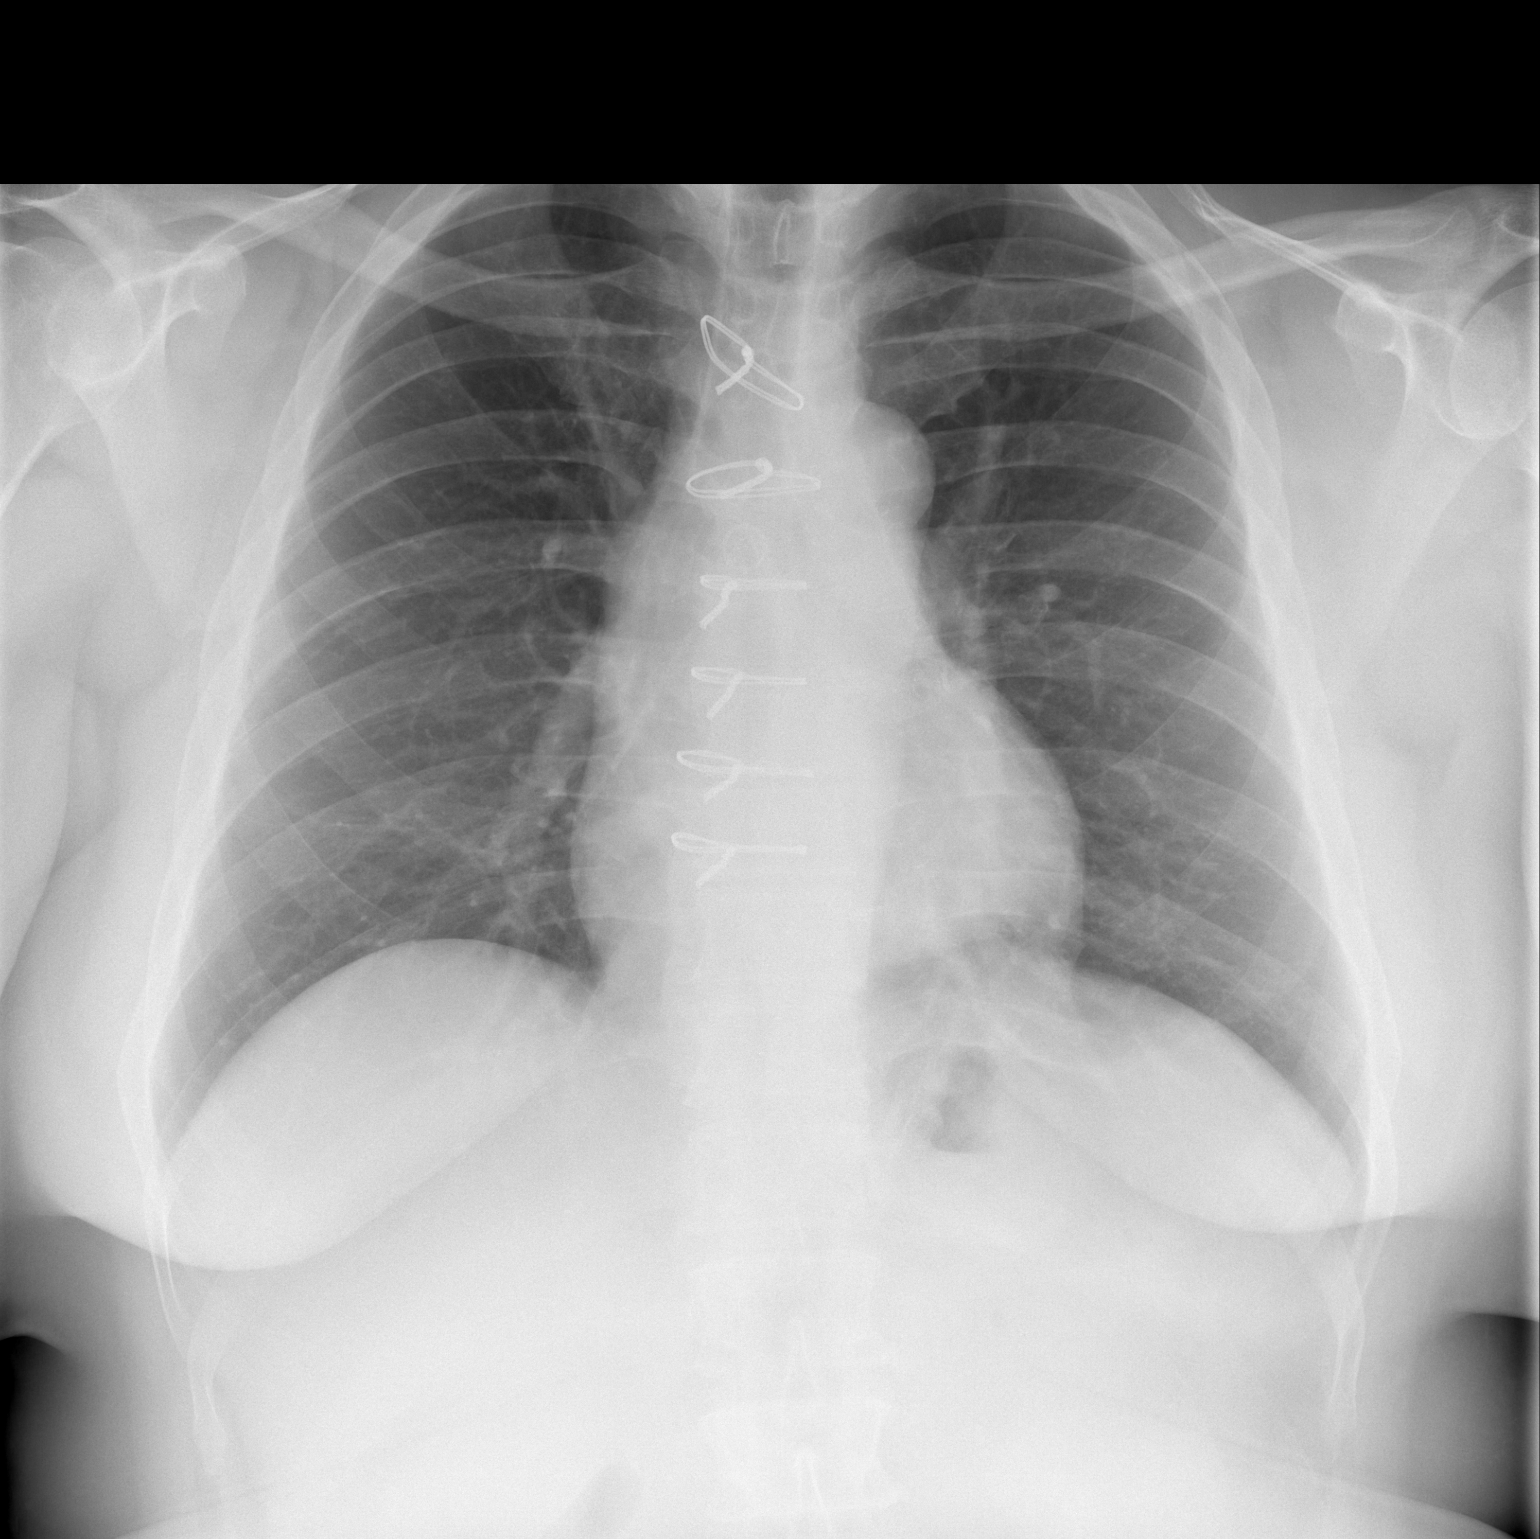

[w chest lat]
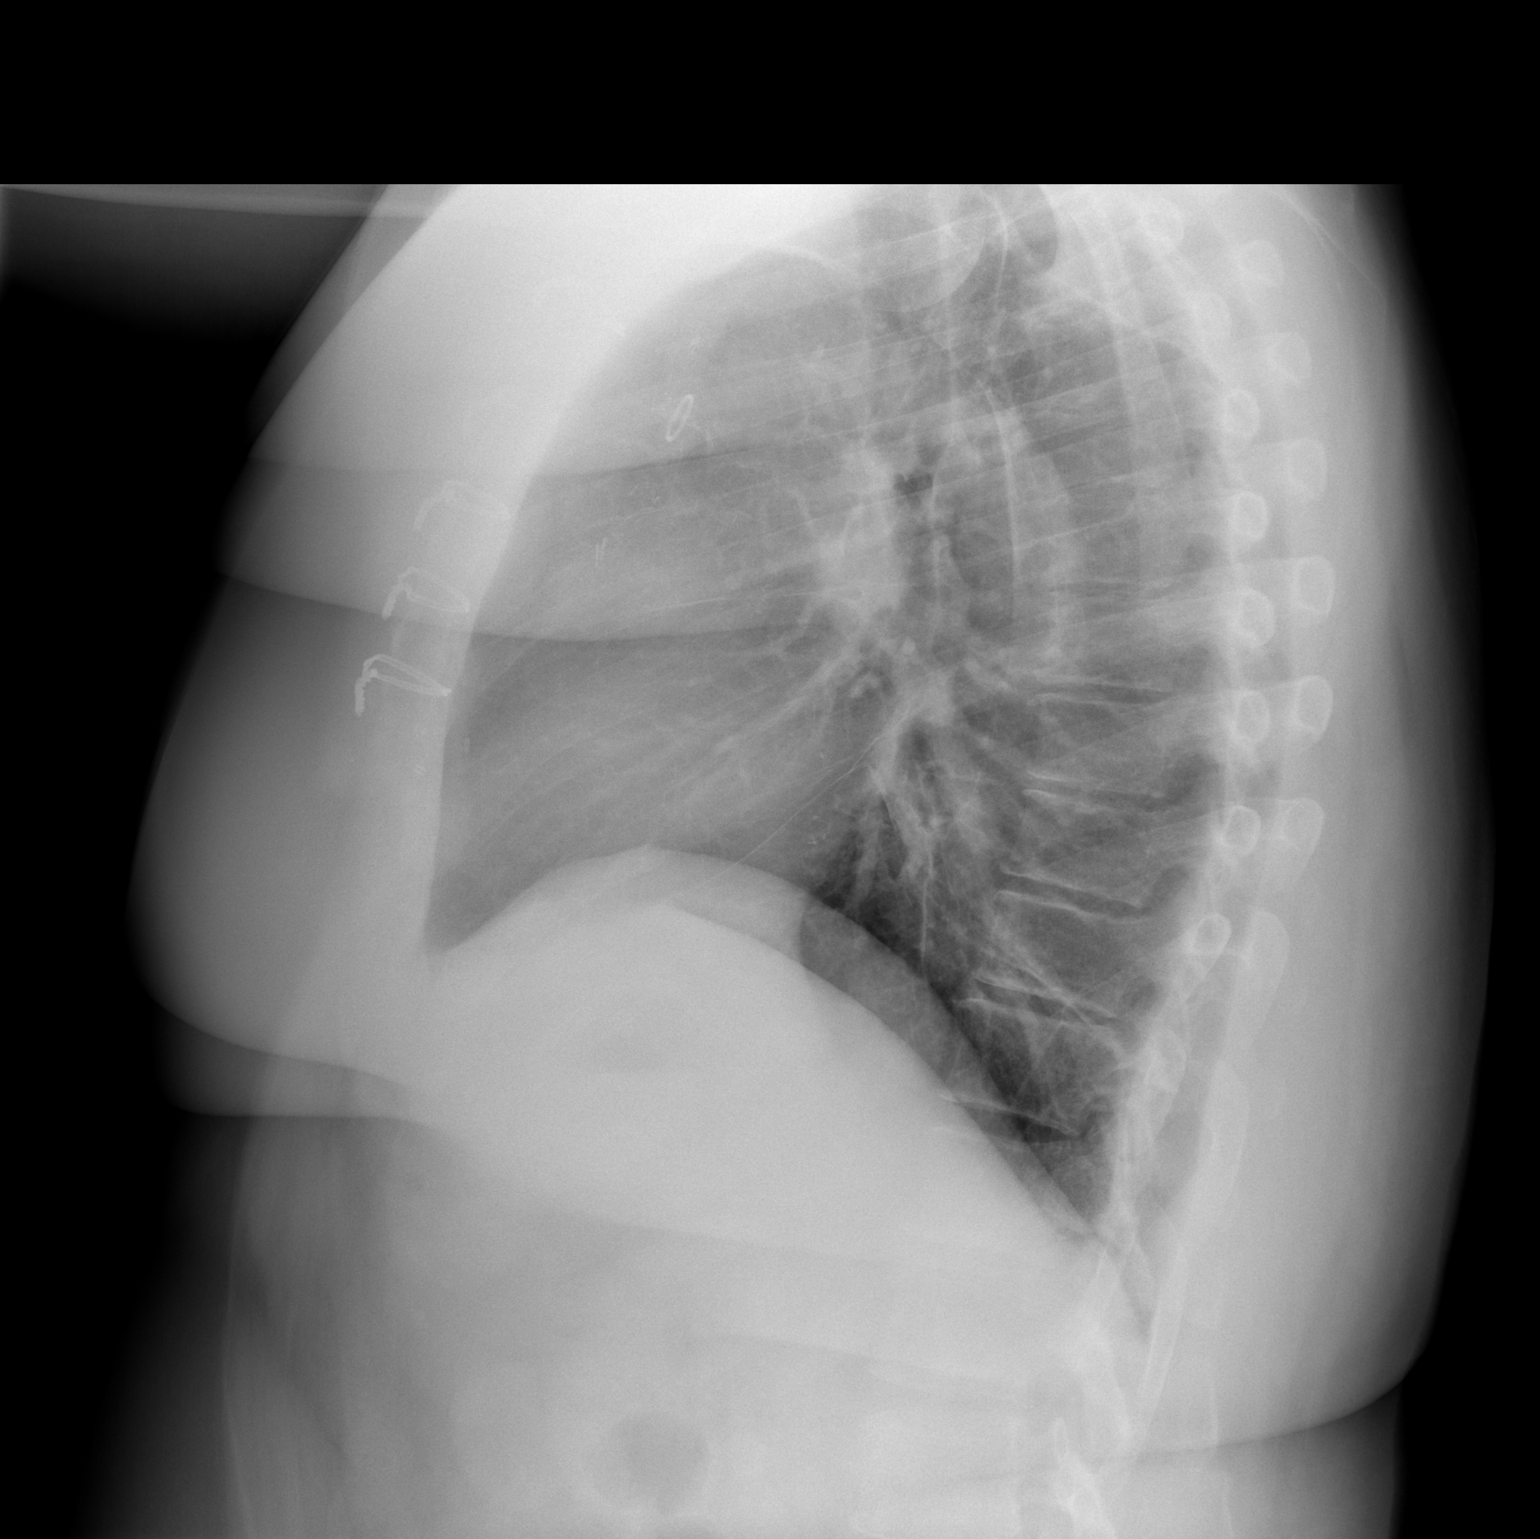

[2 of 2 positions shown; findings below may reference images not displayed]

FINDINGS: The pleural effusions have resolved, and the basilar
atelectasis has largely resolved.  The heart is mildly enlarged and
stable.  Median sternotomy sutures are noted.  No acute bony
abnormality is seen.
IMPRESSION: No active lung disease.  Resolution of pleural effusions and
basilar atelectasis.

## 2013-03-27 ENCOUNTER — Encounter: Payer: Self-pay | Admitting: Cardiology

## 2013-03-27 ENCOUNTER — Ambulatory Visit (INDEPENDENT_AMBULATORY_CARE_PROVIDER_SITE_OTHER): Payer: PRIVATE HEALTH INSURANCE | Admitting: Cardiology

## 2013-03-27 VITALS — BP 109/75 | HR 52 | Ht 72.0 in | Wt 274.0 lb

## 2013-03-27 DIAGNOSIS — R079 Chest pain, unspecified: Secondary | ICD-10-CM

## 2013-03-27 DIAGNOSIS — E785 Hyperlipidemia, unspecified: Secondary | ICD-10-CM

## 2013-03-27 DIAGNOSIS — I679 Cerebrovascular disease, unspecified: Secondary | ICD-10-CM | POA: Insufficient documentation

## 2013-03-27 DIAGNOSIS — I2510 Atherosclerotic heart disease of native coronary artery without angina pectoris: Secondary | ICD-10-CM

## 2013-03-27 MED ORDER — ATORVASTATIN CALCIUM 80 MG PO TABS: 80.0000 mg | ORAL_TABLET | Freq: Every day | ORAL | Status: DC

## 2013-03-27 NOTE — Progress Notes (Signed)
HPI: pleasant male for followup of coronary artery disease. PVC followed by Dr. Riley Kill. Patient is status post coronary artery bypassing graft in November of 2013. Patient had a LIMA to the LAD and a left radial to the OM. Preoperative echocardiogram in November of 2013 showed normal LV function, grade 1 diastolic dysfunction and mild left atrial enlargement. Carotid Dopplers in November of 2013 showed a 40-59% right stenosis and no left stenosis. Since he was last seen, the patient denies any dyspnea on exertion, orthopnea, PND, pedal edema, palpitations, syncope or chest pain.   Current Outpatient Prescriptions  Medication Sig Dispense Refill  . aspirin 81 MG EC tablet Take 1 tablet (81 mg total) by mouth daily.  1 tablet    . atorvastatin (LIPITOR) 40 MG tablet Take 1 tablet (40 mg total) by mouth daily.  90 tablet  3  . Cholecalciferol (VITAMIN D) 400 UNITS capsule 1 or 2 tabs po qd      . hydrOXYzine (ATARAX/VISTARIL) 10 MG tablet Take 10 mg by mouth as needed.       . metoprolol tartrate (LOPRESSOR) 25 MG tablet Take 1 tablet (25 mg total) by mouth 2 (two) times daily.  60 tablet  1  . topiramate (TOPAMAX) 25 MG capsule 2 tabs po qhs       No current facility-administered medications for this visit.     Past Medical History  Diagnosis Date  . Diverticulitis 02-2012    CT scan   . Fatty liver 02-2012    Mild- CT scan   . GERD (gastroesophageal reflux disease)   . Coronary artery disease     a.  LHC 10/27/12 demonstrated pLAD 70%, AV CFX occluded, pOM 80%, dCFX collateralized the LAD and RCA, RCA without obstructive lesions, EF 50% with inferobasal HK;  b.  Echo 10/30/12: EF 55-60%, grade 1 diastolic dysfunction, mild LAE, normal LV wall motion;  c. s/p CABG 10/31/12 with Dr. Dorris Fetch:  L-LAD, left radial-OM. Marland Kitchen  . Childhood asthma   . Iron deficiency anemia 1980's  . Cluster headaches 1985    Mid 80's  . HLD (hyperlipidemia)   . Carotid stenosis     Pre-CABG Dopplers: RICA  40-59%    Past Surgical History  Procedure Laterality Date  . Cardiac catheterization  10/27/2012  . Radial keratotomy  1985    bilaterally  . Coronary artery bypass graft  10/31/2012    Procedure: CORONARY ARTERY BYPASS GRAFTING (CABG);  Surgeon: Loreli Slot, MD;  Location: [facility] OR;  Service: Open Heart Surgery;  Laterality: N/A;  . Radial artery harvest  10/31/2012    Procedure: RADIAL ARTERY HARVEST;  Surgeon: Loreli Slot, MD;  Location: [facility] OR;  Service: Open Heart Surgery;  Laterality: Left;    History   Social History  . Marital Status: Married    Spouse Name: N/A    Number of Children: N/A  . Years of Education: N/A   Occupational History  . 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 Smoking status: Former Smoker -- 0.12 packs/day for 1.5 years    Types: Cigarettes  . Smokeless tobacco: Never Used     Comment: 10/27/2012 "stopped smoking ~ 2000"  . Alcohol Use: Yes     Comment: 10/27/2012 "maybe couple mixed drinks/month"  . Drug Use: Yes    Special: Marijuana, "Crack" cocaine     Comment: 10/27/2012 "last marijuana or crack ~ 2000"  . Sexually  Active: Yes   Other Topics Concern  . Not on file   Social History Narrative  . No narrative on file    ROS: no fevers or chills, productive cough, hemoptysis, dysphasia, odynophagia, melena, hematochezia, dysuria, hematuria, rash, seizure activity, orthopnea, PND, pedal edema, claudication. Remaining systems are negative.  Physical Exam: Well-developed well-nourished in no acute distress.  Skin is warm and dry.  HEENT is normal.  Neck is supple.  Chest is clear to auscultation with normal expansion.  Cardiovascular exam is regular rate and rhythm.  Abdominal exam nontender or distended. No masses palpated. Extremities show no edema. neuro grossly intact  ECG sinus rhythm at a rate of 52. First-degree AV block. No ST changes.

## 2013-03-27 NOTE — Assessment & Plan Note (Signed)
Change Lipitor to 80 mg daily. Lipids and liver in 6 weeks.

## 2013-03-27 NOTE — Patient Instructions (Addendum)
Your physician wants you to follow-up in: 6 MONTHS WITH DR Jens Som You will receive a reminder letter in the mail two months in advance. If you don't receive a letter, please call our office to schedule the follow-up appointment.   INCREASE LIPITOR TO 80 MG ONCE DAILY  Your physician recommends that you return for lab work in: 6 WEEKS = DO NOT EAT PRIOR TO LAB WORK  Your physician has requested that you have a carotid duplex. This test is an ultrasound of the carotid arteries in your neck. It looks at blood flow through these arteries that supply the brain with blood. Allow one hour for this exam. There are no restrictions or special instructions. DUE IN [place]

## 2013-03-27 NOTE — Assessment & Plan Note (Signed)
Continue aspirin and statin. Followup carotid Dopplers November 2014. 

## 2013-03-27 NOTE — Assessment & Plan Note (Signed)
Continue aspirin and statin. 

## 2013-04-17 ENCOUNTER — Other Ambulatory Visit: Payer: PRIVATE HEALTH INSURANCE

## 2013-10-26 ENCOUNTER — Other Ambulatory Visit: Payer: Self-pay

## 2014-05-07 ENCOUNTER — Encounter: Payer: Self-pay | Admitting: Cardiology

## 2014-05-07 NOTE — Telephone Encounter (Signed)
This encounter was created in error - please disregard.

## 2014-05-07 NOTE — Telephone Encounter (Signed)
New Message:  Pt is wanting labs drawn at his next appt w/ [facility]. No orders in epic.. But I'm uncertain if the [facility] lab uses Epic.

## 2014-05-16 ENCOUNTER — Telehealth: Payer: Self-pay | Admitting: Cardiology

## 2014-05-16 NOTE — Telephone Encounter (Signed)
Spoke with pt, for about two weeks now he has noticed increased fatigue and tightness in the chest. He has been working a lot and feels it maybe related, but he cont to feel tired after resting. His legs feel tired and he will occ have a tightness in his chest with exertion. He has had these issues before related to dehydration. He confirms he is drinking plenty of fluids. He was offered an appt Monday next week but he will be out of town. Follow up scheduled next available, the pt is to call if symptoms change or worsen.

## 2014-05-16 NOTE — Telephone Encounter (Signed)
New message          Pt is not feeling well, he has no energy and is requesting to see dr Jens Som this week. Please give pt a call.

## 2014-05-28 ENCOUNTER — Ambulatory Visit: Payer: PRIVATE HEALTH INSURANCE | Admitting: Physician Assistant

## 2014-06-07 ENCOUNTER — Ambulatory Visit (INDEPENDENT_AMBULATORY_CARE_PROVIDER_SITE_OTHER): Payer: PRIVATE HEALTH INSURANCE | Admitting: Physician Assistant

## 2014-06-07 ENCOUNTER — Encounter: Payer: Self-pay | Admitting: Physician Assistant

## 2014-06-07 VITALS — BP 123/69 | HR 67 | Ht 72.0 in | Wt 269.0 lb

## 2014-06-07 DIAGNOSIS — I2510 Atherosclerotic heart disease of native coronary artery without angina pectoris: Secondary | ICD-10-CM

## 2014-06-07 DIAGNOSIS — I6529 Occlusion and stenosis of unspecified carotid artery: Secondary | ICD-10-CM

## 2014-06-07 DIAGNOSIS — R079 Chest pain, unspecified: Secondary | ICD-10-CM

## 2014-06-07 DIAGNOSIS — R5383 Other fatigue: Secondary | ICD-10-CM

## 2014-06-07 DIAGNOSIS — R5381 Other malaise: Secondary | ICD-10-CM

## 2014-06-07 DIAGNOSIS — E785 Hyperlipidemia, unspecified: Secondary | ICD-10-CM

## 2014-06-07 MED ORDER — METOPROLOL SUCCINATE ER 25 MG PO TB24: 25.0000 mg | ORAL_TABLET | Freq: Every day | ORAL | Status: DC

## 2014-06-07 MED ORDER — ATORVASTATIN CALCIUM 80 MG PO TABS: 80.0000 mg | ORAL_TABLET | Freq: Every day | ORAL | Status: DC

## 2014-06-07 NOTE — Progress Notes (Signed)
Cardiology Office Note    Date:  06/07/2014   ID:  Bradley Watson, DOB 05/23/1961, MRN [id number]  PCP:  Pola CornSPRUILL,JEROME O, MD  Cardiologist:  Dr. Olga MillersBrian Crenshaw      History of Present Illness: Bradley Watson is a 53 y.o. male with a history of CAD, status post CABG 10/2012, carotid stenosis, HL.  Last seen by Dr. Jens Somrenshaw for/2014.  He presents today for the evaluation of fatigue and chest pain. This has been ongoing for last 2-3 months. Chest symptoms are somewhat atypical. He notices it more at home than at work. It is not clear that it is exertional. He denies any radiating symptoms. He denies significant dyspnea. He denies syncope, orthopnea, PND or edema.   Studies:  - LHC 10/27/12 demonstrated pLAD 70%, AV CFX occluded, pOM 80%, dCFX collateralized the LAD and RCA, RCA without obstructive lesions, EF 50% with inferobasal HK => CABG  - Echo 10/30/12: EF 55-60%, grade 1 diastolic dysfunction, mild LAE, normal LV wall motion  - Pre-CABG Dopplers: RICA 40-59%   Recent Labs: No results found for requested labs within last 365 days.  Wt Readings from Last 3 Encounters:  06/07/14 269 lb (122.018 kg)  03/27/13 274 lb (124.286 kg)  01/13/13 273 lb (123.832 kg)     Past Medical History  Diagnosis Date  . Diverticulitis 02-2012    CT scan   . Fatty liver 02-2012    Mild- CT scan   . GERD (gastroesophageal reflux disease)   . Coronary artery disease     a.  LHC 10/27/12 demonstrated pLAD 70%, AV CFX occluded, pOM 80%, dCFX collateralized the LAD and RCA, RCA without obstructive lesions, EF 50% with inferobasal HK;  b.  Echo 10/30/12: EF 55-60%, grade 1 diastolic dysfunction, mild LAE, normal LV wall motion;  c. s/p CABG 10/31/12 with Dr. Dorris FetchHendrickson:  L-LAD, left radial-OM. Marland Kitchen.  . Childhood asthma   . Iron deficiency anemia 1980's  . Cluster headaches 1985    Mid 80's  . HLD (hyperlipidemia)   . Carotid stenosis     Pre-CABG Dopplers: RICA 40-59%    Current Outpatient  Prescriptions  Medication Sig Dispense Refill  . aspirin 81 MG EC tablet Take 1 tablet (81 mg total) by mouth daily.  1 tablet    . hydrOXYzine (ATARAX/VISTARIL) 10 MG tablet Take 10 mg by mouth as needed.        No current facility-administered medications for this visit.    Allergies:   Penicillins   Social History:  The patient  reports that he has quit smoking. His smoking use included Cigarettes. He has a .18 pack-year smoking history. He has never used smokeless tobacco. He reports that he drinks alcohol. He reports that he uses illicit drugs (Marijuana and "Crack" cocaine).   Family History:  The patient's family history includes Diabetes Mellitus II in his paternal grandmother; Lung cancer in his father.   ROS:  Please see the history of present illness.      All other systems reviewed and negative.   PHYSICAL EXAM: VS:  BP 123/69  Pulse 67  Ht 6' (1.829 m)  Wt 269 lb (122.018 kg)  BMI 36.48 kg/m2 Well nourished, well developed, in no acute distress HEENT: normal Neck: no JVD Cardiac:  normal S1, S2; RRR; no murmur Lungs:  clear to auscultation bilaterally, no wheezing, rhonchi or rales Abd: soft, nontender, no hepatomegaly Ext: no edema Skin: warm and dry Neuro:  CNs 2-12 intact, no  focal abnormalities noted  EKG:  NSR, HR 67, normal axis, no acute changes      ASSESSMENT AND PLAN:  1. Chest pain, unspecified:  Etiology unclear. Symptoms are somewhat atypical. However, he did have fatigue and chest discomfort prior to his bypass. I will arrange an ETT-Myoview to assess for ischemia. 2. Fatigue:  He denies a significant history of snoring or witnessed apneic episodes. Arrange ETT-Myoview as noted above. I will also arrange basic metabolic panel, CBC and TSH. If the above workup is unrevealing, consider sleep study. 3. Coronary Artery Disease s/p CABG:  Arrange nuclear study as noted.  Continue aspirin. Resume beta blocker (Toprol-XL 25 mg daily). Resume statin (Lipitor  80 mg daily). 4. Hyperlipidemia:  Resume Lipitor 80 mg daily. Check lipids and LFTs in 6 weeks. 5. Carotid Stenosis - Plan: Carotid duplex  6. Disposition: Followup with Dr. Jens Somrenshaw as planned.   Signed, Brynda RimScott Weaver, PA-C, MHS 06/07/2014 4:12 PM    [facility] [address], [place], [place]  [address] Phone: [phone]; Fax: [phone]

## 2014-06-07 NOTE — Patient Instructions (Signed)
RE-START TOPROL XL 25 MG 1 TABLET DAILY RE-START LIPITOR 80 MG 1 TABLET DAILY  LAB WORK BMET, CBC W/DIFF, TSH TODAY  FASTING LIPID AND LIVER PANEL 07/17/14 WHEN YOU SEE DR. CRENSHAW AT OUR [place] [facility] ([facility]) YOU HAVE AN APPT WITH DR. CRENSHAW ON 07/17/14 AT 8:30  Your physician has requested that you have a carotid duplex. This test is an ultrasound of the carotid arteries in your neck. It looks at blood flow through these arteries that supply the brain with blood. Allow one hour for this exam. There are no restrictions or special instructions.  Your physician has requested that you have en exercise stress myoview. For further information please visit [email or web address]. Please follow instruction sheet, as given.

## 2014-06-08 ENCOUNTER — Telehealth: Payer: Self-pay | Admitting: *Deleted

## 2014-06-08 LAB — CBC WITH DIFFERENTIAL/PLATELET
BASOS PCT: 0.6 % (ref 0.0–3.0)
Basophils Absolute: 0 10*3/uL (ref 0.0–0.1)
EOS ABS: 0.1 10*3/uL (ref 0.0–0.7)
EOS PCT: 1.8 % (ref 0.0–5.0)
HEMATOCRIT: 41.5 % (ref 39.0–52.0)
Hemoglobin: 13.6 g/dL (ref 13.0–17.0)
LYMPHS ABS: 2.9 10*3/uL (ref 0.7–4.0)
Lymphocytes Relative: 49.5 % — ABNORMAL HIGH (ref 12.0–46.0)
MCHC: 32.8 g/dL (ref 30.0–36.0)
MCV: 88.3 fl (ref 78.0–100.0)
MONO ABS: 0.5 10*3/uL (ref 0.1–1.0)
Monocytes Relative: 8.9 % (ref 3.0–12.0)
NEUTROS ABS: 2.3 10*3/uL (ref 1.4–7.7)
NEUTROS PCT: 39.2 % — AB (ref 43.0–77.0)
Platelets: 284 10*3/uL (ref 150.0–400.0)
RBC: 4.7 Mil/uL (ref 4.22–5.81)
RDW: 16 % — ABNORMAL HIGH (ref 11.5–15.5)
WBC: 5.8 10*3/uL (ref 4.0–10.5)

## 2014-06-08 LAB — BASIC METABOLIC PANEL
BUN: 24 mg/dL — ABNORMAL HIGH (ref 6–23)
CHLORIDE: 103 meq/L (ref 96–112)
CO2: 27 meq/L (ref 19–32)
CREATININE: 1 mg/dL (ref 0.4–1.5)
Calcium: 9.2 mg/dL (ref 8.4–10.5)
GFR: 97.38 mL/min (ref >60.00)
Glucose, Bld: 91 mg/dL (ref 70–99)
POTASSIUM: 4.1 meq/L (ref 3.5–5.1)
Sodium: 139 mEq/L (ref 135–145)

## 2014-06-08 LAB — TSH: TSH: 1.24 u[IU]/mL (ref 0.35–4.50)

## 2014-06-08 NOTE — Telephone Encounter (Signed)
pt notified about lab results with verbal understanding  

## 2014-06-18 ENCOUNTER — Ambulatory Visit (HOSPITAL_COMMUNITY): Payer: PRIVATE HEALTH INSURANCE | Attending: Cardiovascular Disease | Admitting: Cardiology

## 2014-06-18 ENCOUNTER — Ambulatory Visit (HOSPITAL_COMMUNITY): Payer: PRIVATE HEALTH INSURANCE | Attending: Cardiovascular Disease | Admitting: Radiology

## 2014-06-18 VITALS — BP 112/90 | HR 48 | Ht 72.0 in | Wt 270.0 lb

## 2014-06-18 DIAGNOSIS — R5383 Other fatigue: Secondary | ICD-10-CM

## 2014-06-18 DIAGNOSIS — R079 Chest pain, unspecified: Secondary | ICD-10-CM | POA: Insufficient documentation

## 2014-06-18 DIAGNOSIS — I6529 Occlusion and stenosis of unspecified carotid artery: Secondary | ICD-10-CM

## 2014-06-18 DIAGNOSIS — I2510 Atherosclerotic heart disease of native coronary artery without angina pectoris: Secondary | ICD-10-CM | POA: Insufficient documentation

## 2014-06-18 DIAGNOSIS — E785 Hyperlipidemia, unspecified: Secondary | ICD-10-CM | POA: Insufficient documentation

## 2014-06-18 DIAGNOSIS — R5381 Other malaise: Secondary | ICD-10-CM | POA: Insufficient documentation

## 2014-06-18 MED ORDER — TECHNETIUM TC 99M SESTAMIBI GENERIC - CARDIOLITE
33.0000 | Freq: Once | INTRAVENOUS | Status: AC | PRN
Start: 2014-06-18 — End: 2014-06-18
  Administered 2014-06-18: 33 via INTRAVENOUS

## 2014-06-18 MED ORDER — TECHNETIUM TC 99M SESTAMIBI GENERIC - CARDIOLITE
11.0000 | Freq: Once | INTRAVENOUS | Status: AC | PRN
  Administered 2014-06-18: 11 via INTRAVENOUS

## 2014-06-18 NOTE — Progress Notes (Signed)
[facility] SITE 3 NUCLEAR MED [address] [place], [place] [address] [phone]    Cardiology Nuclear Med Study  Angelique HolmCharles E Bihl is a 53 y.o. male     MRN : [id number]     DOB: 12/18/1961  Procedure Date: 06/18/2014  Nuclear Med Background Indication for Stress Test:  Evaluation for Ischemia and Graft Patency History:  CAD, Cath, CABG 2013, Echo 2013 EF 55-60%, MPI 2004 EF 47% Cardiac Risk Factors: Carotid Disease, History of Smoking and Lipids  Symptoms:  Chest Pain (last date of chest discomfort was three weeks ago) and Fatigue   Nuclear Pre-Procedure Caffeine/Decaff Intake:  None NPO After: 7:00pm   Lungs:  clear O2 Sat: 99% on room air. IV 0.9% NS with Angio Cath:  22g  IV Site: R Hand  IV Started by:  Cathlyn Parsonsynthia Hasspacher, RN  Chest Size (in):  54 Cup Size: n/a  Height: 6' (1.829 m)  Weight:  270 lb (122.471 kg)  BMI:  Body mass index is 36.61 kg/(m^2). Tech Comments:  No Toprol x 36 hrs    Nuclear Med Study 1 or 2 day study: 1 day  Stress Test Type:  Stress  Reading MD: n/a  Order Authorizing Cacie Gaskins:  Ripley FraiseBrian Crenshaw,MD  Resting Radionuclide: Technetium 6126m Sestamibi  Resting Radionuclide Dose: 11.0 mCi   Stress Radionuclide:  Technetium 4126m Sestamibi  Stress Radionuclide Dose: 33.0 mCi           Stress Protocol Rest HR: 48 Stress HR: 150  Rest BP: 112/90 Stress BP: 117/75  Exercise Time (min): 7:15 METS: 8.3           Dose of Adenosine (mg):  n/a Dose of Lexiscan: n/a mg  Dose of Atropine (mg): n/a Dose of Dobutamine: n/a mcg/kg/min (at max HR)  Stress Test Technologist: Nelson ChimesSharon Brooks, BS-ES  Nuclear Technologist:  Domenic PoliteStephen Carbone, CNMT     Rest Procedure:  Myocardial perfusion imaging was performed at rest 45 minutes following the intravenous administration of Technetium 6726m Sestamibi. Rest ECG: NSR - Normal EKG  Stress Procedure:  The patient exercised on the treadmill utilizing the Bruce Protocol for 7:15 minutes. The patient stopped due  to fatigue and denied any chest pain.  Technetium 7326m Sestamibi was injected at peak exercise and myocardial perfusion imaging was performed after a brief delay. Stress ECG: Significant ST abnormalities consistent with ischemia.  QPS Raw Data Images:  Normal; no motion artifact; normal heart/lung ratio. Stress Images:  There is mild apical thinning with normal uptake in other regions. Rest Images:  There is mild apical thinning with normal uptake in other regions. Subtraction (SDS):  No evidence of ischemia. Transient Ischemic Dilatation (Normal <1.22):  0.96 Lung/Heart Ratio (Normal <0.45):  0.26  Quantitative Gated Spect Images QGS EDV:  129 ml QGS ESV:  62 ml  Impression Exercise Capacity:  Good exercise capacity. BP Response:  Normal blood pressure response. Clinical Symptoms:  No chest pain. ECG Impression:  Significant ST abnormalities consistent with ischemia. Comparison with Prior Nuclear Study: No significant change from previous study  Overall Impression:  Low risk stress nuclear study.  Frequent PVCs at peak exercise. EKG with stress shows downsloping ST depression in inferolateral leads. Perfusion imaging shows apical thinning but no reversible ischemia. LV function is normal.  LV Ejection Fraction: 52%.  LV Wall Motion:  NL LV Function; NL Wall Motion  Cassell Clementhomas Brackbill MD

## 2014-06-18 NOTE — Progress Notes (Signed)
Carotid duplex performed 

## 2014-06-19 ENCOUNTER — Telehealth: Payer: Self-pay | Admitting: *Deleted

## 2014-06-19 ENCOUNTER — Encounter: Payer: Self-pay | Admitting: Physician Assistant

## 2014-06-19 NOTE — Telephone Encounter (Signed)
pt's wife notified about myoview results with verbal understanding

## 2014-06-21 ENCOUNTER — Encounter: Payer: Self-pay | Admitting: Physician Assistant

## 2014-06-25 ENCOUNTER — Telehealth: Payer: Self-pay | Admitting: *Deleted

## 2014-06-25 ENCOUNTER — Encounter (HOSPITAL_COMMUNITY): Payer: PRIVATE HEALTH INSURANCE

## 2014-06-25 NOTE — Telephone Encounter (Signed)
pt notified about carotid results with verbal understanding, as well as repeat carotids in 2 yrs.

## 2014-07-17 ENCOUNTER — Ambulatory Visit (INDEPENDENT_AMBULATORY_CARE_PROVIDER_SITE_OTHER): Payer: PRIVATE HEALTH INSURANCE | Admitting: Cardiology

## 2014-07-17 ENCOUNTER — Encounter: Payer: PRIVATE HEALTH INSURANCE | Admitting: Cardiology

## 2014-07-17 ENCOUNTER — Encounter: Payer: Self-pay | Admitting: Cardiology

## 2014-07-17 VITALS — BP 120/70 | HR 60 | Ht 72.0 in | Wt 273.3 lb

## 2014-07-17 DIAGNOSIS — I2510 Atherosclerotic heart disease of native coronary artery without angina pectoris: Secondary | ICD-10-CM

## 2014-07-17 DIAGNOSIS — E785 Hyperlipidemia, unspecified: Secondary | ICD-10-CM

## 2014-07-17 DIAGNOSIS — I209 Angina pectoris, unspecified: Secondary | ICD-10-CM

## 2014-07-17 DIAGNOSIS — I25119 Atherosclerotic heart disease of native coronary artery with unspecified angina pectoris: Secondary | ICD-10-CM

## 2014-07-17 DIAGNOSIS — I679 Cerebrovascular disease, unspecified: Secondary | ICD-10-CM

## 2014-07-17 LAB — HEPATIC FUNCTION PANEL
ALT: 19 U/L (ref 0–53)
AST: 19 U/L (ref 0–37)
Albumin: 3.9 g/dL (ref 3.5–5.2)
Alkaline Phosphatase: 78 U/L (ref 39–117)
BILIRUBIN DIRECT: 0.1 mg/dL (ref 0.0–0.3)
BILIRUBIN INDIRECT: 0.2 mg/dL (ref 0.2–1.2)
BILIRUBIN TOTAL: 0.3 mg/dL (ref 0.2–1.2)
Total Protein: 6.8 g/dL (ref 6.0–8.3)

## 2014-07-17 LAB — LIPID PANEL
CHOL/HDL RATIO: 4 ratio
CHOLESTEROL: 200 mg/dL (ref 0–200)
HDL: 50 mg/dL (ref >39)
LDL CALC: 137 mg/dL — AB (ref 0–99)
Triglycerides: 66 mg/dL (ref <150)
VLDL: 13 mg/dL (ref 0–40)

## 2014-07-17 NOTE — Progress Notes (Signed)
HPI: FU CAD, status post CABG 10/2012, carotid stenosis, HL. Patient had LIMA to the LAD, left radial to the obtuse marginal. Echocardiogram November 2013 showed normal LV function, grade 1 diastolic dysfunction and mild left atrial enlargement. Seen recently for chest pain. Nuclear study in June of 2015 showed an ejection fraction of 52%. Apical thinning but no ischemia. Carotid Dopplers June 2015 showed 1-39% right stenosis. Since he was last seen, the patient denies any dyspnea on exertion, orthopnea, PND, pedal edema, palpitations, syncope or chest pain.     Current Outpatient Prescriptions  Medication Sig Dispense Refill  . aspirin 81 MG EC tablet Take 1 tablet (81 mg total) by mouth daily.  1 tablet    . atorvastatin (LIPITOR) 80 MG tablet Take 1 tablet (80 mg total) by mouth daily.  30 tablet  11  . hydrOXYzine (ATARAX/VISTARIL) 10 MG tablet Take 10 mg by mouth as needed.       . metoprolol succinate (TOPROL-XL) 25 MG 24 hr tablet Take 1 tablet (25 mg total) by mouth daily.  30 tablet  11   No current facility-administered medications for this visit.     Past Medical History  Diagnosis Date  . Diverticulitis 02-2012    CT scan   . Fatty liver 02-2012    Mild- CT scan   . GERD (gastroesophageal reflux disease)   . Coronary artery disease     a.  LHC 10/27/12 demonstrated pLAD 70%, AV CFX occluded, pOM 80%, dCFX collateralized the LAD and RCA, RCA without obstructive lesions, EF 50% with inferobasal HK;  b.  Echo 10/30/12: EF 55-60%, grade 1 diastolic dysfunction, mild LAE, normal LV wall motion;  c. s/p CABG 10/31/12 with Dr. Dorris FetchHendrickson:  L-LAD, left radial-OM;   b.  ETT-Nuc (05/2014):  + ECG changes, no ischemia; low risk  . Childhood asthma   . Iron deficiency anemia 1980's  . Cluster headaches 1985    Mid 80's  . HLD (hyperlipidemia)   . Carotid stenosis     Pre-CABG Dopplers: RICA 40-59%;  Carotid US (05/2014):  R 1-39%; normal LICA  . Hx of cardiovascular stress  test     ETT-Myovew (05/2014):  + ECG changes with down-sloping ST depression; images with apical thinning but no ischemia, EF 52%; Low Risk    Past Surgical History  Procedure Laterality Date  . Cardiac catheterization  10/27/2012  . Radial keratotomy  1985    bilaterally  . Coronary artery bypass graft  10/31/2012    Procedure: CORONARY ARTERY BYPASS GRAFTING (CABG);  Surgeon: Loreli SlotSteven C Hendrickson, MD;  [facility];  Service: Open Heart Surgery;  Laterality: N/A;  . Radial artery harvest  10/31/2012    Procedure: RADIAL ARTERY HARVEST;  Surgeon: Loreli SlotSteven C Hendrickson, MD;  [facility];  Service: Open Heart Surgery;  Laterality: Left;    History   Social History  . Marital Status: Married    Spouse Name: N/A    Number of Children: N/A  . Years of Education: N/A   Occupational History  . Forensic psychologistales consultant     [employer]   Social History Main Topics  . Smoking status: Former Smoker -- 0.12 packs/day for 1.5 years    Types: Cigarettes  . Smokeless tobacco: Never Used     Comment: 10/27/2012 "stopped smoking ~ 2000"  . Alcohol Use: Yes     Comment: 10/27/2012 "maybe couple mixed drinks/month"  . Drug Use: Yes  Special: Marijuana, "Crack" cocaine     Comment: 10/27/2012 "last marijuana or crack ~ 2000"  . Sexual Activity: Yes   Other Topics Concern  . Not on file   Social History Narrative  . No narrative on file    ROS: no fevers or chills, productive cough, hemoptysis, dysphasia, odynophagia, melena, hematochezia, dysuria, hematuria, rash, seizure activity, orthopnea, PND, pedal edema, claudication. Remaining systems are negative.  Physical Exam: Well-developed obese in no acute distress.  Skin is warm and dry.  HEENT is normal.  Neck is supple.  Chest is clear to auscultation with normal expansion.  Cardiovascular exam is regular rate and rhythm.  Abdominal exam nontender or distended. No masses palpated. Extremities show no edema. neuro  grossly intact

## 2014-07-17 NOTE — Assessment & Plan Note (Signed)
Continue statin. Check lipids and liver. 

## 2014-07-17 NOTE — Progress Notes (Signed)
HPI: FU CAD, status post CABG 10/2012, carotid stenosis, HL. Nuclear study June 2013 showed an ejection fraction of 52%. Apical thinning but no ischemia. Carotid Dopplers June 2015 showed 1-39% right and no stenosis on the left. Studies:  - LHC 10/27/12 demonstrated pLAD 70%, AV CFX occluded, pOM 80%, dCFX collateralized the LAD and RCA, RCA without obstructive lesions, EF 50% with inferobasal HK => CABG  - Echo 10/30/12: EF 55-60%, grade 1 diastolic dysfunction, mild LAE, normal LV wall motion    Current Outpatient Prescriptions  Medication Sig Dispense Refill  . aspirin 81 MG EC tablet Take 1 tablet (81 mg total) by mouth daily.  1 tablet    . atorvastatin (LIPITOR) 80 MG tablet Take 1 tablet (80 mg total) by mouth daily.  30 tablet  11  . hydrOXYzine (ATARAX/VISTARIL) 10 MG tablet Take 10 mg by mouth as needed.       . metoprolol succinate (TOPROL-XL) 25 MG 24 hr tablet Take 1 tablet (25 mg total) by mouth daily.  30 tablet  11   No current facility-administered medications for this visit.     Past Medical History  Diagnosis Date  . Diverticulitis 02-2012    CT scan   . Fatty liver 02-2012    Mild- CT scan   . GERD (gastroesophageal reflux disease)   . Coronary artery disease     a.  LHC 10/27/12 demonstrated pLAD 70%, AV CFX occluded, pOM 80%, dCFX collateralized the LAD and RCA, RCA without obstructive lesions, EF 50% with inferobasal HK;  b.  Echo 10/30/12: EF 55-60%, grade 1 diastolic dysfunction, mild LAE, normal LV wall motion;  c. s/p CABG 10/31/12 with Dr. Dorris Fetch:  L-LAD, left radial-OM;   b.  ETT-Nuc (05/2014):  + ECG changes, no ischemia; low risk  . Childhood asthma   . Iron deficiency anemia 1980's  . Cluster headaches 1985    Mid 80's  . HLD (hyperlipidemia)   . Carotid stenosis     Pre-CABG Dopplers: RICA 40-59%;  Carotid US (05/2014):  R 1-39%; normal LICA  . Hx of cardiovascular stress test     ETT-Myovew (05/2014):  + ECG changes with down-sloping ST  depression; images with apical thinning but no ischemia, EF 52%; Low Risk    Past Surgical History  Procedure Laterality Date  . Cardiac catheterization  10/27/2012  . Radial keratotomy  1985    bilaterally  . Coronary artery bypass graft  10/31/2012    Procedure: CORONARY ARTERY BYPASS GRAFTING (CABG);  Surgeon: Loreli Slot, MD;  Location: [facility];  Service: Open Heart Surgery;  Laterality: N/A;  . Radial artery harvest  10/31/2012    Procedure: RADIAL ARTERY HARVEST;  Surgeon: Loreli Slot, MD;  Location: [facility] OR;  Service: Open Heart Surgery;  Laterality: Left;    History   Social History  . Marital Status: Married    Spouse Name: N/A    Number of Children: N/A  . Years of Education: N/A   Occupational History  . Forensic psychologist of [employer]   Social History Main Topics  . Smoking status: Former Smoker -- 0.12 packs/day for 1.5 years    Types: Cigarettes  . Smokeless tobacco: Never Used     Comment: 10/27/2012 "stopped smoking ~ 2000"  . Alcohol Use: Yes     Comment: 10/27/2012 "maybe couple mixed drinks/month"  . Drug Use: Yes    Special: Marijuana, "Crack" cocaine  Comment: 10/27/2012 "last marijuana or crack ~ 2000"  . Sexual Activity: Yes   Other Topics Concern  . Not on file   Social History Narrative  . No narrative on file    ROS: no fevers or chills, productive cough, hemoptysis, dysphasia, odynophagia, melena, hematochezia, dysuria, hematuria, rash, seizure activity, orthopnea, PND, pedal edema, claudication. Remaining systems are negative.  Physical Exam: Well-developed well-nourished in no acute distress.  Skin is warm and dry.  HEENT is normal.  Neck is supple.  Chest is clear to auscultation with normal expansion.  Cardiovascular exam is regular rate and rhythm.  Abdominal exam nontender or distended. No masses palpated. Extremities show no edema. neuro grossly intact  ECG     This encounter was created in  error - please disregard.

## 2014-07-17 NOTE — Assessment & Plan Note (Signed)
Continue aspirin and statin. Nuclear study low risk.

## 2014-07-17 NOTE — Assessment & Plan Note (Signed)
Continue aspirin and statin. Will schedule followup carotid Dopplers June 2017.

## 2014-07-17 NOTE — Patient Instructions (Signed)
Your physician wants you to follow-up in: ONE YEAR WITH DR CRENSHAW You will receive a reminder letter in the mail two months in advance. If you don't receive a letter, please call our office to schedule the follow-up appointment.   Your physician recommends that you HAVE LAB WORK TODAY 

## 2014-08-23 ENCOUNTER — Encounter (HOSPITAL_BASED_OUTPATIENT_CLINIC_OR_DEPARTMENT_OTHER): Payer: Self-pay | Admitting: Emergency Medicine

## 2014-08-23 ENCOUNTER — Emergency Department (HOSPITAL_BASED_OUTPATIENT_CLINIC_OR_DEPARTMENT_OTHER)
Admission: EM | Admit: 2014-08-23 | Discharge: 2014-08-23 | Disposition: A | Discharge status: Home / Self Care | Payer: PRIVATE HEALTH INSURANCE | Attending: Emergency Medicine | Admitting: Emergency Medicine

## 2014-08-23 ENCOUNTER — Emergency Department (HOSPITAL_BASED_OUTPATIENT_CLINIC_OR_DEPARTMENT_OTHER): Payer: PRIVATE HEALTH INSURANCE

## 2014-08-23 DIAGNOSIS — Z951 Presence of aortocoronary bypass graft: Secondary | ICD-10-CM | POA: Diagnosis not present

## 2014-08-23 DIAGNOSIS — Z8719 Personal history of other diseases of the digestive system: Secondary | ICD-10-CM | POA: Insufficient documentation

## 2014-08-23 DIAGNOSIS — R5383 Other fatigue: Principal | ICD-10-CM

## 2014-08-23 DIAGNOSIS — Z87891 Personal history of nicotine dependence: Secondary | ICD-10-CM | POA: Insufficient documentation

## 2014-08-23 DIAGNOSIS — R110 Nausea: Secondary | ICD-10-CM | POA: Insufficient documentation

## 2014-08-23 DIAGNOSIS — Z862 Personal history of diseases of the blood and blood-forming organs and certain disorders involving the immune mechanism: Secondary | ICD-10-CM | POA: Diagnosis not present

## 2014-08-23 DIAGNOSIS — E785 Hyperlipidemia, unspecified: Secondary | ICD-10-CM | POA: Insufficient documentation

## 2014-08-23 DIAGNOSIS — I2510 Atherosclerotic heart disease of native coronary artery without angina pectoris: Secondary | ICD-10-CM | POA: Diagnosis not present

## 2014-08-23 DIAGNOSIS — R5381 Other malaise: Secondary | ICD-10-CM | POA: Insufficient documentation

## 2014-08-23 DIAGNOSIS — J45909 Unspecified asthma, uncomplicated: Secondary | ICD-10-CM | POA: Insufficient documentation

## 2014-08-23 DIAGNOSIS — Z79899 Other long term (current) drug therapy: Secondary | ICD-10-CM | POA: Insufficient documentation

## 2014-08-23 DIAGNOSIS — Z880 Allergy status to penicillin: Secondary | ICD-10-CM | POA: Diagnosis not present

## 2014-08-23 DIAGNOSIS — Z8669 Personal history of other diseases of the nervous system and sense organs: Secondary | ICD-10-CM | POA: Insufficient documentation

## 2014-08-23 DIAGNOSIS — Z7982 Long term (current) use of aspirin: Secondary | ICD-10-CM | POA: Insufficient documentation

## 2014-08-23 DIAGNOSIS — R531 Weakness: Secondary | ICD-10-CM

## 2014-08-23 DIAGNOSIS — IMO0001 Reserved for inherently not codable concepts without codable children: Secondary | ICD-10-CM | POA: Insufficient documentation

## 2014-08-23 LAB — URINALYSIS, ROUTINE W REFLEX MICROSCOPIC
Bilirubin Urine: NEGATIVE
GLUCOSE, UA: NEGATIVE mg/dL
HGB URINE DIPSTICK: NEGATIVE
Ketones, ur: NEGATIVE mg/dL
LEUKOCYTES UA: NEGATIVE
Nitrite: NEGATIVE
PROTEIN: NEGATIVE mg/dL
Specific Gravity, Urine: 1.026 (ref 1.005–1.030)
Urobilinogen, UA: 0.2 mg/dL (ref 0.0–1.0)
pH: 5 (ref 5.0–8.0)

## 2014-08-23 LAB — COMPREHENSIVE METABOLIC PANEL
ALBUMIN: 3.9 g/dL (ref 3.5–5.2)
ALT: 14 U/L (ref 0–53)
AST: 21 U/L (ref 0–37)
Alkaline Phosphatase: 97 U/L (ref 39–117)
Anion gap: 14 (ref 5–15)
BUN: 22 mg/dL (ref 6–23)
CALCIUM: 9.8 mg/dL (ref 8.4–10.5)
CHLORIDE: 97 meq/L (ref 96–112)
CO2: 25 meq/L (ref 19–32)
CREATININE: 1 mg/dL (ref 0.50–1.35)
GFR calc Af Amer: >90 mL/min (ref >90)
GFR, EST NON AFRICAN AMERICAN: 83 mL/min — AB (ref >90)
Glucose, Bld: 119 mg/dL — ABNORMAL HIGH (ref 70–99)
Potassium: 4.4 mEq/L (ref 3.7–5.3)
SODIUM: 136 meq/L — AB (ref 137–147)
Total Bilirubin: 0.3 mg/dL (ref 0.3–1.2)
Total Protein: 8.1 g/dL (ref 6.0–8.3)

## 2014-08-23 LAB — CBC WITH DIFFERENTIAL/PLATELET
BASOS ABS: 0 10*3/uL (ref 0.0–0.1)
BASOS PCT: 0 % (ref 0–1)
Eosinophils Absolute: 0.1 10*3/uL (ref 0.0–0.7)
Eosinophils Relative: 2 % (ref 0–5)
HCT: 41.4 % (ref 39.0–52.0)
Hemoglobin: 14.2 g/dL (ref 13.0–17.0)
Lymphocytes Relative: 41 % (ref 12–46)
Lymphs Abs: 2.3 10*3/uL (ref 0.7–4.0)
MCH: 29.2 pg (ref 26.0–34.0)
MCHC: 34.3 g/dL (ref 30.0–36.0)
MCV: 85 fL (ref 78.0–100.0)
Monocytes Absolute: 0.8 10*3/uL (ref 0.1–1.0)
Monocytes Relative: 14 % — ABNORMAL HIGH (ref 3–12)
NEUTROS ABS: 2.3 10*3/uL (ref 1.7–7.7)
Neutrophils Relative %: 43 % (ref 43–77)
Platelets: 292 10*3/uL (ref 150–400)
RBC: 4.87 MIL/uL (ref 4.22–5.81)
RDW: 14.8 % (ref 11.5–15.5)
WBC: 5.5 10*3/uL (ref 4.0–10.5)

## 2014-08-23 LAB — TROPONIN I

## 2014-08-23 LAB — CK: Total CK: 438 U/L — ABNORMAL HIGH (ref 7–232)

## 2014-08-23 NOTE — ED Notes (Addendum)
Pt c/o "incredibly tired" x 6 months-worse x 2 days-pain to legs and SOB with exertion-pt with steady gait into triage-NAD

## 2014-08-23 NOTE — ED Provider Notes (Signed)
CSN: [id number]     Arrival date & time 08/23/14  1200 History   First MD Initiated Contact with Patient 08/23/14 1221     Chief Complaint  Patient presents with  . Fatigue     (Consider location/radiation/quality/duration/timing/severity/associated sxs/prior Treatment) Patient is a 53 y.o. male presenting with weakness. The history is provided by the patient. No language interpreter was used.  Weakness This is a new problem. The current episode started more than 1 month ago. The problem occurs constantly. The problem has been rapidly worsening. Associated symptoms include fatigue, myalgias, nausea and weakness. Pertinent negatives include no abdominal pain, fever or joint swelling. Nothing aggravates the symptoms. The treatment provided no relief.    Past Medical History  Diagnosis Date  . Diverticulitis 02-2012    CT scan   . Fatty liver 02-2012    Mild- CT scan   . GERD (gastroesophageal reflux disease)   . Coronary artery disease     a.  LHC 10/27/12 demonstrated pLAD 70%, AV CFX occluded, pOM 80%, dCFX collateralized the LAD and RCA, RCA without obstructive lesions, EF 50% with inferobasal HK;  b.  Echo 10/30/12: EF 55-60%, grade 1 diastolic dysfunction, mild LAE, normal LV wall motion;  c. s/p CABG 10/31/12 with Dr. Dorris Fetch:  L-LAD, left radial-OM;   b.  ETT-Nuc (05/2014):  + ECG changes, no ischemia; low risk  . Childhood asthma   . Iron deficiency anemia 1980's  . Cluster headaches 1985    Mid 80's  . HLD (hyperlipidemia)   . Carotid stenosis     Pre-CABG Dopplers: RICA 40-59%;  Carotid US (05/2014):  R 1-39%; normal LICA  . Hx of cardiovascular stress test     ETT-Myovew (05/2014):  + ECG changes with down-sloping ST depression; images with apical thinning but no ischemia, EF 52%; Low Risk   Past Surgical History  Procedure Laterality Date  . Cardiac catheterization  10/27/2012  . Radial keratotomy  1985    bilaterally  . Coronary artery bypass graft  10/31/2012     Procedure: CORONARY ARTERY BYPASS GRAFTING (CABG);  Surgeon: Loreli Slot, MD;  Location: [facility] OR;  Service: Open Heart Surgery;  Laterality: N/A;  . Radial artery harvest  10/31/2012    Procedure: RADIAL ARTERY HARVEST;  Surgeon: Loreli Slot, MD;  Location: [facility] OR;  Service: Open Heart Surgery;  Laterality: Left;   Family History  Problem Relation Age of Onset  . Lung cancer Father   . Diabetes Mellitus II Paternal Grandmother    History  Substance Use Topics  . Smoking status: Former Smoker -- 0.12 packs/day for 1.5 years  . Smokeless tobacco: Never Used  . Alcohol Use: Yes    Review of Systems  Constitutional: Positive for fatigue. Negative for fever.  Gastrointestinal: Positive for nausea. Negative for abdominal pain.  Musculoskeletal: Positive for myalgias. Negative for joint swelling.  Skin: Negative for wound.  Neurological: Positive for weakness.  All other systems reviewed and are negative.     Allergies  Penicillins  Home Medications   Prior to Admission medications   Medication Sig Start Date End Date Taking? Authorizing Provider  aspirin 81 MG EC tablet Take 1 tablet (81 mg total) by mouth daily. 01/13/13   Herby Abraham, MD  atorvastatin (LIPITOR) 80 MG tablet Take 1 tablet (80 mg total) by mouth daily. 06/07/14   Beatrice Lecher, PA-C  hydrOXYzine (ATARAX/VISTARIL) 10 MG tablet Take 10 mg by mouth as needed.  Historical Provider, MD  metoprolol succinate (TOPROL-XL) 25 MG 24 hr tablet Take 1 tablet (25 mg total) by mouth daily. 06/07/14   Beatrice Lecher, PA-C   BP 132/89  Pulse 75  Temp(Src) 97.9 F (36.6 C) (Oral)  Resp 20  Ht 6' (1.829 m)  Wt 270 lb (122.471 kg)  BMI 36.61 kg/m2  SpO2 98% Physical Exam  Constitutional: He appears well-developed.  HENT:  Head: Normocephalic and atraumatic.  Eyes: Pupils are equal, round, and reactive to light.  Neck: Normal range of motion. Neck supple.  Cardiovascular: Normal rate.    Pulmonary/Chest: Effort normal.  Abdominal: Soft.  Musculoskeletal: Normal range of motion.  Neurological: He is alert.  Skin: Skin is warm.  Psychiatric: He has a normal mood and affect.    ED Course  Procedures (including critical care time) Labs Review Labs Reviewed  CBC WITH DIFFERENTIAL  COMPREHENSIVE METABOLIC PANEL  URINALYSIS, ROUTINE W REFLEX MICROSCOPIC  CK    Imaging Review No results found.   EKG Interpretation   Date/Time:  Thursday August 23 2014 12:49:30 EDT Ventricular Rate:  63 PR Interval:  184 QRS Duration: 110 QT Interval:  416 QTC Calculation: 425 R Axis:   43 Text Interpretation:  Normal sinus rhythm Normal ECG No significant change  since last tracing Confirmed by HARRISON  MD, FORREST ([id number]) on 08/23/2014  1:13:54 PM     Results for orders placed during the hospital encounter of 08/23/14  CBC WITH DIFFERENTIAL      Result Value Ref Range   WBC 5.5  4.0 - 10.5 K/uL   RBC 4.87  4.22 - 5.81 MIL/uL   Hemoglobin 14.2  13.0 - 17.0 g/dL   HCT 16.1  09.6 - 04.5 %   MCV 85.0  78.0 - 100.0 fL   MCH 29.2  26.0 - 34.0 pg   MCHC 34.3  30.0 - 36.0 g/dL   RDW 40.9  81.1 - 91.4 %   Platelets 292  150 - 400 K/uL   Neutrophils Relative % 43  43 - 77 %   Neutro Abs 2.3  1.7 - 7.7 K/uL   Lymphocytes Relative 41  12 - 46 %   Lymphs Abs 2.3  0.7 - 4.0 K/uL   Monocytes Relative 14 (*) 3 - 12 %   Monocytes Absolute 0.8  0.1 - 1.0 K/uL   Eosinophils Relative 2  0 - 5 %   Eosinophils Absolute 0.1  0.0 - 0.7 K/uL   Basophils Relative 0  0 - 1 %   Basophils Absolute 0.0  0.0 - 0.1 K/uL  COMPREHENSIVE METABOLIC PANEL      Result Value Ref Range   Sodium 136 (*) 137 - 147 mEq/L   Potassium 4.4  3.7 - 5.3 mEq/L   Chloride 97  96 - 112 mEq/L   CO2 25  19 - 32 mEq/L   Glucose, Bld 119 (*) 70 - 99 mg/dL   BUN 22  6 - 23 mg/dL   Creatinine, Ser 7.82  0.50 - 1.35 mg/dL   Calcium 9.8  8.4 - 95.6 mg/dL   Total Protein 8.1  6.0 - 8.3 g/dL   Albumin 3.9  3.5 -  5.2 g/dL   AST 21  0 - 37 U/L   ALT 14  0 - 53 U/L   Alkaline Phosphatase 97  39 - 117 U/L   Total Bilirubin 0.3  0.3 - 1.2 mg/dL   GFR calc non Af Amer 83 (*) >90  mL/min   GFR calc Af Amer >90  >90 mL/min   Anion gap 14  5 - 15  URINALYSIS, ROUTINE W REFLEX MICROSCOPIC      Result Value Ref Range   Color, Urine YELLOW  YELLOW   APPearance CLEAR  CLEAR   Specific Gravity, Urine 1.026  1.005 - 1.030   pH 5.0  5.0 - 8.0   Glucose, UA NEGATIVE  NEGATIVE mg/dL   Hgb urine dipstick NEGATIVE  NEGATIVE   Bilirubin Urine NEGATIVE  NEGATIVE   Ketones, ur NEGATIVE  NEGATIVE mg/dL   Protein, ur NEGATIVE  NEGATIVE mg/dL   Urobilinogen, UA 0.2  0.0 - 1.0 mg/dL   Nitrite NEGATIVE  NEGATIVE   Leukocytes, UA NEGATIVE  NEGATIVE  CK      Result Value Ref Range   Total CK 438 (*) 7 - 232 U/L  TROPONIN I      Result Value Ref Range   Troponin I <0.30  <0.30 ng/mL   Dg Chest 2 View  08/23/2014   CLINICAL DATA:  Fatigue  EXAM: CHEST  2 VIEW  COMPARISON:  01/13/2013  FINDINGS: Postop CABG. Heart size and vascularity are normal. Lungs are clear. Negative for infiltrate or mass.  IMPRESSION: No active cardiopulmonary disease.   Electronically Signed   By: Marlan Palau M.D.   On: 08/23/2014 13:13    MDM  No acute cause found for pt's wekness.   I advised pt to schedule to see Dr. Tawnya Crook.   He may need stress test,   Pt understands plan   Final diagnoses:  Weakness       Elson Areas, PA-C 08/23/14 1439

## 2014-08-23 NOTE — Discharge Instructions (Signed)

## 2014-08-24 NOTE — ED Provider Notes (Signed)
Medical screening examination/treatment/procedure(s) were performed by non-physician practitioner and as supervising physician I was immediately available for consultation/collaboration.   EKG Interpretation   Date/Time:  Thursday August 23 2014 12:49:30 EDT Ventricular Rate:  63 PR Interval:  184 QRS Duration: 110 QT Interval:  416 QTC Calculation: 425 R Axis:   43 Text Interpretation:  Normal sinus rhythm Normal ECG No significant change  since last tracing Confirmed by Maanya Hippert  MD, Ailis Rigaud ([id number]) on 08/23/2014  1:13:54 PM        Purvis Sheffield, MD 08/24/14 1116

## 2014-09-03 ENCOUNTER — Telehealth: Payer: Self-pay | Admitting: Cardiology

## 2014-09-03 NOTE — Telephone Encounter (Signed)
Spoke with pt, i had been trying to get in touch with him about his cholesterol results from July. He confirms he is taking lipitor 80 mg daily

## 2014-09-03 NOTE — Telephone Encounter (Signed)
Pt is calling you back from a few weeks ago,concerning his test results.

## 2014-09-06 ENCOUNTER — Telehealth: Payer: Self-pay | Admitting: Cardiology

## 2014-09-06 NOTE — Telephone Encounter (Signed)
Increased risk of cardiac events with testoterone, avoid if possible Olga Millers

## 2014-09-06 NOTE — Telephone Encounter (Signed)
New message      Pt saw PCP.  He diagnosised pt with low testosterone.  Talk to the nurse/doctor regarding medication for this problem.  Will it effect his heart?

## 2014-09-06 NOTE — Telephone Encounter (Signed)
Dr. Shana Chute did labs and found his testosterone level to be 152.  Told patient he would research meds that he can safely use from a cardiac standpoint.  Told patient to ask Dr. Jens Som if it would be safe to use Androgel.  Message sent to Dr. Jens Som for review and advise.

## 2014-09-07 NOTE — Telephone Encounter (Signed)
Left message for pt to call.

## 2014-09-07 NOTE — Telephone Encounter (Signed)
Spoke with pt, Aware of dr Ludwig Clarks recommendations.  Referred pt back to his PCP for possible referral to endocrinologist..

## 2014-10-05 ENCOUNTER — Other Ambulatory Visit: Payer: Self-pay

## 2014-10-08 ENCOUNTER — Telehealth: Payer: Self-pay | Admitting: Cardiology

## 2014-10-08 NOTE — Telephone Encounter (Signed)
Left message for pt to call.

## 2014-10-08 NOTE — Telephone Encounter (Signed)
New message    Patient calling C/O metoprolol  25 mg is causing him to be  totally exhausted.  Pain / ache behind knee cap.

## 2014-10-16 NOTE — Telephone Encounter (Signed)
Ok to stay off metoprolol Olga MillersBrian Layla Kesling

## 2014-10-16 NOTE — Telephone Encounter (Signed)
Spoke with pt, he has not taken the metoprolol in one week. He was taking it at bedtime. He has no way of monitoring his bp. Will forward for dr Jens Somcrenshaw review

## 2014-10-16 NOTE — Telephone Encounter (Signed)
Spoke with pt, Aware of dr crenshaw's recommendations.  °

## 2014-11-29 ENCOUNTER — Encounter (HOSPITAL_COMMUNITY): Payer: Self-pay | Admitting: Cardiology

## 2016-04-06 ENCOUNTER — Telehealth: Payer: Self-pay | Admitting: Cardiology

## 2016-04-06 MED ORDER — ATORVASTATIN CALCIUM 80 MG PO TABS: 80.0000 mg | ORAL_TABLET | Freq: Every day | ORAL | Status: DC

## 2016-04-06 NOTE — Telephone Encounter (Signed)
Agree with primary care eval. Bradley MillersBrian Watson

## 2016-04-06 NOTE — Telephone Encounter (Signed)
Attempted to reach patient, no answer when dialed. 

## 2016-04-06 NOTE — Telephone Encounter (Signed)
Pt is returning your call

## 2016-04-06 NOTE — Telephone Encounter (Signed)
New message  Pt called states that within the last couple of days he has had hot flashes as if he was burning up. Drank Cold water and etc. Heart Palpitations. Pt request a call back to discuss these symptoms. Request a call back to determine if an OV is needed.

## 2016-04-06 NOTE — Telephone Encounter (Signed)
Returned call - pt not currently on anything daily, except for lipitor - this was refilled & he acknowledged.  advised to follow up at appt for medication recommendations.

## 2016-04-06 NOTE — Telephone Encounter (Signed)
Pt c/o viral illness w/ symptoms of nasal stuffiness/congestion, chills, fever, low energy. Notes persistent for 3-4 days. Chest pain w cough, nasal discharge. Overdue for office visit - wanted to know if advised to be seen due to c/o palpitations alongside his symptoms.  He is taking OTC meds including Mucinex DM. Advised to avoid Mucinex due to decongestant's effect on HR/BP, & to seek primary care or urgent care visit to evaluate symptoms.  Pt overdue for return visit. Scheduled for return visit w/ Dr. Jens Somrenshaw on 5/25.  Pt out of meds, requested refill - atorvastatin refilled until visit.  Recommended to call if primary care feels cardiology visit warranted sooner - can arrange acute visit w/ extender. Pt voiced understanding. Routed to Dr. Jens Somrenshaw for any additional advice.

## 2016-04-06 NOTE — Telephone Encounter (Signed)
New message      Pt was under the understanding that a bp medication was going to be called in along with the cholesterol medication.  Please call

## 2016-05-12 ENCOUNTER — Encounter: Payer: Self-pay | Admitting: Cardiology

## 2016-05-12 NOTE — Progress Notes (Signed)
HPI: FU CAD, status post CABG 10/2012, carotid stenosis, HL. Patient had LIMA to the LAD, left radial to the obtuse marginal. Echocardiogram November 2013 showed normal LV function, grade 1 diastolic dysfunction and mild left atrial enlargement. Nuclear study in June of 2015 showed an ejection fraction of 52%. Apical thinning but no ischemia. Carotid Dopplers June 2015 showed 1-39% right stenosis. Since he was last seen, the patient has dyspnea with more extreme activities but not with routine activities. It is relieved with rest. It is not associated with chest pain. There is no orthopnea, PND or pedal edema. There is no syncope or palpitations. There is no exertional chest pain.   Current Outpatient Prescriptions  Medication Sig Dispense Refill  . aspirin 81 MG EC tablet Take 1 tablet (81 mg total) by mouth daily. 1 tablet   . atorvastatin (LIPITOR) 80 MG tablet Take 1 tablet (80 mg total) by mouth daily. 30 tablet 1  . hydrOXYzine (ATARAX/VISTARIL) 10 MG tablet Take 10 mg by mouth as needed.      No current facility-administered medications for this visit.     Past Medical History  Diagnosis Date  . Diverticulitis 02-2012    CT scan   . Fatty liver 02-2012    Mild- CT scan   . GERD (gastroesophageal reflux disease)   . Coronary artery disease     a.  LHC 10/27/12 demonstrated pLAD 70%, AV CFX occluded, pOM 80%, dCFX collateralized the LAD and RCA, RCA without obstructive lesions, EF 50% with inferobasal HK;  b.  Echo 10/30/12: EF 55-60%, grade 1 diastolic dysfunction, mild LAE, normal LV wall motion;  c. s/p CABG 10/31/12 with Dr. Dorris Fetch:  L-LAD, left radial-OM;   b.  ETT-Nuc (05/2014):  + ECG changes, no ischemia; low risk  . Childhood asthma   . Iron deficiency anemia 1980's  . Cluster headaches 1985    Mid 80's  . HLD (hyperlipidemia)   . Carotid stenosis     Pre-CABG Dopplers: RICA 40-59%;  Carotid US (05/2014):  R 1-39%; normal LICA  . Hx of cardiovascular stress test      ETT-Myovew (05/2014):  + ECG changes with down-sloping ST depression; images with apical thinning but no ischemia, EF 52%; Low Risk    Past Surgical History  Procedure Laterality Date  . Cardiac catheterization  10/27/2012  . Radial keratotomy  1985    bilaterally  . Coronary artery bypass graft  10/31/2012    Procedure: CORONARY ARTERY BYPASS GRAFTING (CABG);  Surgeon: Loreli Slot, MD;  Location: [facility] OR;  Service: Open Heart Surgery;  Laterality: N/A;  . Radial artery harvest  10/31/2012    Procedure: RADIAL ARTERY HARVEST;  Surgeon: Loreli Slot, MD;  Location: [facility] OR;  Service: Open Heart Surgery;  Laterality: Left;  . Left heart catheterization with coronary angiogram N/A 10/27/2012    Procedure: LEFT HEART CATHETERIZATION WITH CORONARY ANGIOGRAM;  Surgeon: Herby Abraham, MD;  Location: [facility] CATH LAB;  Service: Cardiovascular;  Laterality: N/A;    Social History   Social History  . Marital Status: Married    Spouse Name: N/A  . Number of Children: N/A  . Years of Education: N/A   Occupational History  . Forensic psychologist of [employer]   Social History Main Topics  . Smoking status: Former Smoker -- 0.12 packs/day for 1.5 years  . Smokeless tobacco: Never Used  . Alcohol Use: Yes  . Drug Use:  No  . Sexual Activity: Not on file   Other Topics Concern  . Not on file   Social History Narrative    Family History  Problem Relation Age of Onset  . Lung cancer Father   . Diabetes Mellitus II Paternal Grandmother     ROS: no fevers or chills, productive cough, hemoptysis, dysphasia, odynophagia, melena, hematochezia, dysuria, hematuria, rash, seizure activity, orthopnea, PND, pedal edema, claudication. Remaining systems are negative.  Physical Exam: Well-developed obese in no acute distress.  Skin is warm and dry.  HEENT is normal.  Neck is supple.  Chest is clear to auscultation with normal expansion.  Cardiovascular exam is regular  rate and rhythm.  Abdominal exam nontender or distended. No masses palpated. Extremities show no edema. neuro grossly intact  ECG Sinus bradycardia at a rate of 58. No ST changes.

## 2016-05-14 ENCOUNTER — Encounter: Payer: Self-pay | Admitting: Cardiology

## 2016-05-14 ENCOUNTER — Ambulatory Visit (INDEPENDENT_AMBULATORY_CARE_PROVIDER_SITE_OTHER): Payer: PRIVATE HEALTH INSURANCE | Admitting: Cardiology

## 2016-05-14 VITALS — BP 172/75 | HR 50 | Ht 72.0 in | Wt 283.2 lb

## 2016-05-14 DIAGNOSIS — I2510 Atherosclerotic heart disease of native coronary artery without angina pectoris: Secondary | ICD-10-CM | POA: Diagnosis not present

## 2016-05-14 DIAGNOSIS — I679 Cerebrovascular disease, unspecified: Secondary | ICD-10-CM

## 2016-05-14 DIAGNOSIS — E785 Hyperlipidemia, unspecified: Secondary | ICD-10-CM | POA: Diagnosis not present

## 2016-05-14 DIAGNOSIS — I10 Essential (primary) hypertension: Secondary | ICD-10-CM | POA: Diagnosis not present

## 2016-05-14 MED ORDER — LISINOPRIL 10 MG PO TABS: 10.0000 mg | ORAL_TABLET | Freq: Every day | ORAL | Status: DC

## 2016-05-14 NOTE — Patient Instructions (Addendum)
Medication Instructions:   START LISINOPRIL 10 MG ONCE DAILY  Labwork:  Your physician recommends that you return for lab work in: ONE WEEK= DO NOT EAT PRIOR TO LAB WORK  Testing/Procedures:  Your physician has requested that you have a carotid duplex. This test is an ultrasound of the carotid arteries in your neck. It looks at blood flow through these arteries that supply the brain with blood. Allow one hour for this exam. There are no restrictions or special instructions.SCHEDULE AFTER June 30TH    Follow-Up:  Your physician wants you to follow-up in: ONE YEAR WITH DR Shelda PalRENSHAW You will receive a reminder letter in the mail two months in advance. If you don't receive a letter, please call our office to schedule the follow-up appointment.   Any Other Special Instructions Will Be Listed Below (If Applicable).

## 2016-05-14 NOTE — Assessment & Plan Note (Signed)
Continue statin. Check lipids and liver. 

## 2016-05-14 NOTE — Assessment & Plan Note (Signed)
Continue aspirin and statin. 

## 2016-05-14 NOTE — Assessment & Plan Note (Signed)
Follow-up carotid Dopplers June 2017. 

## 2016-05-14 NOTE — Assessment & Plan Note (Signed)
Blood pressure elevated. Add lisinopril 10 mg daily. Check potassium and renal function in 1 week. 

## 2016-06-02 ENCOUNTER — Other Ambulatory Visit: Payer: Self-pay | Admitting: Cardiology

## 2016-06-02 NOTE — Telephone Encounter (Signed)
Rx Refill

## 2016-06-11 ENCOUNTER — Telehealth: Payer: Self-pay | Admitting: Internal Medicine

## 2016-06-11 NOTE — Telephone Encounter (Signed)
Scheduled patient

## 2016-06-11 NOTE — Telephone Encounter (Signed)
Would like to know if you can take on as patient.  °

## 2016-06-11 NOTE — Telephone Encounter (Signed)
yes

## 2016-06-12 ENCOUNTER — Other Ambulatory Visit: Payer: Self-pay | Admitting: Cardiology

## 2016-06-12 DIAGNOSIS — I679 Cerebrovascular disease, unspecified: Secondary | ICD-10-CM

## 2016-06-12 DIAGNOSIS — I6523 Occlusion and stenosis of bilateral carotid arteries: Secondary | ICD-10-CM

## 2016-06-24 ENCOUNTER — Inpatient Hospital Stay (HOSPITAL_COMMUNITY): Admission: RE | Admit: 2016-06-24 | Payer: PRIVATE HEALTH INSURANCE | Source: Ambulatory Visit

## 2016-06-24 DIAGNOSIS — R0989 Other specified symptoms and signs involving the circulatory and respiratory systems: Secondary | ICD-10-CM

## 2016-07-14 ENCOUNTER — Ambulatory Visit (HOSPITAL_COMMUNITY)
Admission: RE | Admit: 2016-07-14 | Discharge: 2016-07-14 | Disposition: A | Discharge status: Home / Self Care | Payer: PRIVATE HEALTH INSURANCE | Source: Ambulatory Visit | Attending: Cardiovascular Disease | Admitting: Cardiovascular Disease

## 2016-07-14 DIAGNOSIS — E785 Hyperlipidemia, unspecified: Secondary | ICD-10-CM | POA: Insufficient documentation

## 2016-07-14 DIAGNOSIS — I2510 Atherosclerotic heart disease of native coronary artery without angina pectoris: Secondary | ICD-10-CM | POA: Diagnosis not present

## 2016-07-14 DIAGNOSIS — I679 Cerebrovascular disease, unspecified: Secondary | ICD-10-CM

## 2016-07-14 DIAGNOSIS — K219 Gastro-esophageal reflux disease without esophagitis: Secondary | ICD-10-CM | POA: Insufficient documentation

## 2016-07-14 DIAGNOSIS — I6523 Occlusion and stenosis of bilateral carotid arteries: Secondary | ICD-10-CM | POA: Insufficient documentation

## 2016-08-26 ENCOUNTER — Ambulatory Visit: Payer: PRIVATE HEALTH INSURANCE | Admitting: Internal Medicine

## 2016-09-29 ENCOUNTER — Encounter: Payer: Self-pay | Admitting: Internal Medicine

## 2016-09-29 ENCOUNTER — Other Ambulatory Visit (INDEPENDENT_AMBULATORY_CARE_PROVIDER_SITE_OTHER): Payer: PRIVATE HEALTH INSURANCE

## 2016-09-29 ENCOUNTER — Telehealth: Payer: Self-pay

## 2016-09-29 ENCOUNTER — Ambulatory Visit (INDEPENDENT_AMBULATORY_CARE_PROVIDER_SITE_OTHER): Payer: PRIVATE HEALTH INSURANCE | Admitting: Internal Medicine

## 2016-09-29 VITALS — BP 134/70 | HR 66 | Temp 98.5°F | Resp 16 | Ht 72.0 in | Wt 283.4 lb

## 2016-09-29 DIAGNOSIS — R7303 Prediabetes: Secondary | ICD-10-CM | POA: Diagnosis not present

## 2016-09-29 DIAGNOSIS — L239 Allergic contact dermatitis, unspecified cause: Secondary | ICD-10-CM

## 2016-09-29 DIAGNOSIS — I2510 Atherosclerotic heart disease of native coronary artery without angina pectoris: Secondary | ICD-10-CM

## 2016-09-29 DIAGNOSIS — Z0000 Encounter for general adult medical examination without abnormal findings: Secondary | ICD-10-CM

## 2016-09-29 DIAGNOSIS — I10 Essential (primary) hypertension: Secondary | ICD-10-CM

## 2016-09-29 DIAGNOSIS — E785 Hyperlipidemia, unspecified: Secondary | ICD-10-CM

## 2016-09-29 DIAGNOSIS — Z23 Encounter for immunization: Secondary | ICD-10-CM

## 2016-09-29 DIAGNOSIS — E118 Type 2 diabetes mellitus with unspecified complications: Secondary | ICD-10-CM | POA: Insufficient documentation

## 2016-09-29 LAB — CBC WITH DIFFERENTIAL/PLATELET
BASOS ABS: 0 10*3/uL (ref 0.0–0.1)
BASOS PCT: 0.6 % (ref 0.0–3.0)
EOS ABS: 0.1 10*3/uL (ref 0.0–0.7)
Eosinophils Relative: 2.8 % (ref 0.0–5.0)
HEMATOCRIT: 39.5 % (ref 39.0–52.0)
HEMOGLOBIN: 13 g/dL (ref 13.0–17.0)
Lymphocytes Relative: 48.5 % — ABNORMAL HIGH (ref 12.0–46.0)
Lymphs Abs: 2.1 10*3/uL (ref 0.7–4.0)
MCHC: 33 g/dL (ref 30.0–36.0)
MCV: 87.3 fl (ref 78.0–100.0)
MONO ABS: 0.4 10*3/uL (ref 0.1–1.0)
Monocytes Relative: 9.9 % (ref 3.0–12.0)
Neutro Abs: 1.6 10*3/uL (ref 1.4–7.7)
Neutrophils Relative %: 38.2 % — ABNORMAL LOW (ref 43.0–77.0)
PLATELETS: 245 10*3/uL (ref 150.0–400.0)
RBC: 4.53 Mil/uL (ref 4.22–5.81)
RDW: 16 % — AB (ref 11.5–15.5)
WBC: 4.2 10*3/uL (ref 4.0–10.5)

## 2016-09-29 LAB — COMPREHENSIVE METABOLIC PANEL
ALT: 17 U/L (ref 0–53)
AST: 16 U/L (ref 0–37)
Albumin: 3.8 g/dL (ref 3.5–5.2)
Alkaline Phosphatase: 70 U/L (ref 39–117)
BUN: 14 mg/dL (ref 6–23)
CO2: 23 mEq/L (ref 19–32)
CREATININE: 0.81 mg/dL (ref 0.40–1.50)
Calcium: 9.1 mg/dL (ref 8.4–10.5)
Chloride: 107 mEq/L (ref 96–112)
GFR: 126.01 mL/min (ref >60.00)
Glucose, Bld: 104 mg/dL — ABNORMAL HIGH (ref 70–99)
Potassium: 4.1 mEq/L (ref 3.5–5.1)
SODIUM: 139 meq/L (ref 135–145)
TOTAL PROTEIN: 6.8 g/dL (ref 6.0–8.3)
Total Bilirubin: 0.4 mg/dL (ref 0.2–1.2)

## 2016-09-29 LAB — PSA: PSA: 0.37 ng/mL (ref 0.10–4.00)

## 2016-09-29 LAB — TSH: TSH: 1.56 u[IU]/mL (ref 0.35–4.50)

## 2016-09-29 LAB — HEMOGLOBIN A1C: HEMOGLOBIN A1C: 6.2 % (ref 4.6–6.5)

## 2016-09-29 MED ORDER — ATORVASTATIN CALCIUM 80 MG PO TABS: ORAL_TABLET | ORAL | 3 refills | Status: DC

## 2016-09-29 MED ORDER — TRIAMCINOLONE ACETONIDE 0.1 % EX CREA: TOPICAL_CREAM | Freq: Two times a day (BID) | CUTANEOUS | 11 refills | Status: DC

## 2016-09-29 NOTE — Telephone Encounter (Signed)
Order # [id number]

## 2016-09-29 NOTE — Progress Notes (Signed)
Pre visit review using our clinic review tool, if applicable. No additional management support is needed unless otherwise documented below in the visit note. 

## 2016-09-29 NOTE — Progress Notes (Signed)
Subjective:  Patient ID: Bradley Watson, male    DOB: 08-May-1961  Age: 55 y.o. MRN: [id number]  CC: Annual Exam; Hypertension; Hyperlipidemia; and Rash  NEW TO ME  HPI GIL INGWERSEN presents for a CPX.  He has a history of eczema and complains of chronic recurrent rash. He needs a refill on his topical corticosteroid which helps.  He has a history of HTN and tells me his blood pressure has been well controlled on lisinopril. He denies any recent episodes of headache, blurred vision, chest pain, shortness of breath, palpitations, edema, or fatigue.  He is taking a statin for hypercholesterolemia and tolerates itwell with no muscle or joint aches.  Outpatient Medications Prior to Visit  Medication Sig Dispense Refill  . aspirin 81 MG EC tablet Take 1 tablet (81 mg total) by mouth daily. 1 tablet   . lisinopril (PRINIVIL,ZESTRIL) 10 MG tablet Take 1 tablet (10 mg total) by mouth daily. 90 tablet 3  . omeprazole (PRILOSEC) 20 MG capsule Take 20 mg by mouth daily.    Marland Kitchen atorvastatin (LIPITOR) 80 MG tablet TAKE 1 TABLET (80 MG TOTAL) BY MOUTH DAILY. 90 tablet 1  . hydrOXYzine (ATARAX/VISTARIL) 10 MG tablet Take 10 mg by mouth as needed.     . triamcinolone cream (KENALOG) 0.1 % Apply topically.     No facility-administered medications prior to visit.     ROS Review of Systems  Constitutional: Negative.  Negative for appetite change, chills, diaphoresis, fatigue and fever.  HENT: Negative.  Negative for sinus pressure and trouble swallowing.   Eyes: Negative.  Negative for photophobia and visual disturbance.  Respiratory: Negative.  Negative for cough, choking, chest tightness, shortness of breath and stridor.   Cardiovascular: Negative.  Negative for chest pain, palpitations and leg swelling.  Gastrointestinal: Negative.  Negative for abdominal pain, constipation, diarrhea, nausea and vomiting.  Endocrine: Negative.   Genitourinary: Negative.  Negative for difficulty urinating,  dysuria, frequency, penile pain, penile swelling, scrotal swelling, testicular pain and urgency.  Musculoskeletal: Negative.  Negative for arthralgias, myalgias and neck stiffness.  Skin: Positive for rash. Negative for color change, pallor and wound.  Allergic/Immunologic: Negative.   Neurological: Negative.  Negative for dizziness, weakness, numbness and headaches.  Hematological: Negative.  Negative for adenopathy. Does not bruise/bleed easily.  Psychiatric/Behavioral: Negative.     Objective:  BP 134/70 (BP Location: Left Arm, Patient Position: Sitting, Cuff Size: Large)   Pulse 66   Temp 98.5 F (36.9 C) (Oral)   Resp 16   Ht 6' (1.829 m)   Wt 283 lb 6 oz (128.5 kg)   SpO2 98%   BMI 38.43 kg/m   BP Readings from Last 3 Encounters:  09/29/16 134/70  05/14/16 (!) 172/75  08/23/14 118/75    Wt Readings from Last 3 Encounters:  09/29/16 283 lb 6 oz (128.5 kg)  05/14/16 283 lb 3.2 oz (128.5 kg)  08/23/14 270 lb (122.5 kg)    Physical Exam  Constitutional: He is oriented to person, place, and time. He appears well-developed and well-nourished. No distress.  HENT:  Head: Normocephalic and atraumatic.  Mouth/Throat: Oropharynx is clear and moist. No oropharyngeal exudate.  Eyes: Conjunctivae are normal. Right eye exhibits no discharge. Left eye exhibits no discharge. No scleral icterus.  Neck: Normal range of motion. Neck supple. No JVD present. No tracheal deviation present. No thyromegaly present.  Cardiovascular: Normal rate, regular rhythm, normal heart sounds and intact distal pulses.  Exam reveals no gallop and no  friction rub.   No murmur heard. Pulmonary/Chest: Effort normal and breath sounds normal. No stridor. No respiratory distress. He has no wheezes. He has no rales. He exhibits no tenderness.  Abdominal: Soft. Bowel sounds are normal. He exhibits no distension and no mass. There is no tenderness. There is no rebound and no guarding. Hernia confirmed negative in  the right inguinal area and confirmed negative in the left inguinal area.  Genitourinary: Rectum normal, prostate normal, testes normal and penis normal. Rectal exam shows no external hemorrhoid, no internal hemorrhoid, no fissure, no mass, no tenderness, anal tone normal and guaiac negative stool. Prostate is not enlarged and not tender. Right testis shows no mass, no swelling and no tenderness. Right testis is descended. Left testis shows no mass, no swelling and no tenderness. Left testis is descended. Circumcised. No penile erythema or penile tenderness. No discharge found.  Genitourinary Comments: Marked, bilateral testicular atrophy  Musculoskeletal: Normal range of motion. He exhibits no edema, tenderness or deformity.  Lymphadenopathy:    He has no cervical adenopathy.       Right: No inguinal adenopathy present.       Left: No inguinal adenopathy present.  Neurological: He is oriented to person, place, and time.  Skin: Skin is warm, dry and intact. Rash noted. No purpura noted. Rash is papular. Rash is not macular, not maculopapular, not nodular, not pustular, not vesicular and not urticarial. He is not diaphoretic. No cyanosis or erythema. No pallor. Nails show no clubbing.  Large areas of smooth hyperpigmentation over the upper arms and upper torso with scale, xerosis, and a few tiny papules  Psychiatric: He has a normal mood and affect. His behavior is normal. Judgment and thought content normal.  Vitals reviewed.   Lab Results  Component Value Date   WBC 4.2 09/29/2016   HGB 13.0 09/29/2016   HCT 39.5 09/29/2016   PLT 245.0 09/29/2016   GLUCOSE 104 (H) 09/29/2016   CHOL 200 07/17/2014   TRIG 66 07/17/2014   HDL 50 07/17/2014   LDLCALC 137 (H) 07/17/2014   ALT 17 09/29/2016   AST 16 09/29/2016   NA 139 09/29/2016   K 4.1 09/29/2016   CL 107 09/29/2016   CREATININE 0.81 09/29/2016   BUN 14 09/29/2016   CO2 23 09/29/2016   TSH 1.56 09/29/2016   PSA 0.37 09/29/2016   INR  1.47 10/31/2012   HGBA1C 6.2 09/29/2016    No results found.  Assessment & Plan:   Ladarrell was seen today for annual exam, hypertension, hyperlipidemia and rash.  Diagnoses and all orders for this visit:  Atherosclerosis of native coronary artery of native heart without angina pectoris- he has had no anginal symptoms lately, will continue to mitigate his risk factors with the ACE inhibitor, statin , and aspirin therapy. -     atorvastatin (LIPITOR) 80 MG tablet; TAKE 1 TABLET (80 MG TOTAL) BY MOUTH DAILY.  Essential hypertension- his blood pressure is well-controlled, electrolytes and renal function are stable.  Hyperlipidemia LDL goal <70- will continue statin therapy. -     atorvastatin (LIPITOR) 80 MG tablet; TAKE 1 TABLET (80 MG TOTAL) BY MOUTH DAILY.  Prediabetes- his A1c is at 6.1%, he is prediabetic, no medications are needed at this time, he agrees to work on his lifestyle modifications. -     Hemoglobin A1c; Future  Routine general medical examination at a health care facility- exam completed, labs ordered and reviewed, vaccines reviewed and updated, Cologuard was ordered to  screen for colon cancer, patient education material was given. -     Lipid panel; Future -     Comprehensive metabolic panel; Future -     CBC with Differential/Platelet; Future -     PSA; Future -     TSH; Future -     Hepatitis C antibody; Future -     HIV antibody; Future  Eczema, allergic -     triamcinolone cream (KENALOG) 0.1 %; Apply topically 2 (two) times daily.  Need for prophylactic vaccination and inoculation against influenza -     Flu Vaccine QUAD 36+ mos IM  Need for prophylactic vaccination with combined diphtheria-tetanus-pertussis (DTP) vaccine -     Tdap vaccine greater than or equal to 7yo IM   I have discontinued Mr. Mckiernan's hydrOXYzine. I have also changed his triamcinolone cream. Additionally, I am having him maintain his aspirin, omeprazole, lisinopril, and  atorvastatin.  Meds ordered this encounter  Medications  . triamcinolone cream (KENALOG) 0.1 %    Sig: Apply topically 2 (two) times daily.    Dispense:  100 g    Refill:  11  . atorvastatin (LIPITOR) 80 MG tablet    Sig: TAKE 1 TABLET (80 MG TOTAL) BY MOUTH DAILY.    Dispense:  90 tablet    Refill:  3     Follow-up: Return in about 6 months (around 03/30/2017).  Sanda Lingerhomas Cylinda Santoli, MD

## 2016-09-29 NOTE — Patient Instructions (Signed)

## 2016-09-30 ENCOUNTER — Encounter: Payer: Self-pay | Admitting: Internal Medicine

## 2016-09-30 LAB — HEPATITIS C ANTIBODY: HCV AB: NEGATIVE

## 2016-09-30 LAB — HIV ANTIBODY (ROUTINE TESTING W REFLEX): HIV: NONREACTIVE

## 2016-10-26 LAB — COLOGUARD: Cologuard: NEGATIVE

## 2016-12-11 ENCOUNTER — Encounter: Payer: Self-pay | Admitting: Internal Medicine

## 2016-12-11 NOTE — Progress Notes (Signed)
Abstracted result and sent to scan  

## 2016-12-25 NOTE — Telephone Encounter (Signed)
Received results

## 2017-03-31 ENCOUNTER — Ambulatory Visit (INDEPENDENT_AMBULATORY_CARE_PROVIDER_SITE_OTHER): Payer: PRIVATE HEALTH INSURANCE | Admitting: Internal Medicine

## 2017-03-31 ENCOUNTER — Encounter: Payer: Self-pay | Admitting: Internal Medicine

## 2017-03-31 ENCOUNTER — Other Ambulatory Visit (INDEPENDENT_AMBULATORY_CARE_PROVIDER_SITE_OTHER): Payer: PRIVATE HEALTH INSURANCE

## 2017-03-31 VITALS — BP 118/70 | HR 86 | Temp 99.3°F | Resp 16 | Ht 72.0 in | Wt 277.0 lb

## 2017-03-31 DIAGNOSIS — R0789 Other chest pain: Secondary | ICD-10-CM

## 2017-03-31 DIAGNOSIS — A09 Infectious gastroenteritis and colitis, unspecified: Secondary | ICD-10-CM

## 2017-03-31 DIAGNOSIS — R05 Cough: Secondary | ICD-10-CM | POA: Diagnosis not present

## 2017-03-31 DIAGNOSIS — R059 Cough, unspecified: Secondary | ICD-10-CM | POA: Insufficient documentation

## 2017-03-31 LAB — COMPREHENSIVE METABOLIC PANEL
ALBUMIN: 4.1 g/dL (ref 3.5–5.2)
ALT: 21 U/L (ref 0–53)
AST: 26 U/L (ref 0–37)
Alkaline Phosphatase: 80 U/L (ref 39–117)
BUN: 16 mg/dL (ref 6–23)
CALCIUM: 9.1 mg/dL (ref 8.4–10.5)
CHLORIDE: 104 meq/L (ref 96–112)
CO2: 23 mEq/L (ref 19–32)
CREATININE: 1.02 mg/dL (ref 0.40–1.50)
GFR: 96.41 mL/min (ref >60.00)
Glucose, Bld: 107 mg/dL — ABNORMAL HIGH (ref 70–99)
Potassium: 3.9 mEq/L (ref 3.5–5.1)
Sodium: 136 mEq/L (ref 135–145)
Total Bilirubin: 0.2 mg/dL (ref 0.2–1.2)
Total Protein: 7.9 g/dL (ref 6.0–8.3)

## 2017-03-31 LAB — CBC WITH DIFFERENTIAL/PLATELET
BASOS ABS: 0 10*3/uL (ref 0.0–0.1)
BASOS PCT: 1.2 % (ref 0.0–3.0)
EOS ABS: 0 10*3/uL (ref 0.0–0.7)
Eosinophils Relative: 0.9 % (ref 0.0–5.0)
HEMATOCRIT: 44.7 % (ref 39.0–52.0)
HEMOGLOBIN: 14.6 g/dL (ref 13.0–17.0)
LYMPHS PCT: 43.4 % (ref 12.0–46.0)
Lymphs Abs: 1.4 10*3/uL (ref 0.7–4.0)
MCHC: 32.6 g/dL (ref 30.0–36.0)
MCV: 89 fl (ref 78.0–100.0)
MONOS PCT: 11.1 % (ref 3.0–12.0)
Monocytes Absolute: 0.4 10*3/uL (ref 0.1–1.0)
Neutro Abs: 1.4 10*3/uL (ref 1.4–7.7)
Neutrophils Relative %: 43.4 % (ref 43.0–77.0)
Platelets: 228 10*3/uL (ref 150.0–400.0)
RBC: 5.02 Mil/uL (ref 4.22–5.81)
RDW: 16 % — AB (ref 11.5–15.5)
WBC: 3.3 10*3/uL — AB (ref 4.0–10.5)

## 2017-03-31 LAB — CARDIAC PANEL
CK-MB: 3.2 ng/mL (ref 0.3–4.0)
Relative Index: 1 calc (ref 0.0–2.5)
Total CK: 319 U/L — ABNORMAL HIGH (ref 7–232)

## 2017-03-31 LAB — TROPONIN I: TNIDX: 0.04 ug/l (ref 0.00–0.06)

## 2017-03-31 NOTE — Progress Notes (Signed)
Subjective:  Patient ID: Bradley Watson, male    DOB: May 11, 1961  Age: 56 y.o. MRN: [id number]  CC: Diarrhea and Chest Pain  NEW TO ME  HPI ASHTIN ROSNER presents for concerns about a 3 day history of nonproductive cough, chills, sore throat, headache, dizziness, chest pain that he describes as an achy sensation under his sternum, 2-3 episodes of watery diarrhea per hour with nausea but no vomiting or loss of appetite. He also complains of crampy abdominal pain. He denies melena or blood in his stools.  Outpatient Medications Prior to Visit  Medication Sig Dispense Refill  . aspirin 81 MG EC tablet Take 1 tablet (81 mg total) by mouth daily. 1 tablet   . atorvastatin (LIPITOR) 80 MG tablet TAKE 1 TABLET (80 MG TOTAL) BY MOUTH DAILY. 90 tablet 3  . lisinopril (PRINIVIL,ZESTRIL) 10 MG tablet Take 1 tablet (10 mg total) by mouth daily. 90 tablet 3  . omeprazole (PRILOSEC) 20 MG capsule Take 20 mg by mouth daily.    Marland Kitchen triamcinolone cream (KENALOG) 0.1 % Apply topically 2 (two) times daily. 100 g 11   No facility-administered medications prior to visit.     ROS Review of Systems  Constitutional: Negative for activity change, appetite change, chills, diaphoresis, fatigue, fever and unexpected weight change.  HENT: Positive for sore throat. Negative for facial swelling, sinus pressure and trouble swallowing.   Eyes: Negative for visual disturbance.  Respiratory: Positive for cough. Negative for apnea, choking, chest tightness, shortness of breath, wheezing and stridor.   Cardiovascular: Positive for chest pain.  Gastrointestinal: Positive for abdominal pain, diarrhea and nausea. Negative for blood in stool and vomiting.  Endocrine: Negative.   Genitourinary: Negative.  Negative for difficulty urinating, frequency, hematuria, testicular pain and urgency.  Musculoskeletal: Negative.  Negative for back pain and myalgias.  Skin: Negative.   Allergic/Immunologic: Negative.   Neurological:  Positive for dizziness. Negative for tremors, syncope, weakness, light-headedness, numbness and headaches.  Hematological: Negative.  Negative for adenopathy. Does not bruise/bleed easily.  Psychiatric/Behavioral: Negative.     Objective:  BP 118/70 (BP Location: Left Arm, Patient Position: Sitting, Cuff Size: Large)   Pulse 86   Temp 99.3 F (37.4 C) (Oral)   Resp 16   Ht 6' (1.829 m)   Wt 277 lb (125.6 kg)   SpO2 98%   BMI 37.57 kg/m   BP Readings from Last 3 Encounters:  03/31/17 118/70  09/29/16 134/70  05/14/16 (!) 172/75    Wt Readings from Last 3 Encounters:  03/31/17 277 lb (125.6 kg)  09/29/16 283 lb 6 oz (128.5 kg)  05/14/16 283 lb 3.2 oz (128.5 kg)    Physical Exam  Constitutional: He is oriented to person, place, and time.  Non-toxic appearance. He does not have a sickly appearance. He does not appear ill. No distress.  HENT:  Mouth/Throat: Oropharynx is clear and moist. No oropharyngeal exudate.  Eyes: Conjunctivae are normal. Right eye exhibits no discharge. Left eye exhibits no discharge. No scleral icterus.  Neck: Normal range of motion. Neck supple. No JVD present. No tracheal deviation present. No thyromegaly present.  Cardiovascular: Normal rate, regular rhythm, normal heart sounds and intact distal pulses.  Exam reveals no gallop and no friction rub.   No murmur heard. EKG ---  Sinus  Rhythm  - occasional ectopic ventricular beat    WITHIN NORMAL LIMITS  Pulmonary/Chest: Effort normal and breath sounds normal. No stridor. No respiratory distress. He has no wheezes. He  has no rales. He exhibits no mass, no tenderness, no bony tenderness, no edema, no deformity and no retraction.  Abdominal: Soft. Bowel sounds are normal. He exhibits no distension and no mass. There is no tenderness. There is no rebound and no guarding.  Musculoskeletal: Normal range of motion. He exhibits no edema, tenderness or deformity.  Lymphadenopathy:    He has no cervical  adenopathy.  Neurological: He is oriented to person, place, and time.  Skin: Skin is warm and dry. No rash noted. He is not diaphoretic. No erythema. No pallor.  Vitals reviewed.   Lab Results  Component Value Date   WBC 3.3 (L) 03/31/2017   HGB 14.6 03/31/2017   HCT 44.7 03/31/2017   PLT 228.0 03/31/2017   GLUCOSE 107 (H) 03/31/2017   CHOL 200 07/17/2014   TRIG 66 07/17/2014   HDL 50 07/17/2014   LDLCALC 137 (H) 07/17/2014   ALT 21 03/31/2017   AST 26 03/31/2017   NA 136 03/31/2017   K 3.9 03/31/2017   CL 104 03/31/2017   CREATININE 1.02 03/31/2017   BUN 16 03/31/2017   CO2 23 03/31/2017   TSH 1.56 09/29/2016   PSA 0.37 09/29/2016   INR 1.47 10/31/2012   HGBA1C 6.2 09/29/2016    No results found.  Assessment & Plan:   Yancey was seen today for diarrhea and chest pain.  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 Also, his cardiac enzymes are negative for any signs of ischemia. -     EKG 12-Lead -     Cardiac panel; Future -     Troponin I; Future  Diarrhea of infectious origin- his signs and symptoms are consistent with viral gastroenteritis, his lab work is all within normal limits, I will check a GI panel to screen for enteric pathogens. -     Comprehensive metabolic panel; Future -     CBC with Differential/Platelet; Future -     GI pathogen panel by PCR, stool; Future -     Fecal lactoferrin, quant; Future  Cough- His chest x-rays normal, this is most consistent with a viral upper respiratory infection. -     DG Chest 2 View; Future   I am having Mr. Broker maintain his aspirin, omeprazole, lisinopril, triamcinolone cream, and atorvastatin.  No orders of the defined types were placed in this encounter.    Follow-up: Return if symptoms worsen or  fail to improve.  Sanda Linger, MD

## 2017-03-31 NOTE — Progress Notes (Signed)
Pre visit review using our clinic review tool, if applicable. No additional management support is needed unless otherwise documented below in the visit note. 

## 2017-03-31 NOTE — Patient Instructions (Signed)

## 2017-04-01 ENCOUNTER — Encounter: Payer: Self-pay | Admitting: Internal Medicine

## 2017-04-15 ENCOUNTER — Telehealth: Payer: Self-pay | Admitting: Internal Medicine

## 2017-04-15 NOTE — Telephone Encounter (Signed)
I have not evaluated him for fatigue so I don't know

## 2017-04-15 NOTE — Telephone Encounter (Signed)
Pt would like results from 4/11.

## 2017-04-15 NOTE — Telephone Encounter (Signed)
Pt would like to know what else he can do for fatigue.

## 2017-04-16 NOTE — Telephone Encounter (Signed)
Patient had made an appointment for May 1 for low energy.

## 2017-04-20 ENCOUNTER — Ambulatory Visit (INDEPENDENT_AMBULATORY_CARE_PROVIDER_SITE_OTHER)
Admission: RE | Admit: 2017-04-20 | Discharge: 2017-04-20 | Disposition: A | Discharge status: Home / Self Care | Payer: PRIVATE HEALTH INSURANCE | Source: Ambulatory Visit | Attending: Internal Medicine | Admitting: Internal Medicine

## 2017-04-20 ENCOUNTER — Encounter: Payer: Self-pay | Admitting: Internal Medicine

## 2017-04-20 ENCOUNTER — Ambulatory Visit (INDEPENDENT_AMBULATORY_CARE_PROVIDER_SITE_OTHER): Payer: PRIVATE HEALTH INSURANCE | Admitting: Internal Medicine

## 2017-04-20 ENCOUNTER — Other Ambulatory Visit (INDEPENDENT_AMBULATORY_CARE_PROVIDER_SITE_OTHER): Payer: PRIVATE HEALTH INSURANCE

## 2017-04-20 VITALS — BP 140/88 | HR 80 | Temp 97.8°F | Ht 72.0 in | Wt 278.2 lb

## 2017-04-20 DIAGNOSIS — R05 Cough: Secondary | ICD-10-CM

## 2017-04-20 DIAGNOSIS — R059 Cough, unspecified: Secondary | ICD-10-CM

## 2017-04-20 DIAGNOSIS — R5383 Other fatigue: Secondary | ICD-10-CM | POA: Diagnosis not present

## 2017-04-20 DIAGNOSIS — I2510 Atherosclerotic heart disease of native coronary artery without angina pectoris: Secondary | ICD-10-CM

## 2017-04-20 DIAGNOSIS — E785 Hyperlipidemia, unspecified: Secondary | ICD-10-CM

## 2017-04-20 DIAGNOSIS — R7303 Prediabetes: Secondary | ICD-10-CM

## 2017-04-20 DIAGNOSIS — I10 Essential (primary) hypertension: Secondary | ICD-10-CM | POA: Diagnosis not present

## 2017-04-20 LAB — HEMOGLOBIN A1C: HEMOGLOBIN A1C: 6.7 % — AB (ref 4.6–6.5)

## 2017-04-20 LAB — LIPID PANEL
CHOLESTEROL: 179 mg/dL (ref 0–200)
HDL: 44.4 mg/dL (ref >39.00)
LDL CALC: 118 mg/dL — AB (ref 0–99)
NonHDL: 134.85
TRIGLYCERIDES: 84 mg/dL (ref 0.0–149.0)
Total CHOL/HDL Ratio: 4
VLDL: 16.8 mg/dL (ref 0.0–40.0)

## 2017-04-20 NOTE — Progress Notes (Signed)
Subjective:  Patient ID: Bradley Watson, male    DOB: March 29, 1961  Age: 56 y.o. MRN: [id number]  CC: Cough and Fatigue   HPI Bradley Watson presents for f/up - he tells me that his cough is much better but he still has a mild NP cough. He also complains of fatigue.  Outpatient Medications Prior to Visit  Medication Sig Dispense Refill  . aspirin 81 MG EC tablet Take 1 tablet (81 mg total) by mouth daily. 1 tablet   . atorvastatin (LIPITOR) 80 MG tablet TAKE 1 TABLET (80 MG TOTAL) BY MOUTH DAILY. 90 tablet 3  . omeprazole (PRILOSEC) 20 MG capsule Take 20 mg by mouth daily.    Marland Kitchen triamcinolone cream (KENALOG) 0.1 % Apply topically 2 (two) times daily. 100 g 11  . lisinopril (PRINIVIL,ZESTRIL) 10 MG tablet Take 1 tablet (10 mg total) by mouth daily. 90 tablet 3   No facility-administered medications prior to visit.     ROS Review of Systems  Constitutional: Positive for fatigue. Negative for appetite change, chills, diaphoresis, fever and unexpected weight change.  HENT: Positive for postnasal drip. Negative for rhinorrhea, sinus pain, sinus pressure, sneezing, sore throat and trouble swallowing.   Eyes: Negative.   Respiratory: Positive for cough. Negative for chest tightness, shortness of breath, wheezing and stridor.   Cardiovascular: Negative for chest pain, palpitations and leg swelling.  Gastrointestinal: Negative for abdominal pain, constipation, diarrhea, nausea and vomiting.  Endocrine: Negative.   Genitourinary: Negative.  Negative for difficulty urinating.  Musculoskeletal: Negative.  Negative for back pain and myalgias.  Skin: Negative.  Negative for color change and rash.  Neurological: Negative.  Negative for dizziness.  Hematological: Negative for adenopathy. Does not bruise/bleed easily.  Psychiatric/Behavioral: Negative.     Objective:  BP 140/88 (BP Location: Left Arm, Patient Position: Sitting, Cuff Size: Large)   Pulse 80   Temp 97.8 F (36.6 C) (Oral)    Ht 6' (1.829 m)   Wt 278 lb 4 oz (126.2 kg)   SpO2 99%   BMI 37.74 kg/m   BP Readings from Last 3 Encounters:  04/20/17 140/88  03/31/17 118/70  09/29/16 134/70    Wt Readings from Last 3 Encounters:  04/20/17 278 lb 4 oz (126.2 kg)  03/31/17 277 lb (125.6 kg)  09/29/16 283 lb 6 oz (128.5 kg)    Physical Exam  Constitutional: He is oriented to person, place, and time. No distress.  HENT:  Mouth/Throat: Oropharynx is clear and moist. No oropharyngeal exudate.  Eyes: Conjunctivae are normal. Right eye exhibits no discharge. Left eye exhibits no discharge. No scleral icterus.  Neck: Normal range of motion. Neck supple. No JVD present. No tracheal deviation present. No thyromegaly present.  Cardiovascular: Normal rate, regular rhythm, normal heart sounds and intact distal pulses.  Exam reveals no gallop and no friction rub.   No murmur heard. Pulses:      Carotid pulses are 1+ on the right side, and 1+ on the left side.      Radial pulses are 1+ on the right side, and 1+ on the left side.       Femoral pulses are 1+ on the right side, and 1+ on the left side.      Popliteal pulses are 1+ on the right side, and 1+ on the left side.       Dorsalis pedis pulses are 1+ on the right side, and 1+ on the left side.  Posterior tibial pulses are 1+ on the right side, and 1+ on the left side.  Pulmonary/Chest: Effort normal and breath sounds normal. No stridor. No respiratory distress. He has no wheezes. He has no rales. He exhibits no tenderness.  Abdominal: Soft. Bowel sounds are normal. He exhibits no distension and no mass. There is no tenderness. There is no rebound and no guarding.  Musculoskeletal: Normal range of motion. He exhibits no edema, tenderness or deformity.  Lymphadenopathy:    He has no cervical adenopathy.  Neurological: He is oriented to person, place, and time.  Skin: Skin is warm and dry. No rash noted. He is not diaphoretic. No erythema. No pallor.    Lab  Results  Component Value Date   WBC 3.3 (L) 03/31/2017   HGB 14.6 03/31/2017   HCT 44.7 03/31/2017   PLT 228.0 03/31/2017   GLUCOSE 107 (H) 03/31/2017   CHOL 179 04/20/2017   TRIG 84.0 04/20/2017   HDL 44.40 04/20/2017   LDLCALC 118 (H) 04/20/2017   ALT 21 03/31/2017   AST 26 03/31/2017   NA 136 03/31/2017   K 3.9 03/31/2017   CL 104 03/31/2017   CREATININE 1.02 03/31/2017   BUN 16 03/31/2017   CO2 23 03/31/2017   TSH 1.56 09/29/2016   PSA 0.37 09/29/2016   INR 1.47 10/31/2012   HGBA1C 6.7 (H) 04/20/2017    No results found.  Assessment & Plan:   Bradley Watson was seen today for cough and fatigue.  Diagnoses and all orders for this visit:  Cough- this is improving, I have asked him to stop taking the ACEI, his CXR is neg for mass, PNA -     DG Chest 2 View; Future  Fatigue, unspecified type- he does not complain of angina and his recent EKG was normal - I don't think the fatigue is related to CAD, I will check his labs to screen for metabolic causes -     Thyroid Panel With TSH; Future  Essential hypertension- his BP is well controlled  Atherosclerosis of native coronary artery of native heart without angina pectoris -     Lipid panel; Future  Hyperlipidemia LDL goal <70- he has not achieved his LDL goal, will d/w him starting a PCSK9 inh the next time I see him -     Lipid panel; Future  Prediabetes- his A1C is up to 6.7%, he has new onset DM2, no meds are needed to treat this -     Hemoglobin A1c; Future   I have discontinued Bradley Watson's lisinopril. I am also having him maintain his aspirin, omeprazole, triamcinolone cream, and atorvastatin.  No orders of the defined types were placed in this encounter.    Follow-up: Return in about 2 months (around 06/20/2017).  Sanda Linger, MD

## 2017-04-20 NOTE — Progress Notes (Signed)
Pre visit review using our clinic review tool, if applicable. No additional management support is needed unless otherwise documented below in the visit note. 

## 2017-04-20 NOTE — Patient Instructions (Signed)
Cough, Adult Coughing is a reflex that clears your throat and your airways. Coughing helps to heal and protect your lungs. It is normal to cough occasionally, but a cough that happens with other symptoms or lasts a long time may be a sign of a condition that needs treatment. A cough may last only 2-3 weeks (acute), or it may last longer than 8 weeks (chronic). What are the causes? Coughing is commonly caused by:  Breathing in substances that irritate your lungs.  A viral or bacterial respiratory infection.  Allergies.  Asthma.  Postnasal drip.  Smoking.  Acid backing up from the stomach into the esophagus (gastroesophageal reflux).  Certain medicines.  Chronic lung problems, including COPD (or rarely, lung cancer).  Other medical conditions such as heart failure.  Follow these instructions at home: Pay attention to any changes in your symptoms. Take these actions to help with your discomfort:  Take medicines only as told by your health care provider. ? If you were prescribed an antibiotic medicine, take it as told by your health care provider. Do not stop taking the antibiotic even if you start to feel better. ? Talk with your health care provider before you take a cough suppressant medicine.  Drink enough fluid to keep your urine clear or pale yellow.  If the air is dry, use a cold steam vaporizer or humidifier in your bedroom or your home to help loosen secretions.  Avoid anything that causes you to cough at work or at home.  If your cough is worse at night, try sleeping in a semi-upright position.  Avoid cigarette smoke. If you smoke, quit smoking. If you need help quitting, ask your health care provider.  Avoid caffeine.  Avoid alcohol.  Rest as needed.  Contact a health care provider if:  You have new symptoms.  You cough up pus.  Your cough does not get better after 2-3 weeks, or your cough gets worse.  You cannot control your cough with suppressant  medicines and you are losing sleep.  You develop pain that is getting worse or pain that is not controlled with pain medicines.  You have a fever.  You have unexplained weight loss.  You have night sweats. Get help right away if:  You cough up blood.  You have difficulty breathing.  Your heartbeat is very fast. This information is not intended to replace advice given to you by your health care provider. Make sure you discuss any questions you have with your health care provider. Document Released: 06/05/2011 Document Revised: 05/14/2016 Document Reviewed: 02/13/2015 Elsevier Interactive Patient Education  2017 [employer]  

## 2017-04-21 ENCOUNTER — Encounter: Payer: Self-pay | Admitting: Internal Medicine

## 2017-04-21 ENCOUNTER — Other Ambulatory Visit: Payer: PRIVATE HEALTH INSURANCE

## 2017-10-05 ENCOUNTER — Other Ambulatory Visit: Payer: Self-pay | Admitting: Internal Medicine

## 2017-10-05 DIAGNOSIS — E785 Hyperlipidemia, unspecified: Secondary | ICD-10-CM

## 2017-10-05 DIAGNOSIS — I2510 Atherosclerotic heart disease of native coronary artery without angina pectoris: Secondary | ICD-10-CM

## 2017-11-16 ENCOUNTER — Ambulatory Visit: Payer: PRIVATE HEALTH INSURANCE | Admitting: Internal Medicine

## 2018-04-09 ENCOUNTER — Emergency Department (HOSPITAL_BASED_OUTPATIENT_CLINIC_OR_DEPARTMENT_OTHER)
Admission: EM | Admit: 2018-04-09 | Discharge: 2018-04-09 | Disposition: A | Discharge status: Home / Self Care | Payer: PRIVATE HEALTH INSURANCE | Attending: Emergency Medicine | Admitting: Emergency Medicine

## 2018-04-09 ENCOUNTER — Encounter (HOSPITAL_BASED_OUTPATIENT_CLINIC_OR_DEPARTMENT_OTHER): Payer: Self-pay | Admitting: Emergency Medicine

## 2018-04-09 ENCOUNTER — Other Ambulatory Visit: Payer: Self-pay

## 2018-04-09 DIAGNOSIS — Z7982 Long term (current) use of aspirin: Secondary | ICD-10-CM | POA: Diagnosis not present

## 2018-04-09 DIAGNOSIS — I119 Hypertensive heart disease without heart failure: Secondary | ICD-10-CM | POA: Insufficient documentation

## 2018-04-09 DIAGNOSIS — Z79899 Other long term (current) drug therapy: Secondary | ICD-10-CM | POA: Insufficient documentation

## 2018-04-09 DIAGNOSIS — I951 Orthostatic hypotension: Secondary | ICD-10-CM | POA: Diagnosis not present

## 2018-04-09 DIAGNOSIS — R42 Dizziness and giddiness: Secondary | ICD-10-CM

## 2018-04-09 DIAGNOSIS — Z87891 Personal history of nicotine dependence: Secondary | ICD-10-CM | POA: Insufficient documentation

## 2018-04-09 DIAGNOSIS — I2510 Atherosclerotic heart disease of native coronary artery without angina pectoris: Secondary | ICD-10-CM | POA: Insufficient documentation

## 2018-04-09 LAB — CBC WITH DIFFERENTIAL/PLATELET
Basophils Absolute: 0 10*3/uL (ref 0.0–0.1)
Basophils Relative: 0 %
Eosinophils Absolute: 0.1 10*3/uL (ref 0.0–0.7)
Eosinophils Relative: 3 %
HEMATOCRIT: 38.9 % — AB (ref 39.0–52.0)
HEMOGLOBIN: 13.3 g/dL (ref 13.0–17.0)
LYMPHS PCT: 52 %
Lymphs Abs: 1.9 10*3/uL (ref 0.7–4.0)
MCH: 29.4 pg (ref 26.0–34.0)
MCHC: 34.2 g/dL (ref 30.0–36.0)
MCV: 85.9 fL (ref 78.0–100.0)
Monocytes Absolute: 0.4 10*3/uL (ref 0.1–1.0)
Monocytes Relative: 11 %
NEUTROS ABS: 1.2 10*3/uL — AB (ref 1.7–7.7)
NEUTROS PCT: 34 %
Platelets: 253 10*3/uL (ref 150–400)
RBC: 4.53 MIL/uL (ref 4.22–5.81)
RDW: 15.1 % (ref 11.5–15.5)
WBC: 3.7 10*3/uL — AB (ref 4.0–10.5)

## 2018-04-09 LAB — BASIC METABOLIC PANEL
ANION GAP: 8 (ref 5–15)
BUN: 17 mg/dL (ref 6–20)
CHLORIDE: 106 mmol/L (ref 101–111)
CO2: 23 mmol/L (ref 22–32)
Calcium: 8.9 mg/dL (ref 8.9–10.3)
Creatinine, Ser: 0.84 mg/dL (ref 0.61–1.24)
GFR calc Af Amer: >60 mL/min (ref >60)
GFR calc non Af Amer: >60 mL/min (ref >60)
GLUCOSE: 104 mg/dL — AB (ref 65–99)
POTASSIUM: 4 mmol/L (ref 3.5–5.1)
Sodium: 137 mmol/L (ref 135–145)

## 2018-04-09 LAB — CBG MONITORING, ED: Glucose-Capillary: 99 mg/dL (ref 65–99)

## 2018-04-09 NOTE — ED Triage Notes (Addendum)
Dizziness that started today with generalized weakness for a few days. Denies chest pain, SOB. Pt able to drive here without difficulty.

## 2018-04-09 NOTE — ED Provider Notes (Signed)
[facility] EMERGENCY DEPARTMENT Provider Note  CSN: [id number] Arrival date & time: 04/09/18 1143  Chief Complaint(s) Dizziness  HPI Bradley Watson is a 57 y.o. male   The history is provided by the patient.  Dizziness  Quality:  Lightheadedness Severity:  Moderate Onset quality:  Sudden Duration:  3 hours Timing:  Intermittent Progression:  Waxing and waning Chronicity:  New Relieved by:  Being still Worsened by:  Movement and standing up Associated symptoms: no blood in stool, no chest pain, no nausea, no palpitations, no shortness of breath, no syncope and no vision changes     Past Medical History Past Medical History:  Diagnosis Date  . Carotid stenosis    Pre-CABG Dopplers: RICA 40-59%;  Carotid US (05/2014):  R 1-39%; normal LICA  . Childhood asthma   . Cluster headaches 1985   Mid 80's  . Coronary artery disease    a.  LHC 10/27/12 demonstrated pLAD 70%, AV CFX occluded, pOM 80%, dCFX collateralized the LAD and RCA, RCA without obstructive lesions, EF 50% with inferobasal HK;  b.  Echo 10/30/12: EF 55-60%, grade 1 diastolic dysfunction, mild LAE, normal LV wall motion;  c. s/p CABG 10/31/12 with Dr. Dorris Fetch:  L-LAD, left radial-OM;   b.  ETT-Nuc (05/2014):  + ECG changes, no ischemia; low risk  . Diverticulitis 02-2012   CT scan   . Fatty liver 02-2012   Mild- CT scan   . GERD (gastroesophageal reflux disease)   . HLD (hyperlipidemia)   . Hx of cardiovascular stress test    ETT-Myovew (05/2014):  + ECG changes with down-sloping ST depression; images with apical thinning but no ischemia, EF 52%; Low Risk  . Iron deficiency anemia 1980's   Patient Active Problem List   Diagnosis Date Noted  . Fatigue 04/20/2017  . Cough 03/31/2017  . Prediabetes 09/29/2016  . Routine general medical examination at a health care facility 09/29/2016  . Eczema, allergic 09/29/2016  . Essential hypertension 05/14/2016  . Coronary atherosclerosis of native coronary  artery 11/21/2012  . Hyperlipidemia LDL goal <70 10/28/2012  . GERD (gastroesophageal reflux disease) 10/24/2012   Home Medication(s) Prior to Admission medications   Medication Sig Start Date End Date Taking? Authorizing Provider  ibuprofen (ADVIL,MOTRIN) 200 MG tablet Take 200 mg by mouth every 6 (six) hours as needed.   Yes [provider]  Pseudoeph-Doxylamine-DM-APAP (NYQUIL PO) Take by mouth.   Yes [provider]  Pseudoephedrine-APAP-DM (DAYQUIL PO) Take by mouth.   Yes [provider]  aspirin 81 MG EC tablet Take 1 tablet (81 mg total) by mouth daily. 01/13/13   Herby Abraham, MD  atorvastatin (LIPITOR) 80 MG tablet TAKE 1 TABLET (80 MG TOTAL) BY MOUTH DAILY. 10/05/17   [name], MD  omeprazole (PRILOSEC) 20 MG capsule Take 20 mg by mouth daily.    [provider]  triamcinolone cream (KENALOG) 0.1 % Apply topically 2 (two) times daily. 09/29/16   [name], MD  Past Surgical History Past Surgical History:  Procedure Laterality Date  . CARDIAC CATHETERIZATION  10/27/2012  . CORONARY ARTERY BYPASS GRAFT  10/31/2012   Procedure: CORONARY ARTERY BYPASS GRAFTING (CABG);  Surgeon: Loreli Slot, MD;  Location: [facility] OR;  Service: Open Heart Surgery;  Laterality: N/A;  . LEFT HEART CATHETERIZATION WITH CORONARY ANGIOGRAM N/A 10/27/2012   Procedure: LEFT HEART CATHETERIZATION WITH CORONARY ANGIOGRAM;  Surgeon: Herby Abraham, MD;  Location: [facility] CATH LAB;  Service: Cardiovascular;  Laterality: N/A;  . RADIAL ARTERY HARVEST  10/31/2012   Procedure: RADIAL ARTERY HARVEST;  Surgeon: Loreli Slot, MD;  Location: [facility] OR;  Service: Open Heart Surgery;  Laterality: Left;  . RADIAL KERATOTOMY  1985   bilaterally   Family History Family History  Problem Relation Age of Onset  . Lung cancer Father   .  Diabetes Mellitus II Paternal Grandmother     Social History Social History   Tobacco Use  . Smoking status: Former Smoker    Packs/day: 0.12    Years: 1.50    Pack years: 0.18  . Smokeless tobacco: Never Used  Substance Use Topics  . Alcohol use: Yes  . Drug use: No    Types: "Crack" cocaine   Allergies Penicillins and Lisinopril  Review of Systems Review of Systems  Respiratory: Negative for shortness of breath.   Cardiovascular: Negative for chest pain, palpitations and syncope.  Gastrointestinal: Negative for blood in stool and nausea.  Neurological: Positive for dizziness.   All other systems are reviewed and are negative for acute change except as noted in the HPI  Physical Exam Vital Signs  I have reviewed the triage vital signs BP (!) 144/96 (BP Location: Left Arm)   Pulse 67   Temp 98.2 F (36.8 C) (Oral)   Resp 18   Ht 6' (1.829 m)   Wt 127 kg (280 lb)   SpO2 96%   BMI 37.97 kg/m   Physical Exam  Constitutional: He is oriented to person, place, and time. He appears well-developed and well-nourished. No distress.  HENT:  Head: Normocephalic and atraumatic.  Nose: Nose normal.  Eyes: Pupils are equal, round, and reactive to light. Conjunctivae and EOM are normal. Right eye exhibits no discharge. Left eye exhibits no discharge. No scleral icterus.  Neck: Normal range of motion. Neck supple.  Cardiovascular: Normal rate and regular rhythm. Exam reveals no gallop and no friction rub.  No murmur heard. Pulmonary/Chest: Effort normal and breath sounds normal. No stridor. No respiratory distress. He has no rales.  Abdominal: Soft. He exhibits no distension. There is no tenderness.  Musculoskeletal: He exhibits no edema or tenderness.  Neurological: He is alert and oriented to person, place, and time.  Mental Status:  Alert and oriented to person, place, and time.  Attention and concentration normal.  Speech clear.  Recent memory is intact  Cranial  Nerves:  II Visual Fields: Intact to confrontation. Visual fields intact. III, IV, VI: Pupils equal and reactive to light and near. Full eye movement without nystagmus  V Facial Sensation: Normal. No weakness of masticatory muscles  VII: No facial weakness or asymmetry  VIII Auditory Acuity: Grossly normal  IX/X: The uvula is midline; the palate elevates symmetrically  XI: Normal sternocleidomastoid and trapezius strength  XII: The tongue is midline. No atrophy or fasciculations.   Motor System: Muscle Strength: 5/5 and symmetric in the upper and lower extremities. No pronation or drift.  Muscle Tone: Tone and muscle bulk are normal  in the upper and lower extremities.   Reflexes: DTRs: 1+ and symmetrical in all four extremities. No Clonus Coordination: Intact finger-to-nose, heel-to-shin. No tremor.  Sensation: Intact to light touch, and pinprick.  Gait: Routine and tandem gait normal.   Skin: Skin is warm and dry. No rash noted. He is not diaphoretic. No erythema.  Psychiatric: He has a normal mood and affect.  Vitals reviewed.   ED Results and Treatments Labs (all labs ordered are listed, but only abnormal results are displayed) Labs Reviewed  CBC WITH DIFFERENTIAL/PLATELET - Abnormal; Notable for the following components:      Result Value   WBC 3.7 (*)    HCT 38.9 (*)    Neutro Abs 1.2 (*)    All other components within normal limits  BASIC METABOLIC PANEL - Abnormal; Notable for the following components:   Glucose, Bld 104 (*)    All other components within normal limits  CBG MONITORING, ED                                                                                                                         EKG  EKG Interpretation  Date/Time:  Saturday April 09 2018 11:54:44 EDT Ventricular Rate:  68 PR Interval:  176 QRS Duration: 104 QT Interval:  390 QTC Calculation: 414 R Axis:   46 Text Interpretation:  Normal sinus rhythm Nonspecific T wave abnormality  Abnormal ECG No significant change since last tracing Confirmed by Drema Pry [phone]) on 04/09/2018 12:31:19 PM      Radiology No results found. Pertinent labs & imaging results that were available during my care of the patient were reviewed by me and considered in my medical decision making (see chart for details).  Medications Ordered in ED Medications - No data to display                                                                                                                                  Procedures Procedures  (including critical care time)  Medical Decision Making / ED Course I have reviewed the nursing notes for this encounter and the patient's prior records (if available in EHR or on provided paperwork).    Pt became symptomatic upon standing. Orthostatic pressure revealed mild orthostasis. Given oral hydration.  EKG w/o acute ischemia, dysrhythmias, or blocks. Doubt cardiac process.  Labs w/o significant electrolyte derangement or renal insufficiency. No anemia  noted.  Low suspicion for CVA or other central process.  The patient appears reasonably screened and/or stabilized for discharge and I doubt any other medical condition or other [facility] requiring further screening, evaluation, or treatment in the ED at this time prior to discharge.   Final Clinical Impression(s) / ED Diagnoses Final diagnoses:  Lightheadedness  Orthostasis    Disposition: Discharge  Condition: Good  I have discussed the results, Dx and Tx plan with the patient who expressed understanding and agree(s) with the plan. Discharge instructions discussed at great length. The patient was given strict return precautions who verbalized understanding of the instructions. No further questions at time of discharge.    ED Discharge Orders    None       Follow Up: [name], MD [address] [place] [address] [phone]  Schedule an appointment as soon as  possible for a visit  in 5-7 days, If symptoms do not improve or  worsen     This chart was dictated using voice recognition software.  Despite best efforts to proofread,  errors can occur which can change the documentation meaning.   Nira Conn, MD 04/09/18 [phone]

## 2018-04-09 NOTE — ED Notes (Signed)
ED Provider at bedside. 

## 2018-05-11 ENCOUNTER — Other Ambulatory Visit: Payer: Self-pay | Admitting: Internal Medicine

## 2018-05-11 DIAGNOSIS — I2510 Atherosclerotic heart disease of native coronary artery without angina pectoris: Secondary | ICD-10-CM

## 2018-05-11 DIAGNOSIS — E785 Hyperlipidemia, unspecified: Secondary | ICD-10-CM

## 2018-06-13 ENCOUNTER — Other Ambulatory Visit: Payer: Self-pay | Admitting: Internal Medicine

## 2018-06-13 DIAGNOSIS — E785 Hyperlipidemia, unspecified: Secondary | ICD-10-CM

## 2018-06-13 DIAGNOSIS — I2510 Atherosclerotic heart disease of native coronary artery without angina pectoris: Secondary | ICD-10-CM

## 2018-06-28 ENCOUNTER — Ambulatory Visit: Payer: PRIVATE HEALTH INSURANCE | Admitting: Internal Medicine

## 2018-06-28 ENCOUNTER — Encounter: Payer: Self-pay | Admitting: Internal Medicine

## 2018-06-28 DIAGNOSIS — M25562 Pain in left knee: Secondary | ICD-10-CM | POA: Diagnosis not present

## 2018-06-28 DIAGNOSIS — I10 Essential (primary) hypertension: Secondary | ICD-10-CM

## 2018-06-28 DIAGNOSIS — M25561 Pain in right knee: Secondary | ICD-10-CM

## 2018-06-28 MED ORDER — ACETAMINOPHEN-CODEINE 300-30 MG PO TABS: 1.0000 | ORAL_TABLET | Freq: Four times a day (QID) | ORAL | 0 refills | Status: DC | PRN

## 2018-06-28 NOTE — Progress Notes (Signed)
Subjective:    Patient ID: Bradley Watson, male    DOB: 02-10-1961, 57 y.o.   MRN: [id number]  HPI  Here after fall at [employer] gre[employer] floor bathroom, with slip and fall to right knee primarily, but also has some medial left knee pain later he thinks from favoring the right, and LBP now improved.  Pain still persists since then, not much better overall knee pain and naproxen not helping.  Pain overall right knee mild to mod, intermittent now though constant after the fall.  Walking makes worse, as well as getting in and out of cars he does for work, and pain is located primarily to anterior right knee and lateral knee. Seen per [facility] provider June 19 for back pain and bilat right > left pain, with back pain now much improved.  Has plain films done per [facility] neg for fx to both knees and lower back, and no other significant abnormality such as DJD.  Cant sleep on right side then just has to sleep on left.  Had some significant swelling, but now only  Trace to right knee.  Sore over the medial joint line per pt.  Has reduced ROM b/c has increased pain at full extension of the leg to the knee cap  Worries about knee giving way, but none so far and no falls.  No fever, No hx of gout.  Pt denies chest pain, increased sob or doe, wheezing, orthopnea, PND, increased LE swelling, palpitations, dizziness or syncope.   Pt denies polydipsia, polyuria Past Medical History:  Diagnosis Date  . Carotid stenosis    Pre-CABG Dopplers: RICA 40-59%;  Carotid US (05/2014):  R 1-39%; normal LICA  . Childhood asthma   . Cluster headaches 1985   Mid 80's  . Coronary artery disease    a.  LHC 10/27/12 demonstrated pLAD 70%, AV CFX occluded, pOM 80%, dCFX collateralized the LAD and RCA, RCA without obstructive lesions, EF 50% with inferobasal HK;  b.  Echo 10/30/12: EF 55-60%, grade 1 diastolic dysfunction, mild LAE, normal LV wall motion;  c. s/p CABG 10/31/12 with Dr. Dorris Fetch:  L-LAD, left radial-OM;   b.  ETT-Nuc  (05/2014):  + ECG changes, no ischemia; low risk  . Diverticulitis 02-2012   CT scan   . Fatty liver 02-2012   Mild- CT scan   . GERD (gastroesophageal reflux disease)   . HLD (hyperlipidemia)   . Hx of cardiovascular stress test    ETT-Myovew (05/2014):  + ECG changes with down-sloping ST depression; images with apical thinning but no ischemia, EF 52%; Low Risk  . Iron deficiency anemia 1980's   Past Surgical History:  Procedure Laterality Date  . CARDIAC CATHETERIZATION  10/27/2012  . CORONARY ARTERY BYPASS GRAFT  10/31/2012   Procedure: CORONARY ARTERY BYPASS GRAFTING (CABG);  Surgeon: Loreli Slot, MD;  Location: [facility] OR;  Service: Open Heart Surgery;  Laterality: N/A;  . LEFT HEART CATHETERIZATION WITH CORONARY ANGIOGRAM N/A 10/27/2012   Procedure: LEFT HEART CATHETERIZATION WITH CORONARY ANGIOGRAM;  Surgeon: Herby Abraham, MD;  Location: [facility] CATH LAB;  Service: Cardiovascular;  Laterality: N/A;  . RADIAL ARTERY HARVEST  10/31/2012   Procedure: RADIAL ARTERY HARVEST;  Surgeon: Loreli Slot, MD;  Location: [facility] OR;  Service: Open Heart Surgery;  Laterality: Left;  . RADIAL KERATOTOMY  1985   bilaterally    reports that he has quit smoking. He has a 0.18 pack-year smoking history. He has never used smokeless tobacco. He reports  that he drinks alcohol. He reports that he does not use drugs. family history includes Diabetes Mellitus II in his paternal grandmother; Lung cancer in his father. Allergies  Allergen Reactions  . Penicillins Anxiety and Other (See Comments)    "made me feel like gravity had increased and it was pulling me down" (10/27/2012)  . Lisinopril Cough   Current Outpatient Medications on File Prior to Visit  Medication Sig Dispense Refill  . aspirin 81 MG EC tablet Take 1 tablet (81 mg total) by mouth daily. 1 tablet   . atorvastatin (LIPITOR) 80 MG tablet TAKE 1 TABLET (80 MG TOTAL) BY MOUTH DAILY. 90 tablet 1  . ibuprofen (ADVIL,MOTRIN) 200 MG tablet  Take 200 mg by mouth every 6 (six) hours as needed.    . naproxen (NAPROSYN) 500 MG tablet Take 500 mg by mouth 2 (two) times daily with a meal.    . omeprazole (PRILOSEC) 20 MG capsule Take 20 mg by mouth daily.    . traMADol (ULTRAM) 50 MG tablet Take by mouth every 6 (six) hours as needed.    . triamcinolone cream (KENALOG) 0.1 % Apply topically 2 (two) times daily. 100 g 11   No current facility-administered medications on file prior to visit.    Review of Systems  Constitutional: Negative for other unusual diaphoresis or sweats HENT: Negative for ear discharge or swelling Eyes: Negative for other worsening visual disturbances Respiratory: Negative for stridor or other swelling  Gastrointestinal: Negative for worsening distension or other blood Genitourinary: Negative for retention or other urinary change Musculoskeletal: Negative for other MSK pain or swelling Skin: Negative for color change or other new lesions Neurological: Negative for worsening tremors and other numbness  Psychiatric/Behavioral: Negative for worsening agitation or other fatigue All other system neg  Per pt    Objective:   Physical Exam BP 126/84   Pulse 70   Temp 98.1 F (36.7 C) (Oral)   Ht 6' (1.829 m)   Wt 274 lb (124.3 kg)   SpO2 95%   BMI 37.16 kg/m  VS noted,  Constitutional: Pt appears in NAD HENT: Head: NCAT.  Right Ear: External ear normal.  Left Ear: External ear normal.  Eyes: . Pupils are equal, round, and reactive to light. Conjunctivae and EOM are normal Nose: without d/c or deformity Neck: Neck supple. Gross normal ROM Cardiovascular: Normal rate and regular rhythm.   Pulmonary/Chest: Effort normal and breath sounds without rales or wheezing.  Left knee:minor tender over medial joint line only Spine nontender throughout lower back Right knee with trace to 1+ effusion mostly medial aspect with tender to the patella but also moderate tender with swelling over the medial joint line,  assoc with reduced ROM Neurological: Pt is alert. At baseline orientation, motor grossly intact Skin: Skin is warm. No rashes, other new lesions, no LE edema Psychiatric: Pt behavior is normal without agitation  No other exam findings   XR Knee Bilateral Ap Lateral & Obliq6/19/2019 [facility] Result Impression  IMPRESSION: No acute or significant abnormality  Electronically Signed by: Lois Huxley  Result Narrative  HISTORY: Injury  EXAM: Bilateral knees  TECHNIQUE: 3 views of each knee  COMPARISON: None  FINDINGS: No evidence of fracture of either knee. No significant compartment narrowing and no evidence of joint effusion.  Other Result Information  Acute Interface, Incoming Rad Results - 06/08/2018  4:07 PM EDT HISTORY: Injury  EXAM: Bilateral knees  TECHNIQUE: 3 views of each knee  COMPARISON: None  FINDINGS:  No evidence of fracture of either knee. No significant compartment narrowing and no evidence of joint effusion.   IMPRESSION: No acute or significant abnormality  Electronically Signed by: Lois HuxleyJames Davis       Assessment & Plan:

## 2018-06-28 NOTE — Assessment & Plan Note (Signed)
With suspicion for medial meniscal tearing; to avoid any torque relating moveements, hold on PT suggested by [facility], ok to cont naproxyn prn, but d/c tramadol as no help at all; ok for tylenol #3 prn limited rx, and to make appt with sports medicine in this office for diagnosis and possible knee for ortho referral

## 2018-06-28 NOTE — Assessment & Plan Note (Signed)
Minor, ok to cont current tx

## 2018-06-28 NOTE — Patient Instructions (Addendum)
Please take all new medication as prescribed - the tylenol #3 as needed  You appear to have possible medial meniscal tear by exam today; please make appt with Sports medicine in this office asap to have the ultrasound diagnosis and discussion of any further evaluation and treatment  Please continue all other medications as before, and refills have been done if requested.  Please have the pharmacy call with any other refills you may need.  Please keep your appointments with your specialists as you may have planned

## 2018-06-28 NOTE — Assessment & Plan Note (Signed)
stable overall by history and exam, recent data reviewed with pt, and pt to continue medical treatment as before,  to f/u any worsening symptoms or concerns ble BP Readings from Last 3 Encounters:  06/28/18 126/84  04/09/18 (!) 146/98  04/20/17 140/88

## 2018-07-21 ENCOUNTER — Ambulatory Visit: Payer: Self-pay

## 2018-07-21 ENCOUNTER — Encounter: Payer: Self-pay | Admitting: Family Medicine

## 2018-07-21 ENCOUNTER — Ambulatory Visit: Payer: PRIVATE HEALTH INSURANCE | Admitting: Family Medicine

## 2018-07-21 VITALS — BP 90/54 | HR 82 | Ht 72.0 in | Wt 273.0 lb

## 2018-07-21 DIAGNOSIS — S83411A Sprain of medial collateral ligament of right knee, initial encounter: Secondary | ICD-10-CM

## 2018-07-21 DIAGNOSIS — G8929 Other chronic pain: Secondary | ICD-10-CM

## 2018-07-21 DIAGNOSIS — M25561 Pain in right knee: Principal | ICD-10-CM

## 2018-07-21 MED ORDER — DICLOFENAC SODIUM 2 % TD SOLN: 2.0000 g | Freq: Two times a day (BID) | TRANSDERMAL | 3 refills | Status: DC

## 2018-07-21 NOTE — Patient Instructions (Signed)
Nice to meet you  Ice 20 minutes 2 times daily. Usually after activity and before bed. Exercises 3 times a week.  pennsaid pinkie amount topically 2 times daily as needed.  Consider the brace daily for 2-4 weeks Avoid any twisting motions.  Xray downstairs today  See me again in 4 weeks

## 2018-07-21 NOTE — Assessment & Plan Note (Signed)
There are noted.  Discussed icing regimen and home exercise.  Patient given a hinged brace.  X-rays pending.  Follow-up again in 4 weeks.  Worsening symptoms consider the possibility of injection and formal physical therapy

## 2018-07-21 NOTE — Progress Notes (Signed)
Bradley Watson D.O. [facility] [address] [place], [place] [address] Phone: [phone] Subjective:    I'm seeing this patient by the request  of:  [name], [name], MD   CC: Right knee pain  VHQ:[id number]:Subjective  Bradley Watson is a 57 y.o. male coming in with complaint of right knee pain. He fell in [employer] [place]. He has not been able to full extend his knee. Does feel like he twisted knee and fell on antero-medial aspect. Is having pain in anterior aspect of knee. Is having a hard time placing his right leg into vehicle due to pressure in knee.  Patient states certain movements such as twisting can also cause severe pain.  Rates the severity pain is 8 out of 10 when it occurs.     Past Medical History:  Diagnosis Date  . Carotid stenosis    Pre-CABG Dopplers: RICA 40-59%;  Carotid US (05/2014):  R 1-39%; normal LICA  . Childhood asthma   . Cluster headaches 1985   Mid 80's  . Coronary artery disease    a.  LHC 10/27/12 demonstrated pLAD 70%, AV CFX occluded, pOM 80%, dCFX collateralized the LAD and RCA, RCA without obstructive lesions, EF 50% with inferobasal HK;  b.  Echo 10/30/12: EF 55-60%, grade 1 diastolic dysfunction, mild LAE, normal LV wall motion;  c. s/p CABG 10/31/12 with Dr. Dorris FetchHendrickson:  L-LAD, left radial-OM;   b.  ETT-Nuc (05/2014):  + ECG changes, no ischemia; low risk  . Diverticulitis 02-2012   CT scan   . Fatty liver 02-2012   Mild- CT scan   . GERD (gastroesophageal reflux disease)   . HLD (hyperlipidemia)   . Hx of cardiovascular stress test    ETT-Myovew (05/2014):  + ECG changes with down-sloping ST depression; images with apical thinning but no ischemia, EF 52%; Low Risk  . Iron deficiency anemia 1980's   Past Surgical History:  Procedure Laterality Date  . CARDIAC CATHETERIZATION  10/27/2012  . CORONARY ARTERY BYPASS GRAFT  10/31/2012   Procedure: CORONARY ARTERY BYPASS GRAFTING (CABG);  Surgeon: Loreli SlotSteven C Hendrickson, MD;  [facility];  Service: Open Heart Surgery;  Laterality: N/A;  . LEFT HEART CATHETERIZATION WITH CORONARY ANGIOGRAM N/A 10/27/2012   Procedure: LEFT HEART CATHETERIZATION WITH CORONARY ANGIOGRAM;  Surgeon: Herby Abrahamhomas D Stuckey, MD;  [facility];  Service: Cardiovascular;  Laterality: N/A;  . RADIAL ARTERY HARVEST  10/31/2012   Procedure: RADIAL ARTERY HARVEST;  Surgeon: Loreli SlotSteven C Hendrickson, MD;  [facility];  Service: Open Heart Surgery;  Laterality: Left;  . RADIAL KERATOTOMY  1985   bilaterally   Social History   Socioeconomic History  . Marital status: Married    Spouse name: Not on file  . Number of children: Not on file  . Years of education: Not on file  . Highest education level: Not on file  Occupational History  . Occupation: Corporate treasurerales consultant    Employer: [employer]    Comment: [employer]  Social Needs  . Financial resource strain: Not on file  . Food insecurity:    Worry: Not on file    Inability: Not on file  . Transportation needs:    Medical: Not on file    Non-medical: Not on file  Tobacco Use  . Smoking status: Former Smoker    Packs/day: 0.12    Years: 1.50    Pack years: 0.18  . Smokeless tobacco: Never Used  Substance  and Sexual Activity  . Alcohol use: Yes  . Drug use: No    Types: "Crack" cocaine  . Sexual activity: Not on file  Lifestyle  . Physical activity:    Days per week: Not on file    Minutes per session: Not on file  . Stress: Not on file  Relationships  . Social connections:    Talks on phone: Not on file    Gets together: Not on file    Attends religious service: Not on file    Active member of club or organization: Not on file    Attends meetings of clubs or organizations: Not on file    Relationship status: Not on file  Other Topics Concern  . Not on file  Social History Narrative  . Not on file   Allergies  Allergen Reactions  . Penicillins Anxiety and Other (See Comments)    "made me feel like gravity had  increased and it was pulling me down" (10/27/2012)  . Lisinopril Cough   Family History  Problem Relation Age of Onset  . Lung cancer Father   . Diabetes Mellitus II Paternal Grandmother      Past medical history, social, surgical and family history all reviewed in electronic medical record.  No pertanent information unless stated regarding to the chief complaint.   Review of Systems:Review of systems updated and as accurate as of 07/21/18  No headache, visual changes, nausea, vomiting, diarrhea, constipation, dizziness, abdominal pain, skin rash, fevers, chills, night sweats, weight loss, swollen lymph nodes, body aches, chest pain, shortness of breath, mood changes.  Positive muscle aches, joint swelling  Objective  Blood pressure (!) 90/54, pulse 82, height 6' (1.829 m), weight 273 lb (123.8 kg), SpO2 97 %. Systems examined below as of 07/21/18   General: No apparent distress alert and oriented x3 mood and affect normal, dressed appropriately.  HEENT: Pupils equal, extraocular movements intact  Respiratory: Patient's speak in full sentences and does not appear short of breath  Cardiovascular: No lower extremity edema, non tender, no erythema  Skin: Warm dry intact with no signs of infection or rash on extremities or on axial skeleton.  Abdomen: Soft nontender  obese Neuro: Cranial nerves II through XII are intact, neurovascularly intact in all extremities with 2+ DTRs and 2+ pulses.  Lymph: No lymphadenopathy of posterior or anterior cervical chain or axillae bilaterally.  Gait normal with good balance and coordination.  MSK:  Non tender with full range of motion and good stability and symmetric strength and tone of shoulders, elbows, wrist, hip, and ankles bilaterally.  Right Knee exam trace effusion.  Patient does like the last 5 degrees of flexion.  Patient severely tender over the medial joint line.  Mild laxity of the MCL.  ACL appears to be intact.  Mild positive McMurray  sign. Contralateral  unremarkable  MSK US performed of: This study was ordered, performed, and interpreted by Terrilee Files D.O.  Knee: All structures visualized. Mild arthritic changes.  Degenerative tearing of the posterior medial meniscus.  MCL though it appears to have a partial articular sided tear proximally. Patellar Tendon unremarkable on long and transverse views without effusion. No abnormality of prepatellar bursa.   IMPRESSION: Questionable meniscal tear partial tear of the MCL     Impression and Recommendations:     This case required medical decision making of moderate complexity.      Note: This dictation was prepared with Dragon dictation along with smaller phrase technology. Any transcriptional  errors that result from this process are unintentional.

## 2018-08-15 NOTE — Progress Notes (Signed)
[facility] [address] [place], [place] [address] Phone: [phone]     Subjective:   I Bradley Watson am serving as a Neurosurgeon for Dr. Antoine Primas.  I  CC: Right knee pain follow-up  [id number]  Bradley Watson is a 57 y.o. male coming in with complaint of right knee pain. States that his knee has it's moments where he still feels pain.  Patient was found to have an MCL tear previously.  Partial.  Has been wearing the brace on a more regular basis.  States that he is feeling better overall.  Would state 40 to 50% better.  Still has some pain with certain movements.     Past Medical History:  Diagnosis Date  . Carotid stenosis    Pre-CABG Dopplers: RICA 40-59%;  Carotid US (05/2014):  R 1-39%; normal LICA  . Childhood asthma   . Cluster headaches 1985   Mid 80's  . Coronary artery disease    a.  LHC 10/27/12 demonstrated pLAD 70%, AV CFX occluded, pOM 80%, dCFX collateralized the LAD and RCA, RCA without obstructive lesions, EF 50% with inferobasal HK;  b.  Echo 10/30/12: EF 55-60%, grade 1 diastolic dysfunction, mild LAE, normal LV wall motion;  c. s/p CABG 10/31/12 with Dr. Dorris Fetch:  L-LAD, left radial-OM;   b.  ETT-Nuc (05/2014):  + ECG changes, no ischemia; low risk  . Diverticulitis 02-2012   CT scan   . Fatty liver 02-2012   Mild- CT scan   . GERD (gastroesophageal reflux disease)   . HLD (hyperlipidemia)   . Hx of cardiovascular stress test    ETT-Myovew (05/2014):  + ECG changes with down-sloping ST depression; images with apical thinning but no ischemia, EF 52%; Low Risk  . Iron deficiency anemia 1980's   Past Surgical History:  Procedure Laterality Date  . CARDIAC CATHETERIZATION  10/27/2012  . CORONARY ARTERY BYPASS GRAFT  10/31/2012   Procedure: CORONARY ARTERY BYPASS GRAFTING (CABG);  Surgeon: Loreli Slot, MD;  Location: [facility] OR;  Service: Open Heart Surgery;  Laterality: N/A;  . LEFT HEART CATHETERIZATION WITH  CORONARY ANGIOGRAM N/A 10/27/2012   Procedure: LEFT HEART CATHETERIZATION WITH CORONARY ANGIOGRAM;  Surgeon: Herby Abraham, MD;  Location[facility] CATH LAB;  Service: Cardiovascular;  Laterality: N/A;  . RADIAL ARTERY HARVEST  10/31/2012   Procedure: RADIAL ARTERY HARVEST;  Surgeon: Loreli Slot, MD;  Location: [facility] OR;  Service: Open Heart Surgery;  Laterality: Left;  . RADIAL KERATOTOMY  1985   bilaterally   Social History   Socioeconomic History  . Marital status: Married    Spouse name: Not on file  . Number of children: Not on file  . Years of education: Not on file  . Highest education level: Not on file  Occupational History  . Occupation: Corporate treasurer: [employer]    Comment: [employer]  Social Needs  . Financial resource strain: Not on file  . Food insecurity:    Worry: Not on file    Inability: Not on file  . Transportation needs:    Medical: Not on file    Non-medical: Not on file  Tobacco Use  . Smoking status: Former Smoker    Packs/day: 0.12    Years: 1.50    Pack years: 0.18  . Smokeless tobacco: Never Used  Substance and Sexual Activity  . Alcohol use: Yes  . Drug use: No    Types: "  Crack" cocaine  . Sexual activity: Not on file  Lifestyle  . Physical activity:    Days per week: Not on file    Minutes per session: Not on file  . Stress: Not on file  Relationships  . Social connections:    Talks on phone: Not on file    Gets together: Not on file    Attends religious service: Not on file    Active member of club or organization: Not on file    Attends meetings of clubs or organizations: Not on file    Relationship status: Not on file  Other Topics Concern  . Not on file  Social History Narrative  . Not on file   Allergies  Allergen Reactions  . Penicillins Anxiety and Other (See Comments)    "made me feel like gravity had increased and it was pulling me down" (10/27/2012)  . Lisinopril Cough   Family History    Problem Relation Age of Onset  . Lung cancer Father   . Diabetes Mellitus II Paternal Grandmother        Current Outpatient Medications (Analgesics):  .  aspirin 81 MG EC tablet, Take 1 tablet (81 mg total) by mouth daily. Marland Kitchen  ibuprofen (ADVIL,MOTRIN) 200 MG tablet, Take 200 mg by mouth every 6 (six) hours as needed.   Current Outpatient Medications (Other):  Marland Kitchen  Diclofenac Sodium 2 % SOLN, Place 2 g onto the skin 2 (two) times daily. Marland Kitchen  omeprazole (PRILOSEC) 20 MG capsule, Take 20 mg by mouth daily.    Past medical history, social, surgical and family history all reviewed in electronic medical record.  No pertanent information unless stated regarding to the chief complaint.   Review of Systems:  No headache, visual changes, nausea, vomiting, diarrhea, constipation, dizziness, abdominal pain, skin rash, fevers, chills, night sweats, weight loss, swollen lymph nodes, body aches, joint swelling, muscle aches, chest pain, shortness of breath, mood changes.   Objective  Blood pressure (!) 158/90, pulse 73, height 6' (1.829 m), weight 278 lb (126.1 kg), SpO2 96 %. Systems examined below as of    General: No apparent distress alert and oriented x3 mood and affect normal, dressed appropriately.  Obese HEENT: Pupils equal, extraocular movements intact  Respiratory: Patient's speak in full sentences and does not appear short of breath  Cardiovascular: No lower extremity edema, non tender, no erythema  Skin: Warm dry intact with no signs of infection or rash on extremities or on axial skeleton.  Abdomen: Soft nontender  Neuro: Cranial nerves II through XII are intact, neurovascularly intact in all extremities with 2+ DTRs and 2+ pulses.  Lymph: No lymphadenopathy of posterior or anterior cervical chain or axillae bilaterally.  Gait mild antalgic favoring the right knee MSK:  Non tender with full range of motion and good stability and symmetric strength and tone of shoulders, elbows, wrist,  hip, and ankles bilaterally.  Knee: Right Normal to inspection with no erythema or effusion or obvious bony abnormalities. Tender to palpation medial joint line ROM full in flexion and extension and lower leg rotation. Very mild laxity of the MCL compared to the contralateral side but otherwise all ligaments intact Negative Mcmurray's, Apley's, and Thessalonian tests. Mild painful patellar compression. Patellar glide without crepitus. Patellar and quadriceps tendons unremarkable. Hamstring and quadriceps strength is normal. Contralateral knee unremarkable  Limited musculoskeletal ultrasound was performed and interpreted by Judi Saa  Limited ultrasound shows that patient does have some interval healing of the partial MCL  tear that was appreciated previously.  Scar tissue formation noted.  Mild increase in Doppler flow.  No new findings except does still have the underlying mild arthritis   Impression and Recommendations:     This case required medical decision making of moderate complexity.  The above documentation has been reviewed and is accurate and complete Judi SaaZachary M Abigayl Hor      Note: This dictation was prepared with Dragon dictation along with smaller phrase technology. Any transcriptional errors that result from this process are unintentional.

## 2018-08-16 ENCOUNTER — Ambulatory Visit: Payer: Self-pay

## 2018-08-16 ENCOUNTER — Encounter: Payer: Self-pay | Admitting: Family Medicine

## 2018-08-16 ENCOUNTER — Ambulatory Visit: Payer: PRIVATE HEALTH INSURANCE | Admitting: Family Medicine

## 2018-08-16 VITALS — BP 158/90 | HR 73 | Ht 72.0 in | Wt 278.0 lb

## 2018-08-16 DIAGNOSIS — M25561 Pain in right knee: Secondary | ICD-10-CM | POA: Diagnosis not present

## 2018-08-16 DIAGNOSIS — G8929 Other chronic pain: Secondary | ICD-10-CM | POA: Diagnosis not present

## 2018-08-16 DIAGNOSIS — S83411A Sprain of medial collateral ligament of right knee, initial encounter: Secondary | ICD-10-CM | POA: Diagnosis not present

## 2018-08-16 NOTE — Patient Instructions (Signed)
Good to see you  You are healing but it is slow Ice is your friend Wear the brace but starting in 3 weeks wear it less and less  Continue the exercises regularly.  See em again in6  Weeks to make sur eyou are almost completely healed.

## 2018-08-16 NOTE — Assessment & Plan Note (Signed)
Patient has not tear.  Discussed icing regimen.  Is making progress.  Has the topical anti-inflammatories.  Patient will increase activity as tolerated.  Follow-up again in 6 to 8 weeks

## 2018-11-10 ENCOUNTER — Encounter: Payer: Self-pay | Admitting: Family Medicine

## 2018-11-10 ENCOUNTER — Encounter (HOSPITAL_BASED_OUTPATIENT_CLINIC_OR_DEPARTMENT_OTHER): Payer: Self-pay

## 2018-11-10 ENCOUNTER — Other Ambulatory Visit: Payer: Self-pay

## 2018-11-10 ENCOUNTER — Ambulatory Visit: Payer: PRIVATE HEALTH INSURANCE | Admitting: Family Medicine

## 2018-11-10 ENCOUNTER — Emergency Department (HOSPITAL_BASED_OUTPATIENT_CLINIC_OR_DEPARTMENT_OTHER)
Admission: EM | Admit: 2018-11-10 | Discharge: 2018-11-10 | Discharge status: Against Medical Advice | Payer: PRIVATE HEALTH INSURANCE | Attending: Emergency Medicine | Admitting: Emergency Medicine

## 2018-11-10 VITALS — BP 150/88 | HR 66 | Temp 98.6°F | Ht 72.0 in | Wt 283.0 lb

## 2018-11-10 DIAGNOSIS — Z5321 Procedure and treatment not carried out due to patient leaving prior to being seen by health care provider: Secondary | ICD-10-CM | POA: Insufficient documentation

## 2018-11-10 DIAGNOSIS — M542 Cervicalgia: Secondary | ICD-10-CM | POA: Insufficient documentation

## 2018-11-10 DIAGNOSIS — J029 Acute pharyngitis, unspecified: Secondary | ICD-10-CM

## 2018-11-10 DIAGNOSIS — H66002 Acute suppurative otitis media without spontaneous rupture of ear drum, left ear: Secondary | ICD-10-CM

## 2018-11-10 DIAGNOSIS — R05 Cough: Secondary | ICD-10-CM | POA: Insufficient documentation

## 2018-11-10 LAB — POCT RAPID STREP A (OFFICE): Rapid Strep A Screen: NEGATIVE

## 2018-11-10 MED ORDER — DOXYCYCLINE HYCLATE 100 MG PO TABS
100.0000 mg | ORAL_TABLET | Freq: Two times a day (BID) | ORAL | 0 refills | Status: DC
Start: 2018-11-10 — End: 2019-10-24

## 2018-11-10 NOTE — Progress Notes (Signed)
Subjective:    Patient ID: Bradley Watson, male    DOB: 1961-12-21, 57 y.o.   MRN: [id number]  HPI  [name] is a 57 year old male who presents today with a sore throat and cough that has been present for 5 days. He states that he went to the ED today for evaluation but decided to leave because he did not want to wait to be seen. Cough is minimally productive and described as mainly dry. He states that the cough is not keeping him awake at night.  Associated rhinitis, left ear pressure, mild hoarseness, and chills have been present. He denies N/V/D, SOB, and fever.  He states that he has noticed swelling on the left side of his jaw/ approximately 2 days ago.  Denies pain, fever, difficulty swallowing, or SOB. He reports that this seems to be associated with eating but is not able to quantify if this always occurs with eating.  He has not seen a dentist in "years" He denies tooth pain today.  Treatment with Nyquil and Theraflu have provided limited benefit. History of HTN and GERD  He has not had an influenza vaccine this year. No recent antibiotic use. He reports recent sick contact exposure at work. He is not a smoker     Review of Systems  Constitutional: Positive for chills. Negative for fever.  HENT: Positive for sore throat.        Left ear pressure  Eyes: Negative.   Respiratory: Positive for cough. Negative for sputum production.        Dry no productive for 2 days.   Cardiovascular: Negative.   Gastrointestinal: Negative.   Genitourinary: Negative.   Musculoskeletal: Negative.   Skin: Negative.   Neurological: Negative.   Endo/Heme/Allergies: Negative.   Psychiatric/Behavioral: Negative.    Past Medical History:  Diagnosis Date  . Carotid stenosis    Pre-CABG Dopplers: RICA 40-59%;  Carotid US (05/2014):  R 1-39%; normal LICA  . Childhood asthma   . Cluster headaches 1985   Mid 80's  . Coronary artery disease    a.  LHC 10/27/12 demonstrated pLAD 70%, AV CFX  occluded, pOM 80%, dCFX collateralized the LAD and RCA, RCA without obstructive lesions, EF 50% with inferobasal HK;  b.  Echo 10/30/12: EF 55-60%, grade 1 diastolic dysfunction, mild LAE, normal LV wall motion;  c. s/p CABG 10/31/12 with Dr. Dorris Fetch:  L-LAD, left radial-OM;   b.  ETT-Nuc (05/2014):  + ECG changes, no ischemia; low risk  . Diverticulitis 02-2012   CT scan   . Fatty liver 02-2012   Mild- CT scan   . GERD (gastroesophageal reflux disease)   . HLD (hyperlipidemia)   . Hx of cardiovascular stress test    ETT-Myovew (05/2014):  + ECG changes with down-sloping ST depression; images with apical thinning but no ischemia, EF 52%; Low Risk  . Iron deficiency anemia 1980's     Social History   Socioeconomic History  . Marital status: Married    Spouse name: Not on file  . Number of children: Not on file  . Years of education: Not on file  . Highest education level: Not on file  Occupational History  . Occupation: Corporate treasurer: [employer]    Comment: Architect [employer]  Social Needs  . Financial resource strain: Not on file  . Food insecurity:    Worry: Not on file    Inability: Not on file  . Transportation needs:  Medical: Not on file    Non-medical: Not on file  Tobacco Use  . Smoking status: Former Smoker    Packs/day: 0.12    Years: 1.50    Pack years: 0.18  . Smokeless tobacco: Never Used  Substance and Sexual Activity  . Alcohol use: Yes    Comment: occ  . Drug use: No    Types: "Crack" cocaine  . Sexual activity: Not on file  Lifestyle  . Physical activity:    Days per week: Not on file    Minutes per session: Not on file  . Stress: Not on file  Relationships  . Social connections:    Talks on phone: Not on file    Gets together: Not on file    Attends religious service: Not on file    Active member of club or organization: Not on file    Attends meetings of clubs or organizations: Not on file    Relationship status: Not on  file  . Intimate partner violence:    Fear of current or ex partner: Not on file    Emotionally abused: Not on file    Physically abused: Not on file    Forced sexual activity: Not on file  Other Topics Concern  . Not on file  Social History Narrative  . Not on file    Past Surgical History:  Procedure Laterality Date  . CARDIAC CATHETERIZATION  10/27/2012  . CORONARY ARTERY BYPASS GRAFT  10/31/2012   Procedure: CORONARY ARTERY BYPASS GRAFTING (CABG);  Surgeon: Loreli SlotSteven C Hendrickson, MD;  [facility];  Service: Open Heart Surgery;  Laterality: N/A;  . LEFT HEART CATHETERIZATION WITH CORONARY ANGIOGRAM N/A 10/27/2012   Procedure: LEFT HEART CATHETERIZATION WITH CORONARY ANGIOGRAM;  Surgeon: Herby Abrahamhomas D Stuckey, MD;  [facility];  Service: Cardiovascular;  Laterality: N/A;  . RADIAL ARTERY HARVEST  10/31/2012   Procedure: RADIAL ARTERY HARVEST;  Surgeon: Loreli SlotSteven C Hendrickson, MD;  [facility];  Service: Open Heart Surgery;  Laterality: Left;  . RADIAL KERATOTOMY  1985   bilaterally    Family History  Problem Relation Age of Onset  . Lung cancer Father   . Diabetes Mellitus II Paternal Grandmother     Allergies  Allergen Reactions  . Penicillins Anxiety and Other (See Comments)    "made me feel like gravity had increased and it was pulling me down" (10/27/2012)  . Lisinopril Cough    Current Outpatient Medications on File Prior to Visit  Medication Sig Dispense Refill  . aspirin 81 MG EC tablet Take 1 tablet (81 mg total) by mouth daily. 1 tablet   . Diclofenac Sodium 2 % SOLN Place 2 g onto the skin 2 (two) times daily. 112 g 3  . ibuprofen (ADVIL,MOTRIN) 200 MG tablet Take 200 mg by mouth every 6 (six) hours as needed.    Marland Kitchen. omeprazole (PRILOSEC) 20 MG capsule Take 20 mg by mouth daily.     No current facility-administered medications on file prior to visit.     BP (!) 150/88   Pulse 66   Temp 98.6 F (37 C) (Oral)   Ht 6' (1.829 m)   Wt 283 lb (128.4 kg)    SpO2 97%   BMI 38.38 kg/m        Objective:   Physical Exam  Constitutional: He is oriented to person, place, and time. He appears well-developed and well-nourished.  HENT:  Right Ear: Tympanic membrane and ear canal normal.  Left  Ear: Tympanic membrane is erythematous and bulging.  Nose: Rhinorrhea present. Right sinus exhibits no maxillary sinus tenderness and no frontal sinus tenderness. Left sinus exhibits no maxillary sinus tenderness and no frontal sinus tenderness.  Pharynx is mildly erythematous. No tonsillar exudate or edema present  Eyes: Pupils are equal, round, and reactive to light. No scleral icterus.  Neck: Neck supple.  Cardiovascular: Normal rate and regular rhythm.  Pulmonary/Chest: Effort normal and breath sounds normal. He has no wheezes. He has no rales.  Lymphadenopathy:    He has cervical adenopathy.  Neurological: He is alert and oriented to person, place, and time.  Skin: Skin is warm and dry. No erythema.  Psychiatric: He has a normal mood and affect. His behavior is normal. Judgment and thought content normal.      Assessment & Plan:  1. Acute suppurative otitis media of left ear without spontaneous rupture of tympanic membrane, recurrence not specified Exam and history are consistent with AOM. PCN allergy is present. Discussed treatment options and opted to treat with doxycycline. Advised supportive measures and return precautions provided.   - doxycycline (VIBRA-TABS) 100 MG tablet; Take 1 tablet (100 mg total) by mouth 2 (two) times daily.  Dispense: 20 tablet; Refill: 0  2. Sore throat Rapid strep negative. Anterior cervical lymphadenopathy is present which appears to be source of swelling that patient is referring to in HPI. Treatment for AOM initiated today. Advised use of warm salt water gargles. Advised patient on supportive measures:  Get rest, drink plenty of fluids, and use tylenol or ibuprofen as needed for pain. Follow up if fever >101, if  symptoms worsen or if symptoms are not improved in 3 days. Patient verbalizes understanding.   - POCT rapid strep A  Advised patient to have further evaluation by a dentist with description of jaw swelling that occurs with eating. I was not able to appreciate this on exam today. No obvious caries present. Patient also reports eating a [employer] chicken sandwich just prior to appointment. He denies any pain in the area. He agreed to follow up for dental care.   Work note provided to patient today.  Finally, advised patient to seek immediate medical treatment if he develops swelling in this area, difficulty swallowing,or SOB. He voiced understanding and agreed with plan.  Roddie Mc, FNP-C

## 2018-11-10 NOTE — ED Triage Notes (Signed)
C/o flu like sx mainly cough, sore throat x 5 days-noticed swelling to left neck yesterday-NAD-steady gait

## 2018-11-10 NOTE — Patient Instructions (Signed)
Please take antibiotic as directed with food.  Please drink plenty of water so that your urine is pale yellow or clear. Also, get plenty of rest, use tylenol or ibuprofen as needed for discomfort and follow up if symptoms do not improve in 3 to 4 days, worsen, or you develop a fever >101.  Please see a dentist for evaluation also.   Otitis Media, Adult Otitis media is redness, soreness, and puffiness (swelling) in the space just behind your eardrum (middle ear). It may be caused by allergies or infection. It often happens along with a cold. Follow these instructions at home:  Take your medicine as told. Finish it even if you start to feel better.  Only take over-the-counter or prescription medicines for pain, discomfort, or fever as told by your doctor.  Follow up with your doctor as told. Contact a doctor if:  You have otitis media only in one ear, or bleeding from your nose, or both.  You notice a lump on your neck.  You are not getting better in 3-5 days.  You feel worse instead of better. Get help right away if:  You have pain that is not helped with medicine.  You have puffiness, redness, or pain around your ear.  You get a stiff neck.  You cannot move part of your face (paralysis).  You notice that the bone behind your ear hurts when you touch it. This information is not intended to replace advice given to you by your health care provider. Make sure you discuss any questions you have with your health care provider. Document Released: 05/25/2008 Document Revised: 05/14/2016 Document Reviewed: 07/04/2013 Elsevier Interactive Patient Education  2017 [employer]

## 2018-11-11 ENCOUNTER — Encounter: Payer: Self-pay | Admitting: Family

## 2018-11-11 ENCOUNTER — Other Ambulatory Visit: Payer: Self-pay | Admitting: Family

## 2018-11-11 ENCOUNTER — Telehealth: Payer: Self-pay | Admitting: Family Medicine

## 2018-11-11 NOTE — Telephone Encounter (Signed)
Please let him know that we will extend his work note for today;  Bradley Watson did not intend for him to be out of work until he was able to see a Education officer, communitydentist.   As far as dentists, he should talk to family/ friends for recommendation.

## 2018-11-11 NOTE — Telephone Encounter (Signed)
Copied from CRM [id number]. Topic: General - Other >> Nov 11, 2018  9:03 AM Elliot GaultBell, Tiffany M wrote: Relation to pt: self  Call back number: [phone]   Reason for call:  Patient was seen by Roddie McKordsmeier, Julia, FNP [facility] and as per AVS "Please see a dentist for evaluation". Patient states current dentist is no longer in practice and would like Amil AmenJulia to recommend a dentist. Patient requesting Dr. Note extension until patient is seen by a dentist, please upload Dr. Note to My Chart. Patient states please leave detail message regarding specialist information and Dr. Note.  Please advise?

## 2018-12-21 HISTORY — PX: OTHER SURGICAL HISTORY: SHX169

## 2019-02-10 ENCOUNTER — Emergency Department (HOSPITAL_COMMUNITY)
Admission: EM | Admit: 2019-02-10 | Discharge: 2019-02-10 | Disposition: A | Discharge status: Home / Self Care | Payer: PRIVATE HEALTH INSURANCE | Attending: Emergency Medicine | Admitting: Emergency Medicine

## 2019-02-10 ENCOUNTER — Other Ambulatory Visit: Payer: Self-pay

## 2019-02-10 ENCOUNTER — Encounter (HOSPITAL_COMMUNITY): Payer: Self-pay | Admitting: Emergency Medicine

## 2019-02-10 DIAGNOSIS — I10 Essential (primary) hypertension: Secondary | ICD-10-CM | POA: Diagnosis not present

## 2019-02-10 DIAGNOSIS — Z87891 Personal history of nicotine dependence: Secondary | ICD-10-CM | POA: Diagnosis not present

## 2019-02-10 DIAGNOSIS — M62830 Muscle spasm of back: Secondary | ICD-10-CM | POA: Insufficient documentation

## 2019-02-10 DIAGNOSIS — Z7982 Long term (current) use of aspirin: Secondary | ICD-10-CM | POA: Insufficient documentation

## 2019-02-10 DIAGNOSIS — Z951 Presence of aortocoronary bypass graft: Secondary | ICD-10-CM | POA: Insufficient documentation

## 2019-02-10 DIAGNOSIS — I2510 Atherosclerotic heart disease of native coronary artery without angina pectoris: Secondary | ICD-10-CM | POA: Diagnosis not present

## 2019-02-10 MED ORDER — CYCLOBENZAPRINE HCL 10 MG PO TABS: 10.0000 mg | ORAL_TABLET | Freq: Two times a day (BID) | ORAL | 0 refills | Status: DC | PRN

## 2019-02-10 MED ORDER — IBUPROFEN 800 MG PO TABS: 800.0000 mg | ORAL_TABLET | Freq: Three times a day (TID) | ORAL | 0 refills | Status: DC | PRN

## 2019-02-10 MED ORDER — KETOROLAC TROMETHAMINE 60 MG/2ML IM SOLN
60.0000 mg | Freq: Once | INTRAMUSCULAR | Status: AC
  Administered 2019-02-10: 60 mg via INTRAMUSCULAR
  Filled 2019-02-10: qty 2

## 2019-02-10 MED ORDER — LIDOCAINE 5 % EX PTCH
1.0000 | MEDICATED_PATCH | CUTANEOUS | Status: DC
  Administered 2019-02-10: 1 via TRANSDERMAL
  Filled 2019-02-10: qty 1

## 2019-02-10 MED ORDER — LIDOCAINE 5 % EX PTCH: 1.0000 | MEDICATED_PATCH | CUTANEOUS | 0 refills | Status: AC

## 2019-02-10 MED ORDER — LIDOCAINE 5 % EX PTCH: 1.0000 | MEDICATED_PATCH | CUTANEOUS | 0 refills | Status: DC

## 2019-02-10 NOTE — ED Triage Notes (Signed)
Patient c/o left back and flank pain worsening x2 weeks. Reports pain worsens with movement. Denies back injury. Hx chronic back pain. Denies urinary sx.

## 2019-02-10 NOTE — ED Provider Notes (Signed)
[facility]-EMERGENCY DEPT Provider Note   CSN: [id number] Arrival date & time: 02/10/19  1443    History   Chief Complaint Chief Complaint  Patient presents with  . Flank Pain  . Back Pain    HPI Bradley [facility] is a 58 y.o. male.     The history is provided by the patient.  Back Pain  Location:  Thoracic spine Quality:  Aching and stiffness Stiffness is present:  In the morning Radiates to:  Does not radiate Pain severity:  Moderate Onset quality:  Gradual Duration:  3 weeks Timing:  Intermittent Progression:  Waxing and waning Chronicity:  Recurrent Context comment:  Chronic back pain, increased spasms over the last few days. Patient states back gave out on him a few weeks ago, intermittently using motrin and used wifes muscles relaxant with relief but has run out. Worse today when he first got up but has improved Relieved by:  Muscle relaxants and NSAIDs Worsened by:  Palpation, sitting and movement Associated symptoms: no abdominal pain, no bladder incontinence, no bowel incontinence, no chest pain, no dysuria, no fever, no headaches, no leg pain, no numbness, no paresthesias, no pelvic pain, no perianal numbness, no tingling, no weakness and no weight loss     Past Medical History:  Diagnosis Date  . Carotid stenosis    Pre-CABG Dopplers: RICA 40-59%;  Carotid US (05/2014):  R 1-39%; normal LICA  . Childhood asthma   . Cluster headaches 1985   Mid 80's  . Coronary artery disease    a.  LHC 10/27/12 demonstrated pLAD 70%, AV CFX occluded, pOM 80%, dCFX collateralized the LAD and RCA, RCA without obstructive lesions, EF 50% with inferobasal HK;  b.  Echo 10/30/12: EF 55-60%, grade 1 diastolic dysfunction, mild LAE, normal LV wall motion;  c. s/p CABG 10/31/12 with Dr. Dorris Fetch:  L-LAD, left radial-OM;   b.  ETT-Nuc (05/2014):  + ECG changes, no ischemia; low risk  . Diverticulitis 02-2012   CT scan   . Fatty liver 02-2012   Mild- CT scan     . GERD (gastroesophageal reflux disease)   . HLD (hyperlipidemia)   . Hx of cardiovascular stress test    ETT-Myovew (05/2014):  + ECG changes with down-sloping ST depression; images with apical thinning but no ischemia, EF 52%; Low Risk  . Iron deficiency anemia 1980's    Patient Active Problem List   Diagnosis Date Noted  . Tear of MCL (medial collateral ligament) of knee, right, initial encounter 07/21/2018  . Right knee pain 06/28/2018  . Left knee pain 06/28/2018  . Fatigue 04/20/2017  . Cough 03/31/2017  . Prediabetes 09/29/2016  . Routine general medical examination at a health care facility 09/29/2016  . Eczema, allergic 09/29/2016  . Essential hypertension 05/14/2016  . Coronary atherosclerosis of native coronary artery 11/21/2012  . Hyperlipidemia LDL goal <70 10/28/2012  . GERD (gastroesophageal reflux disease) 10/24/2012    Past Surgical History:  Procedure Laterality Date  . CARDIAC CATHETERIZATION  10/27/2012  . CORONARY ARTERY BYPASS GRAFT  10/31/2012   Procedure: CORONARY ARTERY BYPASS GRAFTING (CABG);  Surgeon: Loreli Slot, MD;  Location: [facility] OR;  Service: Open Heart Surgery;  Laterality: N/A;  . LEFT HEART CATHETERIZATION WITH CORONARY ANGIOGRAM N/A 10/27/2012   Procedure: LEFT HEART CATHETERIZATION WITH CORONARY ANGIOGRAM;  Surgeon: Herby Abraham, MD;  Location: [facility] CATH LAB;  Service: Cardiovascular;  Laterality: N/A;  . RADIAL ARTERY HARVEST  10/31/2012   Procedure: RADIAL  ARTERY HARVEST;  Surgeon: Loreli Slot, MD;  Location: [facility] OR;  Service: Open Heart Surgery;  Laterality: Left;  . RADIAL KERATOTOMY  1985   bilaterally        Home Medications    Prior to Admission medications   Medication Sig Start Date End Date Taking? Authorizing Provider  aspirin 81 MG EC tablet Take 1 tablet (81 mg total) by mouth daily. 01/13/13  Yes Herby Abraham, MD  hydrOXYzine (ATARAX/VISTARIL) 10 MG tablet Take 30-60 mg by mouth daily as needed for  itching.  01/04/19  Yes [provider]  omeprazole (PRILOSEC) 20 MG capsule Take 20 mg by mouth daily as needed (heartburn).    Yes [provider]  cyclobenzaprine (FLEXERIL) 10 MG tablet Take 1 tablet (10 mg total) by mouth 2 (two) times daily as needed for up to 20 doses for muscle spasms. 02/10/19   Nyx Keady, DO  Diclofenac Sodium 2 % SOLN Place 2 g onto the skin 2 (two) times daily. Patient not taking: Reported on 02/10/2019 07/21/18   Judi Saa, DO  doxycycline (VIBRA-TABS) 100 MG tablet Take 1 tablet (100 mg total) by mouth 2 (two) times daily. Patient not taking: Reported on 02/10/2019 11/10/18   Roddie Mc, FNP  ibuprofen (ADVIL,MOTRIN) 800 MG tablet Take 1 tablet (800 mg total) by mouth every 8 (eight) hours as needed for up to 30 doses. 02/10/19   Ameet Sandy, DO  lidocaine (LIDODERM) 5 % Place 1 patch onto the skin daily for 30 doses. Remove & Discard patch within 12 hours or as directed by MD 02/10/19 03/12/19  Virgina Norfolk, DO    Family History Family History  Problem Relation Age of Onset  . Lung cancer Father   . Diabetes Mellitus II Paternal Grandmother     Social History Social History   Tobacco Use  . Smoking status: Former Smoker    Packs/day: 0.12    Years: 1.50    Pack years: 0.18  . Smokeless tobacco: Never Used  Substance Use Topics  . Alcohol use: Yes    Comment: occ  . Drug use: No    Types: "Crack" cocaine     Allergies   Penicillins and Lisinopril   Review of Systems Review of Systems  Constitutional: Negative for chills, fever and weight loss.  HENT: Negative for ear pain and sore throat.   Eyes: Negative for pain and visual disturbance.  Respiratory: Negative for cough and shortness of breath.   Cardiovascular: Negative for chest pain and palpitations.  Gastrointestinal: Negative for abdominal pain, bowel incontinence and vomiting.  Genitourinary: Negative for bladder incontinence, dysuria, hematuria and  pelvic pain.  Musculoskeletal: Positive for back pain. Negative for arthralgias.  Skin: Negative for color change and rash.  Neurological: Negative for tingling, seizures, syncope, weakness, numbness, headaches and paresthesias.  All other systems reviewed and are negative.    Physical Exam Updated Vital Signs  ED Triage Vitals  Enc Vitals Group     BP 02/10/19 1448 (!) 192/103     Pulse Rate 02/10/19 1448 66     Resp 02/10/19 1448 16     Temp 02/10/19 1448 (!) 97.5 F (36.4 C)     Temp Source 02/10/19 1448 Oral     SpO2 02/10/19 1448 96 %     Weight 02/10/19 1448 278 lb (126.1 kg)     Height 02/10/19 1448 6' (1.829 m)     Head Circumference --      Peak  Flow --      Pain Score 02/10/19 1454 7     Pain Loc --      Pain Edu? --      Excl. in GC? --     Physical Exam Vitals signs and nursing note reviewed.  Constitutional:      Appearance: He is well-developed.  HENT:     Head: Normocephalic and atraumatic.     Nose: Nose normal.     Mouth/Throat:     Mouth: Mucous membranes are moist.  Eyes:     Conjunctiva/sclera: Conjunctivae normal.     Pupils: Pupils are equal, round, and reactive to light.  Neck:     Musculoskeletal: Normal range of motion and neck supple. No muscular tenderness.  Cardiovascular:     Rate and Rhythm: Normal rate and regular rhythm.     Pulses: Normal pulses.     Heart sounds: Normal heart sounds. No murmur.  Pulmonary:     Effort: Pulmonary effort is normal. No respiratory distress.     Breath sounds: Normal breath sounds.  Abdominal:     General: There is no distension.     Palpations: Abdomen is soft.     Tenderness: There is no abdominal tenderness. There is no right CVA tenderness or left CVA tenderness.  Musculoskeletal: Normal range of motion.        General: Tenderness (TTP to paraspinal thoracic muscles on the right side, muscles in that area with increased tonicity) present. No swelling.  Skin:    General: Skin is warm and dry.       Capillary Refill: Capillary refill takes less than 2 seconds.  Neurological:     General: No focal deficit present.     Mental Status: He is alert and oriented to person, place, and time.     Cranial Nerves: No cranial nerve deficit.     Sensory: No sensory deficit.     Motor: No weakness.     Coordination: Coordination normal.     Gait: Gait normal.  Psychiatric:        Mood and Affect: Mood normal.      ED Treatments / Results  Labs (all labs ordered are listed, but only abnormal results are displayed) Labs Reviewed - No data to display  EKG None  Radiology No results found.  Procedures Procedures (including critical care time)  Medications Ordered in ED Medications  ketorolac (TORADOL) injection 60 mg (has no administration in time range)  lidocaine (LIDODERM) 5 % 1 patch (has no administration in time range)     Initial Impression / Assessment and Plan / ED Course  I have reviewed the triage vital signs and the nursing notes.  Pertinent labs & imaging results that were available during my care of the patient were reviewed by me and considered in my medical decision making (see chart for details).        Angelique HolmCharles E Bergerson is a 58 year old male with history of chronic back pain who presents to the ED with muscle stiffness in the left side of his mid back.  Patient with overall unremarkable vitals.  Initially hypertensive.  Patient states that he throughout his back several weeks ago.  Has had improvement with Motrin and his wife's Flexeril which she has run out of.  States that his symptoms get better as the day goes on.  Today the back locked up on him again.  He is tender over the paraspinal muscles of his mid thoracic spine on  the left side with increased tone in those muscles in that area.  Denies any pain with urination, no hematuria. No neurological symptoms.  No gait issues.  No loss of bowel or bladder, no history of physical concerning for cauda equina or  other cord process.  No abdominal pain, good pulses throughout.  Doubt dissection.  History and physical is consistent with a muscle spasm.  Patient given lidocaine, Toradol injection while in the ED.  Will give lidocaine patch prescription, Motrin, Flexeril.  Recommend follow-up with primary care doctor.  May benefit from physical therapy.  Educated about need for likely weight loss which would improve long term pain.  Given return precautions.  Discharged in good condition.  This chart was dictated using voice recognition software.  Despite best efforts to proofread,  errors can occur which can change the documentation meaning.    Final Clinical Impressions(s) / ED Diagnoses   Final diagnoses:  Back spasm    ED Discharge Orders         Ordered    lidocaine (LIDODERM) 5 %  Every 24 hours     02/10/19 1556    cyclobenzaprine (FLEXERIL) 10 MG tablet  2 times daily PRN     02/10/19 1556    ibuprofen (ADVIL,MOTRIN) 800 MG tablet  Every 8 hours PRN     02/10/19 1556           Shariece Viveiros, DO 02/10/19 1557

## 2019-05-31 ENCOUNTER — Encounter (INDEPENDENT_AMBULATORY_CARE_PROVIDER_SITE_OTHER): Payer: Self-pay | Admitting: Ophthalmology

## 2019-05-31 ENCOUNTER — Ambulatory Visit (INDEPENDENT_AMBULATORY_CARE_PROVIDER_SITE_OTHER): Payer: PRIVATE HEALTH INSURANCE | Admitting: Ophthalmology

## 2019-05-31 ENCOUNTER — Other Ambulatory Visit: Payer: Self-pay

## 2019-05-31 DIAGNOSIS — H3322 Serous retinal detachment, left eye: Secondary | ICD-10-CM

## 2019-05-31 DIAGNOSIS — H25813 Combined forms of age-related cataract, bilateral: Secondary | ICD-10-CM

## 2019-05-31 DIAGNOSIS — H52203 Unspecified astigmatism, bilateral: Secondary | ICD-10-CM

## 2019-05-31 DIAGNOSIS — H35413 Lattice degeneration of retina, bilateral: Secondary | ICD-10-CM | POA: Diagnosis not present

## 2019-05-31 DIAGNOSIS — Z9889 Other specified postprocedural states: Secondary | ICD-10-CM

## 2019-05-31 DIAGNOSIS — H40003 Preglaucoma, unspecified, bilateral: Secondary | ICD-10-CM

## 2019-05-31 DIAGNOSIS — H524 Presbyopia: Secondary | ICD-10-CM

## 2019-05-31 DIAGNOSIS — H33313 Horseshoe tear of retina without detachment, bilateral: Secondary | ICD-10-CM

## 2019-05-31 DIAGNOSIS — H5213 Myopia, bilateral: Secondary | ICD-10-CM

## 2019-05-31 DIAGNOSIS — H3581 Retinal edema: Secondary | ICD-10-CM | POA: Diagnosis not present

## 2019-05-31 MED ORDER — PREDNISOLONE ACETATE 1 % OP SUSP: 1.0000 [drp] | Freq: Four times a day (QID) | OPHTHALMIC | 0 refills | Status: AC

## 2019-05-31 NOTE — Progress Notes (Signed)
[facility] Note  05/31/2019     CHIEF COMPLAINT Patient presents for Retina Evaluation   HISTORY OF PRESENT ILLNESS: Bradley Watson is a 58 y.o. male who presents to the clinic today for:   HPI    Retina Evaluation    In left eye.  This started 2 days ago.  Duration of 2 days.  Associated Symptoms Flashes and Floaters.  Context:  distance vision, mid-range vision and near vision.  Treatments tried include no treatments.  I, the attending physician,  performed the HPI with the patient and updated documentation appropriately.          Comments    58 y/o male pt referred by Dr. Ellie Lunch for eval of ret tear OS.  Noticed sudden onset temporal flashes and floaters OS 2 days ago.  Saw Dr. Ellie Lunch this a.m.  Still noticing intermittent temporal flashes and floaters OS.  No change in [place] OU.  No problems reported OD.  Denies pain.  Pt is amblyopic OD.  No gtts.       Last edited by Bernarda Caffey, MD on 05/31/2019  2:58 PM. (History)    pt states he saw Dr. Ellie Lunch this morning bc he has been seeing flashes and floaters for the past 2 days in his left eye, he states [facility] told him he had a tear and might need laser sx, pt has a hx of RK sx, pt denies being diabetic or having high blood pressure  Referring physician: Luberta Mutter, MD [place], [place] [id number]  HISTORICAL INFORMATION:   Selected notes from the MEDICAL RECORD NUMBER Referred by Dr. Luberta Mutter for concern of retinal tear OS LEE: 06.10.20 (C. [facility]) [BCVA: OD: 20/40 OS: 20/25] Ocular Hx- PMH-glaucoma suspect, lattice OU, NS OU, myopia, h/o RK OU   CURRENT MEDICATIONS: Current Outpatient Medications (Ophthalmic Drugs)  Medication Sig  . prednisoLONE acetate (PRED FORTE) 1 % ophthalmic suspension Place 1 drop into the left eye 4 (four) times daily for 7 days.   No current facility-administered medications for this visit.  (Ophthalmic Drugs)   Current Outpatient  Medications (Other)  Medication Sig  . aspirin 81 MG EC tablet Take 1 tablet (81 mg total) by mouth daily.  . cyclobenzaprine (FLEXERIL) 10 MG tablet Take 1 tablet (10 mg total) by mouth 2 (two) times daily as needed for up to 20 doses for muscle spasms.  . Diclofenac Sodium 2 % SOLN Place 2 g onto the skin 2 (two) times daily.  Marland Kitchen doxycycline (VIBRA-TABS) 100 MG tablet Take 1 tablet (100 mg total) by mouth 2 (two) times daily.  . hydrOXYzine (ATARAX/VISTARIL) 10 MG tablet Take 30-60 mg by mouth daily as needed for itching.   Marland Kitchen ibuprofen (ADVIL,MOTRIN) 800 MG tablet Take 1 tablet (800 mg total) by mouth every 8 (eight) hours as needed for up to 30 doses.  Marland Kitchen omeprazole (PRILOSEC) 20 MG capsule Take 20 mg by mouth daily as needed (heartburn).   . triamcinolone ointment (KENALOG) 0.1 % APPLY ON THE SKIN TWICE A DAY AS NEEDED   No current facility-administered medications for this visit.  (Other)      REVIEW OF SYSTEMS: ROS    Positive for: Gastrointestinal, Cardiovascular, Eyes   Negative for: Constitutional, Neurological, Skin, Genitourinary, Musculoskeletal, HENT, Endocrine, Respiratory, Psychiatric, Allergic/Imm, Heme/Lymph   Last edited by Matthew Folks, COA on 05/31/2019  2:16 PM. (History)       ALLERGIES Allergies  Allergen Reactions  .  Penicillins Anxiety and Other (See Comments)    "Made me feel like gravity had increased and it was pulling me down" (10/27/2012) Did it involve swelling of the face/tongue/throat, SOB, or low BP? No Did it involve sudden or severe rash/hives, skin peeling, or any reaction on the inside of your mouth or nose? No Did you need to seek medical attention at a hospital or doctor's office? No When did it last happen?Over 10 years ago If all above answers are "NO", may proceed with cephalosporin use.  Marland Kitchen Lisinopril Cough    PAST MEDICAL HISTORY Past Medical History:  Diagnosis Date  . Carotid stenosis    Pre-CABG Dopplers: RICA 40-59%;  Carotid  US (05/2014):  R 7-03%; normal LICA  . Childhood asthma   . Cluster headaches 1985   Mid 80's  . Coronary artery disease    a.  LHC 10/27/12 demonstrated pLAD 70%, AV CFX occluded, pOM 80%, dCFX collateralized the LAD and RCA, RCA without obstructive lesions, EF 50% with inferobasal HK;  b.  Echo 10/30/12: EF 40-35%, grade 1 diastolic dysfunction, mild LAE, normal LV wall motion;  c. s/p CABG 10/31/12 with Dr. Roxan Hockey:  L-LAD, left radial-OM;   b.  ETT-Nuc (05/2014):  + ECG changes, no ischemia; low risk  . Diverticulitis 02-2012   CT scan   . Fatty liver 02-2012   Mild- CT scan   . GERD (gastroesophageal reflux disease)   . HLD (hyperlipidemia)   . Hx of cardiovascular stress test    ETT-Myovew (05/2014):  + ECG changes with down-sloping ST depression; images with apical thinning but no ischemia, EF 52%; Low Risk  . Iron deficiency anemia 1980's   Past Surgical History:  Procedure Laterality Date  . CARDIAC CATHETERIZATION  10/27/2012  . CORONARY ARTERY BYPASS GRAFT  10/31/2012   Procedure: CORONARY ARTERY BYPASS GRAFTING (CABG);  Surgeon: Melrose Nakayama, MD;  Location: [facility];  Service: Open Heart Surgery;  Laterality: N/A;  . LEFT HEART CATHETERIZATION WITH CORONARY ANGIOGRAM N/A 10/27/2012   Procedure: LEFT HEART CATHETERIZATION WITH CORONARY ANGIOGRAM;  Surgeon: Hillary Bow, MD;  Location[facility] CATH LAB;  Service: Cardiovascular;  Laterality: N/A;  . RADIAL ARTERY HARVEST  10/31/2012   Procedure: RADIAL ARTERY HARVEST;  Surgeon: Melrose Nakayama, MD;  Location: [facility];  Service: Open Heart Surgery;  Laterality: Left;  . RADIAL KERATOTOMY  1985   bilaterally    FAMILY HISTORY Family History  Problem Relation Age of Onset  . Lung cancer Father   . Diabetes Mellitus II Paternal Grandmother     SOCIAL HISTORY Social History   Tobacco Use  . Smoking status: Former Smoker    Packs/day: 0.12    Years: 1.50    Pack years: 0.18  . Smokeless tobacco: Never Used   Substance Use Topics  . Alcohol use: Yes    Comment: occ  . Drug use: No    Types: "Crack" cocaine         OPHTHALMIC EXAM:  Base Eye Exam    Visual Acuity (Snellen - Linear)      Right Left   Dist cc 20/150 - 20/25 -   Dist ph cc 20/40 NI   Correction:  Glasses       Tonometry (Tonopen, 2:19 PM)      Right Left   Pressure 20 20  Squeezing       Pupils      Dark Light Shape React APD   Right 5 5 Round  Minimal None   Left 5 5 Round Minimal None  Pharm dil OU       Visual Fields (Counting fingers)      Left Right    Full Full       Extraocular Movement      Right Left    Full, Ortho Full, Ortho       Neuro/Psych    Oriented x3:  Yes   Mood/Affect:  Normal       Dilation    Both eyes:  1.0% Mydriacyl, 2.5% Phenylephrine @ 2:20 PM        Slit Lamp and Fundus Exam    Slit Lamp Exam      Right Left   Lids/Lashes Dermatochalasis - upper lid Dermatochalasis - upper lid   Conjunctiva/Sclera Melanosis Melanosis, trace temporal Pinguecula   Cornea 8 RK scars, mild Arcus 8 RK scars, mild Arcus   Anterior Chamber Deep and quiet Deep and quiet   Iris Round and dilated Round and dilated   Lens 2+ Nuclear sclerosis, 2+ Cortical cataract 2+ Nuclear sclerosis, 2-3+ Cortical cataract   Vitreous Vitreous syneresis Vitreous syneresis, +pigment, Posterior vitreous detachment, vitreous condensations       Fundus Exam      Right Left   Disc Sharp rim, +cupping, trace pallor, Peripapillary atrophy Pink and Sharp   C/D Ratio 0.8 0.65   Macula Good foveal reflex, trace Epiretinal membrane, RPE mottling and clumping, No heme or edema hazy view, grossly flat, trace ERM, no heme or edema   Vessels Vascular attenuation Vascular attenuation, Copper wiring   Periphery Attached, pigmented lattice at 1200, 0100, pigmented paving stone at 0600-0730, pigmented CR scar at 1030 Retinal break at 1200, HST from 0100-0130 with cuff of +SRF and +lattice within flap, round holes at  0200 with +SRF, pigmented CR scar at 0200 just anterior to holes, pigmented paving stone inferiorly         Refraction    Wearing Rx      Sphere Cylinder Axis Add   Right -8.00 +1.50 168 +2.00   Left -5.50 +1.00 036 +2.00   Age:  69yr   Type:  PAL       Manifest Refraction      Sphere Cylinder Axis Dist VA   Right -8.00 +1.50 168 20/100-2   Left -5.25 +1.00 036 20/25          IMAGING AND PROCEDURES  Imaging and Procedures for @TODAY @  OCT, Retina - OU - Both Eyes       Right Eye Quality was good. Central Foveal Thickness: 246. Progression has no prior data. Findings include normal foveal contour, no SRF, no IRF, myopic contour (Focal VMA superior macula caught on widefield).   Left Eye Quality was good. Central Foveal Thickness: 269. Progression has no prior data. Findings include normal foveal contour, no IRF, no SRF, myopic contour (+vitreous opacities).   Notes *Images captured and stored on drive  Diagnosis / Impression:  NFP, no IRF/SRF OU Myopic contour OU Focal VMA superior macula caught on widefield OD +vitreous opacities OS   Clinical management:  See below  Abbreviations: NFP - Normal foveal profile. CME - cystoid macular edema. PED - pigment epithelial detachment. IRF - intraretinal fluid. SRF - subretinal fluid. EZ - ellipsoid zone. ERM - epiretinal membrane. ORA - outer retinal atrophy. ORT - outer retinal tubulation. SRHM - subretinal hyper-reflective material        Repair Retinal Detach, Photocoag - OS - Left Eye  LASER PROCEDURE NOTE  Procedure:  Barrier laser retinopexy using slit lamp laser, LEFT eye   Diagnosis:   Retinal tears with surrounding SRF / focal retinal detachment, LEFT eye                     Scattered lattice degeneration superiorly  Surgeon: Bernarda Caffey, MD, PhD  Anesthesia: Topical  Informed consent obtained, operative eye marked, and time out performed prior to initiation of laser.   Laser settings:   Lumenis Smart532 laser, slit lamp Lens: [place] PRP 165 Power: 230 mW Spot size: 200 microns Duration: 30 msec  # spots: 110  Placement of laser: Using a [place] PRP 165 contact lens at the slit lamp, laser was placed in three confluent rows around large HST w/ focal RD at 130, patches of lattice w/ retinal holes at 1200 and 0200 oclock anterior to equator. Laser indirect ophthalmoscopy was used to complete the anterior portions of the laser: 246 spots, 260 mW power, 70 ms duration.  Complications: None.  Patient tolerated the procedure well and received written and verbal post-procedure care information/education.                 ASSESSMENT/PLAN:    ICD-10-CM   1. Retinal tear of both eyes H33.313 Repair Retinal Detach, Photocoag - OS - Left Eye  2. Left retinal detachment H33.22 Repair Retinal Detach, Photocoag - OS - Left Eye  3. Lattice degeneration of both retinas H35.413 Repair Retinal Detach, Photocoag - OS - Left Eye  4. Retinal edema H35.81 OCT, Retina - OU - Both Eyes  5. Myopia of both eyes with astigmatism and presbyopia H52.13    H52.203    H52.4   6. History of radial keratotomy Z98.890   7. Combined forms of age-related cataract of both eyes H25.813   8. Glaucoma suspect of both eyes H40.003     1-4. Retinal tear with focal retinal detachment, left eye.          Lattice degeneration w/ atrophic holes OU  - The incidence, risk factors, and natural history of retinal tear and detachment was discussed with patient.    - Potential treatment options including laser retinopexy and cryotherapy discussed with patient.  - horseshoe tear located at 0130 with cuff of +SRF and +lattice within flap  - scattered patches of lattice degeneration OU   OD - 12, 1, 1030 oclock   OS - 12-2 oclock  - recommend laser retinopexy OU, OS first today, 06.10.20  - RBA of procedure discussed, questions answered  - informed consent obtained and signed  - see procedure note  -  start PF QID OS x7 days  - f/u in 1-2 wks  5,6. History of high myopia s/p 8-cut Radial Keratotomy OU - discussed increased risk of lattice degeneration, RT, RD in high myopes - stable - monitor  7. 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  - discussed diagnosis and progression  - not yet visually significant  - monitor for now  8. Glaucoma Suspect OU  - IOP 20 OU  - +cupping  - +family history of glaucoma  - management per Dr. Ellie Lunch   Ophthalmic Meds Ordered this visit:  Meds ordered this encounter  Medications  . prednisoLONE acetate (PRED FORTE) 1 % ophthalmic suspension    Sig: Place 1 drop into the left eye 4 (four) times daily for 7 days.  Dispense:  10 mL    Refill:  0       Return for f/u 2 weeks POV OS, laser retinopexy OD.  There are no Patient Instructions on file for this visit.   Explained the diagnoses, plan, and follow up with the patient and they expressed understanding.  Patient expressed understanding of the importance of proper follow up care.   This document serves as a record of services personally performed by Gardiner Sleeper, MD, PhD. It was created on their behalf by Ernest Mallick, OA, an ophthalmic assistant. The creation of this record is the provider's dictation and/or activities during the visit.    Electronically signed by: Ernest Mallick, OA  06.10.2020 11:00 PM    Gardiner Sleeper, M.D., Ph.D. Diseases & Surgery of the Retina and Vitreous [facility]  I have reviewed the above documentation for accuracy and completeness, and I agree with the above. Gardiner Sleeper, M.D., Ph.D. 05/31/19 11:00 PM   Abbreviations: M myopia (nearsighted); A astigmatism; H hyperopia (farsighted); P presbyopia; Mrx spectacle prescription;  CTL contact lenses; OD right eye; OS left eye; OU both eyes  XT exotropia; ET esotropia; PEK punctate  epithelial keratitis; PEE punctate epithelial erosions; DES dry eye syndrome; MGD meibomian gland dysfunction; ATs artificial tears; PFAT's preservative free artificial tears; [place] nuclear sclerotic cataract; PSC posterior subcapsular cataract; ERM epi-retinal membrane; PVD posterior vitreous detachment; RD retinal detachment; DM diabetes mellitus; DR diabetic retinopathy; NPDR non-proliferative diabetic retinopathy; PDR proliferative diabetic retinopathy; CSME clinically significant macular edema; DME diabetic macular edema; dbh dot blot hemorrhages; CWS cotton wool spot; POAG primary open angle glaucoma; C/D cup-to-disc ratio; HVF humphrey visual field; GVF goldmann visual field; OCT optical coherence tomography; IOP intraocular pressure; BRVO Branch retinal vein occlusion; CRVO central retinal vein occlusion; CRAO central retinal artery occlusion; BRAO branch retinal artery occlusion; RT retinal tear; SB scleral buckle; PPV pars plana vitrectomy; VH Vitreous hemorrhage; PRP panretinal laser photocoagulation; IVK intravitreal kenalog; VMT vitreomacular traction; MH Macular hole;  NVD neovascularization of the disc; NVE neovascularization elsewhere; AREDS age related eye disease study; ARMD age related macular degeneration; POAG primary open angle glaucoma; EBMD epithelial/anterior basement membrane dystrophy; ACIOL anterior chamber intraocular lens; IOL intraocular lens; PCIOL posterior chamber intraocular lens; Phaco/IOL phacoemulsification with intraocular lens placement; [place] photorefractive keratectomy; LASIK laser assisted in situ keratomileusis; HTN hypertension; DM diabetes mellitus; COPD chronic obstructive pulmonary disease

## 2019-06-09 ENCOUNTER — Encounter (INDEPENDENT_AMBULATORY_CARE_PROVIDER_SITE_OTHER): Payer: PRIVATE HEALTH INSURANCE | Admitting: Ophthalmology

## 2019-06-09 ENCOUNTER — Other Ambulatory Visit: Payer: Self-pay

## 2019-06-09 DIAGNOSIS — H33022 Retinal detachment with multiple breaks, left eye: Secondary | ICD-10-CM

## 2019-06-09 DIAGNOSIS — H43813 Vitreous degeneration, bilateral: Secondary | ICD-10-CM | POA: Diagnosis not present

## 2019-06-09 DIAGNOSIS — H4312 Vitreous hemorrhage, left eye: Secondary | ICD-10-CM | POA: Diagnosis not present

## 2019-06-14 NOTE — Progress Notes (Addendum)
[facility] - Clinic Note  06/16/2019     CHIEF COMPLAINT Patient presents for Retina Follow Up   HISTORY OF PRESENT ILLNESS: Bradley Watson is a 58 y.o. male who presents to the clinic today for:   HPI    Retina Follow Up    Patient presents with  Retinal Break/Detachment.  In both eyes.  Severity is moderate.  Duration of 2 weeks.  Since onset it is gradually worsening.  I, the attending physician,  performed the HPI with the patient and updated documentation appropriately.          Comments    Patient states new flashes OD for the past 4 days, only sees in dark bedroom at patient's home. Still has floaters OD, but they have improved. Can see blood floating in OS (brownish/black) floaters-no worse than before. On brimonidine tid OU, IOP's checked 2 days ago: OD 20 mmHg, OS 17 mmHg.       Last edited by Rennis ChrisZamora, Carolene Gitto, MD on 06/16/2019  5:13 PM. (History)    pt states he came in last week and saw Dr. Ashley Watson bc he had bleeding in his left eye, he states it has cleared up some, but he states he can still see blood in his eye, he states he has seen some flashes in his right eye, but they are intermittent and only happen when he is in his bedroom  Referring physician: Etta Watson, Bradley L, MD [address]LO[address] [place] [id number]  HISTORICAL INFORMATION:   Selected notes from the MEDICAL RECORD NUMBER Referred by Dr. Maris Bergerhristine Watson for concern of retinal tear OS LEE: 06.10.20 (C. Watson) [BCVA: OD: 20/40 OS: 20/25] Ocular Hx- PMH-glaucoma suspect, lattice OU, NS OU, myopia, h/o RK OU   CURRENT MEDICATIONS: Current Outpatient Medications (Ophthalmic Drugs)  Medication Sig  . brimonidine (ALPHAGAN) 0.2 % ophthalmic solution Place 1 drop into both eyes 3 (three) times daily.   No current facility-administered medications for this visit.  (Ophthalmic Drugs)   Current Outpatient Medications (Other)  Medication Sig  . aspirin 81 MG EC tablet  Take 1 tablet (81 mg total) by mouth daily.  . cyclobenzaprine (FLEXERIL) 10 MG tablet Take 1 tablet (10 mg total) by mouth 2 (two) times daily as needed for up to 20 doses for muscle spasms.  . Diclofenac Sodium 2 % SOLN Place 2 g onto the skin 2 (two) times daily.  Marland Kitchen. doxycycline (VIBRA-TABS) 100 MG tablet Take 1 tablet (100 mg total) by mouth 2 (two) times daily.  . hydrOXYzine (ATARAX/VISTARIL) 10 MG tablet Take 30-60 mg by mouth daily as needed for itching.   Marland Kitchen. ibuprofen (ADVIL,MOTRIN) 800 MG tablet Take 1 tablet (800 mg total) by mouth every 8 (eight) hours as needed for up to 30 doses.  Marland Kitchen. omeprazole (PRILOSEC) 20 MG capsule Take 20 mg by mouth daily as needed (heartburn).   . triamcinolone ointment (KENALOG) 0.1 % APPLY ON THE SKIN TWICE A DAY AS NEEDED   No current facility-administered medications for this visit.  (Other)      REVIEW OF SYSTEMS: ROS    Positive for: Gastrointestinal, Cardiovascular, Eyes   Negative for: Constitutional, Neurological, Skin, Genitourinary, Musculoskeletal, HENT, Endocrine, Respiratory, Psychiatric, Allergic/Imm, Heme/Lymph   Last edited by Bradley Watson, Bradley Watson on 06/16/2019  1:44 PM. (History)       ALLERGIES Allergies  Allergen Reactions  . Penicillins Anxiety and Other (See Comments)    "Made me feel like gravity  had increased and it was pulling me down" (10/27/2012) Did it involve swelling of the face/tongue/throat, SOB, or low BP? No Did it involve sudden or severe rash/hives, skin peeling, or any reaction on the inside of your mouth or nose? No Did you need to seek medical attention at a hospital or doctor's office? No When did it last happen?Over 10 years ago If all above answers are "NO", may proceed with cephalosporin use.  Marland Kitchen Lisinopril Cough    PAST MEDICAL HISTORY Past Medical History:  Diagnosis Date  . Carotid stenosis    Pre-CABG Dopplers: RICA 40-59%;  Carotid US (05/2014):  R 7-35%; normal LICA  . Childhood asthma   . Cluster  headaches 1985   Mid 80's  . Coronary artery disease    a.  LHC 10/27/12 demonstrated pLAD 70%, AV CFX occluded, pOM 80%, dCFX collateralized the LAD and RCA, RCA without obstructive lesions, EF 50% with inferobasal HK;  b.  Echo 10/30/12: EF 32-99%, grade 1 diastolic dysfunction, mild LAE, normal LV wall motion;  c. s/p CABG 10/31/12 with Dr. Roxan Hockey:  Watson-LAD, left radial-OM;   b.  ETT-Nuc (05/2014):  + ECG changes, no ischemia; low risk  . Diverticulitis 02-2012   CT scan   . Fatty liver 02-2012   Mild- CT scan   . GERD (gastroesophageal reflux disease)   . HLD (hyperlipidemia)   . Hx of cardiovascular stress test    ETT-Myovew (05/2014):  + ECG changes with down-sloping ST depression; images with apical thinning but no ischemia, EF 52%; Low Risk  . Iron deficiency anemia 1980's   Past Surgical History:  Procedure Laterality Date  . CARDIAC CATHETERIZATION  10/27/2012  . CORONARY ARTERY BYPASS GRAFT  10/31/2012   Procedure: CORONARY ARTERY BYPASS GRAFTING (CABG);  Surgeon: Melrose Nakayama, MD;  Location: [facility];  Service: Open Heart Surgery;  Laterality: N/A;  . LEFT HEART CATHETERIZATION WITH CORONARY ANGIOGRAM N/A 10/27/2012   Procedure: LEFT HEART CATHETERIZATION WITH CORONARY ANGIOGRAM;  Surgeon: Hillary Bow, MD;  Location: [facility] CATH LAB;  Service: Cardiovascular;  Laterality: N/A;  . RADIAL ARTERY HARVEST  10/31/2012   Procedure: RADIAL ARTERY HARVEST;  Surgeon: Melrose Nakayama, MD;  Location: [facility];  Service: Open Heart Surgery;  Laterality: Left;  . RADIAL KERATOTOMY  1985   bilaterally    FAMILY HISTORY Family History  Problem Relation Age of Onset  . Lung cancer Father   . Diabetes Mellitus II Paternal Grandmother     SOCIAL HISTORY Social History   Tobacco Use  . Smoking status: Former Smoker    Packs/day: 0.12    Years: 1.50    Pack years: 0.18  . Smokeless tobacco: Never Used  Substance Use Topics  . Alcohol use: Yes    Comment: occ  . Drug use:  No    Types: "Crack" cocaine         OPHTHALMIC EXAM:  Base Eye Exam    Visual Acuity (Snellen - Linear)      Right Left   Dist cc 20/60 -2 20/25 +2   Dist ph cc 20/40 NI   Correction: Glasses       Tonometry (Tonopen, 1:51 PM)      Right Left   Pressure 22 20       Pupils      Dark Light Shape React APD   Right 4 3 Round Slow None   Left 4 3 Round Slow None       Visual  Fields (Counting fingers)      Left Right    Full Full       Extraocular Movement      Right Left    Full, Ortho Full, Ortho       Neuro/Psych    Oriented x3: Yes   Mood/Affect: Normal       Dilation    Both eyes: 1.0% Mydriacyl, 2.5% Phenylephrine @ 1:51 PM        Slit Lamp and Fundus Exam    Slit Lamp Exam      Right Left   Lids/Lashes Dermatochalasis - upper lid Dermatochalasis - upper lid   Conjunctiva/Sclera Melanosis Melanosis, trace temporal Pinguecula   Cornea 8 RK scars, mild Arcus 8 RK scars, mild Arcus   Anterior Chamber Deep and quiet Deep and quiet   Iris Round and dilated Round and dilated   Lens 2+ Nuclear sclerosis, 2+ Cortical cataract 2+ Nuclear sclerosis, 2-3+ Cortical cataract   Vitreous Vitreous syneresis, Posterior vitreous detachment, vitreous condensations, mild VH settling inferiorly Vitreous syneresis, +pigment, Posterior vitreous detachment, vitreous condensations and hemorrhage centrally       Fundus Exam      Right Left   Disc Sharp rim, +cupping, trace pallor, Peripapillary atrophy Obscured by VH, but perfused   C/Watson Ratio 0.8 0.65   Macula Good foveal reflex, trace Epiretinal membrane, RPE mottling and clumping, No heme or edema hazy view, grossly flat, trace ERM, no heme or edema   Vessels Vascular attenuation Vascular attenuation, Copper wiring   Periphery Attached, pigmented lattice at 1200, 0100, HST with bridging vessel and heme from 0130-0200, pigmented paving stone at 0600-0730, pigmented CR scar and lattice at 1030 Retinal break at 1200, HST from  0100-0130 with cuff of +SRF and +lattice within flap, round holes at 0200 with +SRF, pigmented CR scar at 0200 just anterior to holes, pigmented paving stone inferiorly -- good laser surrounding all lesions        Refraction    Wearing Rx      Sphere Cylinder Axis Add   Right -8.00 +1.50 168 +2.00   Left -5.50 +1.00 036 +2.00   Type: PAL          IMAGING AND PROCEDURES  Imaging and Procedures for @TODAY @  OCT, Retina - OU - Both Eyes       Right Eye Quality was good. Central Foveal Thickness: 245. Progression has been stable. Findings include normal foveal contour, no SRF, no IRF, myopic contour.   Left Eye Quality was good. Central Foveal Thickness: 279. Progression has been stable. Findings include normal foveal contour, no IRF, no SRF, myopic contour (+vitreous opacities).   Notes *Images captured and stored on drive  Diagnosis / Impression:  NFP, no IRF/SRF OU Myopic contour OU +vitreous opacities OU   Clinical management:  See below  Abbreviations: NFP - Normal foveal profile. CME - cystoid macular edema. PED - pigment epithelial detachment. IRF - intraretinal fluid. SRF - subretinal fluid. EZ - ellipsoid zone. ERM - epiretinal membrane. ORA - outer retinal atrophy. ORT - outer retinal tubulation. SRHM - subretinal hyper-reflective material        Repair Retinal Breaks, Laser - OD - Right Eye       LASER PROCEDURE NOTE  Procedure:  Barrier laser retinopexy using slit lamp laser, RIGHT eye   Diagnosis:   Retinal tear, RIGHT eye  Lattice degeneration superior quadrant from 1130 - 0200                         Circumferential tear from 1230-0130 anterior to equator   Surgeon: Rennis Chris, MD, PhD  Anesthesia: Topical  Informed consent obtained, operative eye marked, and time out performed prior to initiation of laser.   Laser settings:  Lumenis Smart532 laser, slit lamp Lens: Mainster PRP 165 Power: 340 mW Spot size: 200  microns Duration: 50 msec  # spots: 711  Placement of laser: Using a Mainster PRP 165 contact lens at the slit lamp, laser was placed in 3-5+ confluent rows around the retinal tear and patches of lattice in superior quadrant. Indirect laser ophthalmoscopy w/ scleral depression was performed to complete the anterior laser surrounding the retinal tear and lattice: 360 spots, 270 mW power, 70 ms duration.  Complications: None.  Patient tolerated the procedure well and received written and verbal post-procedure care information/education.                  ASSESSMENT/PLAN:    ICD-10-CM   1. Retinal tear of both eyes  H33.313 Repair Retinal Breaks, Laser - OD - Right Eye  2. Left retinal detachment  H33.22   3. Lattice degeneration of both retinas  H35.413 Repair Retinal Breaks, Laser - OD - Right Eye  4. Retinal edema  H35.81 OCT, Retina - OU - Both Eyes  5. Myopia of both eyes with astigmatism and presbyopia  H52.13    H52.203    H52.4   6. History of radial keratotomy  Z98.890   7. Combined forms of age-related cataract of both eyes  H25.813   8. Glaucoma suspect of both eyes  H40.003     1-4. Retinal tear with focal retinal detachment OU          Lattice degeneration w/ atrophic holes OU  - OD: new circumferential retinal tear with bridging vessel and heme  - OS: horseshoe tear located at 0130 with cuff of +SRF and +lattice within flap  - scattered patches of lattice degeneration OU   OD - 12, 1, circumferential tear with bridging vessel and heme from 0130-0200, 1030 oclock   OS - 12-2 oclock; HST at 0100  - s/p laser retinopexy OS (06.10.20) and touch up laser with Dr. Ashley Royalty 6.19.2020 -- good laser surrounding lesions OS  - recommend laser retinopexy OD today, 06.26.20  - RBA of procedure discussed, questions answered  - informed consent obtained and signed  - see procedure note  - start PF QID OD x7 days  - f/u next week, Wednesday  5,6. History of high myopia s/p  8-cut Radial Keratotomy OU  - discussed increased risk of lattice degeneration, RT, RD in high myopes  - stable  - monitor  7. 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  - discussed diagnosis and progression  - approaching visual significance -- OD > OS  - under the expert management of Dr. Charlotte Sanes -- will be cleared for cataract surgery ~6 wks after laser retinopexy  8. Glaucoma Suspect OU  - IOP 22 OD, 20 OS  - +cupping  - +family history of glaucoma  - management per Dr. Charlotte Sanes   Ophthalmic Meds Ordered this visit:  No orders of the defined types were placed in this encounter.      Return in about  5 days (around 06/21/2019) for f/u HST OD, DFE, OCT, OPTOS colors.  There are no Patient Instructions on file for this visit.   Explained the diagnoses, plan, and follow up with the patient and they expressed understanding.  Patient expressed understanding of the importance of proper follow up care.   This document serves as a record of services personally performed by Karie ChimeraBrian G. Aarti Mankowski, MD, PhD. It was created on their behalf by Laurian BrimAmanda Brown, OA, an ophthalmic assistant. The creation of this record is the provider's dictation and/or activities during the visit.    Electronically signed by: Laurian BrimAmanda Brown, OA  06.24.2020 5:38 PM   Karie ChimeraBrian G. Adelis Docter, M.Watson., Ph.Watson. Diseases & Surgery of the Retina and Vitreous [facility]  I have reviewed the above documentation for accuracy and completeness, and I agree with the above. Karie ChimeraBrian G. Javen Hinderliter, M.Watson., Ph.Watson. 06/16/19 5:38 PM    Abbreviations: M myopia (nearsighted); A astigmatism; H hyperopia (farsighted); P presbyopia; Mrx spectacle prescription;  CTL contact lenses; OD right eye; OS left eye; OU both eyes  XT exotropia; ET esotropia; PEK punctate epithelial keratitis; PEE punctate epithelial erosions; DES dry eye syndrome; MGD  meibomian gland dysfunction; ATs artificial tears; PFAT's preservative free artificial tears; NSC nuclear sclerotic cataract; PSC posterior subcapsular cataract; ERM epi-retinal membrane; PVD posterior vitreous detachment; RD retinal detachment; DM diabetes mellitus; DR diabetic retinopathy; NPDR non-proliferative diabetic retinopathy; PDR proliferative diabetic retinopathy; CSME clinically significant macular edema; DME diabetic macular edema; dbh dot blot hemorrhages; CWS cotton wool spot; POAG primary open angle glaucoma; C/Watson cup-to-disc ratio; HVF humphrey visual field; GVF goldmann visual field; OCT optical coherence tomography; IOP intraocular pressure; BRVO Branch retinal vein occlusion; CRVO central retinal vein occlusion; CRAO central retinal artery occlusion; BRAO branch retinal artery occlusion; RT retinal tear; SB scleral buckle; PPV pars plana vitrectomy; VH Vitreous hemorrhage; PRP panretinal laser photocoagulation; IVK intravitreal kenalog; VMT vitreomacular traction; MH Macular hole;  NVD neovascularization of the disc; NVE neovascularization elsewhere; AREDS age related eye disease study; ARMD age related macular degeneration; POAG primary open angle glaucoma; EBMD epithelial/anterior basement membrane dystrophy; ACIOL anterior chamber intraocular lens; IOL intraocular lens; PCIOL posterior chamber intraocular lens; Phaco/IOL phacoemulsification with intraocular lens placement; PRK photorefractive keratectomy; LASIK laser assisted in situ keratomileusis; HTN hypertension; DM diabetes mellitus; COPD chronic obstructive pulmonary disease

## 2019-06-16 ENCOUNTER — Encounter (INDEPENDENT_AMBULATORY_CARE_PROVIDER_SITE_OTHER): Payer: Self-pay | Admitting: Ophthalmology

## 2019-06-16 ENCOUNTER — Other Ambulatory Visit: Payer: Self-pay

## 2019-06-16 ENCOUNTER — Ambulatory Visit (INDEPENDENT_AMBULATORY_CARE_PROVIDER_SITE_OTHER): Payer: PRIVATE HEALTH INSURANCE | Admitting: Ophthalmology

## 2019-06-16 DIAGNOSIS — Z9889 Other specified postprocedural states: Secondary | ICD-10-CM

## 2019-06-16 DIAGNOSIS — H33313 Horseshoe tear of retina without detachment, bilateral: Secondary | ICD-10-CM | POA: Diagnosis not present

## 2019-06-16 DIAGNOSIS — H3322 Serous retinal detachment, left eye: Secondary | ICD-10-CM | POA: Diagnosis not present

## 2019-06-16 DIAGNOSIS — H35413 Lattice degeneration of retina, bilateral: Secondary | ICD-10-CM

## 2019-06-16 DIAGNOSIS — H3581 Retinal edema: Secondary | ICD-10-CM | POA: Diagnosis not present

## 2019-06-16 DIAGNOSIS — H5213 Myopia, bilateral: Secondary | ICD-10-CM

## 2019-06-16 DIAGNOSIS — H52203 Unspecified astigmatism, bilateral: Secondary | ICD-10-CM

## 2019-06-16 DIAGNOSIS — H524 Presbyopia: Secondary | ICD-10-CM

## 2019-06-16 DIAGNOSIS — H25813 Combined forms of age-related cataract, bilateral: Secondary | ICD-10-CM

## 2019-06-16 DIAGNOSIS — H40003 Preglaucoma, unspecified, bilateral: Secondary | ICD-10-CM

## 2019-06-21 ENCOUNTER — Encounter (INDEPENDENT_AMBULATORY_CARE_PROVIDER_SITE_OTHER): Payer: Self-pay | Admitting: Ophthalmology

## 2019-06-21 ENCOUNTER — Other Ambulatory Visit: Payer: Self-pay

## 2019-06-21 ENCOUNTER — Ambulatory Visit (INDEPENDENT_AMBULATORY_CARE_PROVIDER_SITE_OTHER): Payer: PRIVATE HEALTH INSURANCE | Admitting: Ophthalmology

## 2019-06-21 DIAGNOSIS — H35413 Lattice degeneration of retina, bilateral: Secondary | ICD-10-CM

## 2019-06-21 DIAGNOSIS — H3322 Serous retinal detachment, left eye: Secondary | ICD-10-CM

## 2019-06-21 DIAGNOSIS — Z9889 Other specified postprocedural states: Secondary | ICD-10-CM

## 2019-06-21 DIAGNOSIS — H40003 Preglaucoma, unspecified, bilateral: Secondary | ICD-10-CM

## 2019-06-21 DIAGNOSIS — H524 Presbyopia: Secondary | ICD-10-CM

## 2019-06-21 DIAGNOSIS — H52203 Unspecified astigmatism, bilateral: Secondary | ICD-10-CM

## 2019-06-21 DIAGNOSIS — H33313 Horseshoe tear of retina without detachment, bilateral: Secondary | ICD-10-CM

## 2019-06-21 DIAGNOSIS — H25813 Combined forms of age-related cataract, bilateral: Secondary | ICD-10-CM

## 2019-06-21 DIAGNOSIS — H3581 Retinal edema: Secondary | ICD-10-CM | POA: Diagnosis not present

## 2019-06-21 DIAGNOSIS — H5213 Myopia, bilateral: Secondary | ICD-10-CM

## 2019-06-21 NOTE — Progress Notes (Signed)
[facility] - Clinic Note  06/21/2019     CHIEF COMPLAINT Patient presents for Post-op Follow-up   HISTORY OF PRESENT ILLNESS: Bradley Watson is a 58 y.o. male who presents to the clinic today for:   HPI    Post-op Follow-up    In both eyes.  Discomfort includes Negative for pain, itching, foreign body sensation, tearing, discharge, floaters and none.  Vision is stable.  I, the attending physician,  performed the HPI with the patient and updated documentation appropriately.          Comments    Patient states vision the same OU. No new floaters or flashes.       Last edited by Rennis ChrisZamora, Modean Mccullum, MD on 06/21/2019  4:26 PM. (History)    pt states he came in last week and saw Dr. Ashley RoyaltyMatthews bc he had bleeding in his left eye, he states it has cleared up some, but he states he can still see blood in his eye, he states he has seen some flashes in his right eye, but they are intermittent and only happen when he is in his bedroom  Referring physician: [name], [name], MD [address]LO[address] [place] [id number]  HISTORICAL INFORMATION:   Selected notes from the MEDICAL RECORD NUMBER Referred by Dr. Maris Bergerhristine McCuen for concern of retinal tear OS LEE: 06.10.20 (C. McCuen) [BCVA: OD: 20/40 OS: 20/25] Ocular Hx- PMH-glaucoma suspect, lattice OU, NS OU, myopia, h/o RK OU   CURRENT MEDICATIONS: Current Outpatient Medications (Ophthalmic Drugs)  Medication Sig  . brimonidine (ALPHAGAN) 0.2 % ophthalmic solution Place 1 drop into both eyes 3 (three) times daily.  . prednisoLONE acetate (PRED FORTE) 1 % ophthalmic suspension Place 1 drop into both eyes 4 (four) times daily.   No current facility-administered medications for this visit.  (Ophthalmic Drugs)   Current Outpatient Medications (Other)  Medication Sig  . aspirin 81 MG EC tablet Take 1 tablet (81 mg total) by mouth daily.  . cyclobenzaprine (FLEXERIL) 10 MG tablet Take 1 tablet (10 mg total) by  mouth 2 (two) times daily as needed for up to 20 doses for muscle spasms.  . Diclofenac Sodium 2 % SOLN Place 2 g onto the skin 2 (two) times daily.  Marland Kitchen. doxycycline (VIBRA-TABS) 100 MG tablet Take 1 tablet (100 mg total) by mouth 2 (two) times daily.  . hydrOXYzine (ATARAX/VISTARIL) 10 MG tablet Take 30-60 mg by mouth daily as needed for itching.   Marland Kitchen. ibuprofen (ADVIL,MOTRIN) 800 MG tablet Take 1 tablet (800 mg total) by mouth every 8 (eight) hours as needed for up to 30 doses.  Marland Kitchen. omeprazole (PRILOSEC) 20 MG capsule Take 20 mg by mouth daily as needed (heartburn).   . triamcinolone ointment (KENALOG) 0.1 % APPLY ON THE SKIN TWICE A DAY AS NEEDED   No current facility-administered medications for this visit.  (Other)      REVIEW OF SYSTEMS: ROS    Positive for: Gastrointestinal, Cardiovascular, Eyes   Negative for: Constitutional, Neurological, Skin, Genitourinary, Musculoskeletal, HENT, Endocrine, Respiratory, Psychiatric, Allergic/Imm, Heme/Lymph   Last edited by Annalee GentaBarber, Daryl D on 06/21/2019  2:47 PM. (History)       ALLERGIES Allergies  Allergen Reactions  . Penicillins Anxiety and Other (See Comments)    "Made me feel like gravity had increased and it was pulling me down" (10/27/2012) Did it involve swelling of the face/tongue/throat, SOB, or low BP? No Did it involve sudden or severe  rash/hives, skin peeling, or any reaction on the inside of your mouth or nose? No Did you need to seek medical attention at a hospital or doctor's office? No When did it last happen?Over 10 years ago If all above answers are "NO", may proceed with cephalosporin use.  Marland Kitchen. Lisinopril Cough    PAST MEDICAL HISTORY Past Medical History:  Diagnosis Date  . Carotid stenosis    Pre-CABG Dopplers: RICA 40-59%;  Carotid US (05/2014):  R 1-39%; normal LICA  . Childhood asthma   . Cluster headaches 1985   Mid 80's  . Coronary artery disease    a.  LHC 10/27/12 demonstrated pLAD 70%, AV CFX occluded, pOM  80%, dCFX collateralized the LAD and RCA, RCA without obstructive lesions, EF 50% with inferobasal HK;  b.  Echo 10/30/12: EF 55-60%, grade 1 diastolic dysfunction, mild LAE, normal LV wall motion;  c. s/p CABG 10/31/12 with Dr. Dorris FetchHendrickson:  L-LAD, left radial-OM;   b.  ETT-Nuc (05/2014):  + ECG changes, no ischemia; low risk  . Diverticulitis 02-2012   CT scan   . Fatty liver 02-2012   Mild- CT scan   . GERD (gastroesophageal reflux disease)   . HLD (hyperlipidemia)   . Hx of cardiovascular stress test    ETT-Myovew (05/2014):  + ECG changes with down-sloping ST depression; images with apical thinning but no ischemia, EF 52%; Low Risk  . Iron deficiency anemia 1980's   Past Surgical History:  Procedure Laterality Date  . CARDIAC CATHETERIZATION  10/27/2012  . CORONARY ARTERY BYPASS GRAFT  10/31/2012   Procedure: CORONARY ARTERY BYPASS GRAFTING (CABG);  Surgeon: Loreli SlotSteven C Hendrickson, MD;  [facility];  Service: Open Heart Surgery;  Laterality: N/A;  . LEFT HEART CATHETERIZATION WITH CORONARY ANGIOGRAM N/A 10/27/2012   Procedure: LEFT HEART CATHETERIZATION WITH CORONARY ANGIOGRAM;  Surgeon: Herby Abrahamhomas D Stuckey, MD;  [facility];  Service: Cardiovascular;  Laterality: N/A;  . RADIAL ARTERY HARVEST  10/31/2012   Procedure: RADIAL ARTERY HARVEST;  Surgeon: Loreli SlotSteven C Hendrickson, MD;  [facility];  Service: Open Heart Surgery;  Laterality: Left;  . RADIAL KERATOTOMY  1985   bilaterally    FAMILY HISTORY Family History  Problem Relation Age of Onset  . Lung cancer Father   . Diabetes Mellitus II Paternal Grandmother     SOCIAL HISTORY Social History   Tobacco Use  . Smoking status: Former Smoker    Packs/day: 0.12    Years: 1.50    Pack years: 0.18  . Smokeless tobacco: Never Used  Substance Use Topics  . Alcohol use: Yes    Comment: occ  . Drug use: No    Types: "Crack" cocaine         OPHTHALMIC EXAM:  Base Eye Exam    Visual Acuity (Snellen - Linear)       Right Left   Dist cc 20/70 20/30 +1   Dist ph cc 20/40 NI   Correction: Glasses       Tonometry (Tonopen, 2:46 PM)      Right Left   Pressure 15 12       Pupils      Dark Light Shape React APD   Right 4 3 Round Slow None   Left 4 3 Round Slow None       Visual Fields (Counting fingers)      Left Right    Full Full       Extraocular Movement  Right Left    Full, Ortho Full, Ortho       Neuro/Psych    Oriented x3: Yes   Mood/Affect: Normal       Dilation    Both eyes: 1.0% Mydriacyl, 2.5% Phenylephrine @ 2:46 PM        Slit Lamp and Fundus Exam    Slit Lamp Exam      Right Left   Lids/Lashes Dermatochalasis - mild MGD Dermatochalasis - upper lid   Conjunctiva/Sclera Melanosis Melanosis, trace temporal Pinguecula   Cornea 8 RK scars, mild Arcus 8 RK scars, mild Arcus   Anterior Chamber Deep and clear Deep and quiet   Iris Round and dilated Round and dilated   Lens 2+ Nuclear sclerosis, 2+ Cortical cataract 2+ Nuclear sclerosis, 2-3+ Cortical cataract   Vitreous Vitreous syneresis, Posterior vitreous detachment, vitreous condensations, mild VH settling inferiorly + RBC Vitreous syneresis, +pigment, Posterior vitreous detachment, vitreous condensations and hemorrhage centrally.  VH settling inferiorly.       Fundus Exam      Right Left   Disc Sharp rim, +cupping, mild pallor, Peripapillary atrophy Obscured by VH, but perfused.  Mild cupping.   C/D Ratio 0.8 0.65   Macula Blunted foveal reflex, trace Epiretinal membrane, RPE mottling and clumping, No heme or edema hazy view, grossly flat, trace ERM, no heme or edema   Vessels Vascular attenuation, mild tortuosity Vascular attenuation, Copper wiring   Periphery Attached, pigmented lattice at 1200, 0100, HST with bridging vessel and heme from 0130-0200, pigmented paving stone at 0600-0730, pigmented CR scar and lattice at 1030.  New small HST at 1045.  Good early laser changes around superior lesions.  VH settling  inferiorly. Retinal break at 1200, HST from 0100-0130 with cuff of +SRF and +lattice within flap, round holes at 0200 with +SRF, pigmented CR scar at 0200 just anterior to holes, pigmented paving stone inferiorly -- good laser surrounding all lesions.  VH settling inferiorly.        Refraction    Wearing Rx      Sphere Cylinder Axis Add   Right -8.00 +1.50 168 +2.00   Left -5.50 +1.00 036 +2.00   Type: PAL          IMAGING AND PROCEDURES  Imaging and Procedures for @TODAY @  OCT, Retina - OU - Both Eyes       Right Eye Quality was good. Central Foveal Thickness: 244. Progression has been stable. Findings include normal foveal contour, no SRF, no IRF, myopic contour.   Left Eye Quality was good. Central Foveal Thickness: 275. Progression has worsened. Findings include normal foveal contour, no IRF, no SRF, myopic contour (+vitreous opacities.  Interval increase in central vitreous opacities.).   Notes *Images captured and stored on drive  Diagnosis / Impression:  NFP, no IRF/SRF OU Myopic contour OU +vitreous opacities OU   Clinical management:  See below  Abbreviations: NFP - Normal foveal profile. CME - cystoid macular edema. PED - pigment epithelial detachment. IRF - intraretinal fluid. SRF - subretinal fluid. EZ - ellipsoid zone. ERM - epiretinal membrane. ORA - outer retinal atrophy. ORT - outer retinal tubulation. SRHM - subretinal hyper-reflective material        Repair Retinal Breaks, Laser - OD - Right Eye       LASER PROCEDURE NOTE  Procedure:  Barrier laser retinopexy using slit lamp laser, RIGHT eye   Diagnosis:   Retinal tear, RIGHT eye  New HST at 1045  Surgeon: Rennis Chris, MD, PhD  Anesthesia: Topical  Informed consent obtained, operative eye marked, and time out performed prior to initiation of laser.   Laser settings:  Lumenis Smart532 laser, slit lamp Lens: [place] PRP 165 Power: 340 mW Spot size: 200  microns Duration: 50 msec  # spots: 267  Placement of laser: Using a [place] PRP 165 contact lens at the slit lamp, laser was placed in 3+ confluent rows around the retinal tear and patch of lattice at 1045 and 1030, respectively. Indirect laser ophthalmoscopy w/ scleral depression was performed to complete the anterior laser surrounding the retinal tear and lattice: 102 spots, 300 mW power, 70 ms duration.  Complications: None.  Patient tolerated the procedure well and received written and verbal post-procedure care information/education.                  ASSESSMENT/PLAN:    ICD-10-CM   1. Retinal tear of both eyes  H33.313 Repair Retinal Breaks, Laser - OD - Right Eye  2. Left retinal detachment  H33.22   3. Lattice degeneration of both retinas  H35.413   4. Retinal edema  H35.81 OCT, Retina - OU - Both Eyes  5. Myopia of both eyes with astigmatism and presbyopia  H52.13    H52.203    H52.4   6. History of radial keratotomy  Z98.890   7. Combined forms of age-related cataract of both eyes  H25.813   8. Glaucoma suspect of both eyes  H40.003    Pt reports [facility] good OU.  No longer seeing "lightning bolts."  1-4. Retinal tear with focal retinal detachment OU          Lattice degeneration w/ atrophic holes OU  - OD: New small HST at 1045.  Laser today.  - OS: horseshoe tear located at 0130 with cuff of +SRF and +lattice within flap  - scattered patches of lattice degeneration OU   OD - 12, 1, circumferential tear with bridging vessel and heme from 0130-0200, 1030 oclock   OS - 12-2 oclock; HST at 0100   - s/p laser retinopexy OS (06.10.20) and touch up laser with Dr. Ashley Royalty 6.19.2020 -- good laser surrounding lesions OS  - s/p laser retinopexy OD on 6.26.2020 to large superonasal HST and superior patches of lattice  - recommend laser retinopexy OD today, 07.01.20, to new HST at 1045  - RBA of procedure discussed, questions answered  - informed consent obtained and  signed  - see procedure note  - start Lotemax SM QID OD x7 days -- sample bottle given  - f/u next week, Tuesday 7.7.2020  5,6. History of high myopia s/p 8-cut Radial Keratotomy OU  - discussed increased risk of lattice degeneration, RT, RD in high myopes  - stable  - monitor  7. 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  - discussed diagnosis and progression  - approaching visual significance -- OD > OS  - under the expert management of Dr. Charlotte Sanes -- will be cleared for cataract surgery ~6 wks after laser retinopexy  8. Glaucoma Suspect OU  - IOP 22 OD, 20 OS  - +cupping  - +family history of glaucoma  - management per Dr. Charlotte Sanes   Ophthalmic Meds Ordered this visit:  No orders of the defined types were placed in this encounter.      Return in about 1 week (around  06/28/2019) for S/p laser retinopexy OD, DFE, OCT.  There are no Patient Instructions on file for this visit.   Explained the diagnoses, plan, and follow up with the patient and they expressed understanding.  Patient expressed understanding of the importance of proper follow up care.    This document serves as a record of services personally performed by Gardiner Sleeper, MD, PhD. It was created on their behalf by Bradley Watson, OA, an ophthalmic assistant. The creation of this record is the provider's dictation and/or activities during the visit.    Electronically signed by: Bradley Watson, OA  07.01.2020 5:53 PM    Gardiner Sleeper, M.D., Ph.D. Diseases & Surgery of the Retina and Vitreous [facility]  I have reviewed the above documentation for accuracy and completeness, and I agree with the above. Gardiner Sleeper, M.D., Ph.D. 06/21/19 5:53 PM     Abbreviations: M myopia (nearsighted); A astigmatism; H hyperopia (farsighted); P presbyopia; Mrx spectacle prescription;  CTL contact lenses; OD  right eye; OS left eye; OU both eyes  XT exotropia; ET esotropia; PEK punctate epithelial keratitis; PEE punctate epithelial erosions; DES dry eye syndrome; MGD meibomian gland dysfunction; ATs artificial tears; PFAT's preservative free artificial tears; [place] nuclear sclerotic cataract; PSC posterior subcapsular cataract; ERM epi-retinal membrane; PVD posterior vitreous detachment; RD retinal detachment; DM diabetes mellitus; DR diabetic retinopathy; NPDR non-proliferative diabetic retinopathy; PDR proliferative diabetic retinopathy; CSME clinically significant macular edema; DME diabetic macular edema; dbh dot blot hemorrhages; CWS cotton wool spot; POAG primary open angle glaucoma; C/D cup-to-disc ratio; HVF humphrey visual field; GVF goldmann visual field; OCT optical coherence tomography; IOP intraocular pressure; BRVO Branch retinal vein occlusion; CRVO central retinal vein occlusion; CRAO central retinal artery occlusion; BRAO branch retinal artery occlusion; RT retinal tear; SB scleral buckle; PPV pars plana vitrectomy; VH Vitreous hemorrhage; PRP panretinal laser photocoagulation; IVK intravitreal kenalog; VMT vitreomacular traction; MH Macular hole;  NVD neovascularization of the disc; NVE neovascularization elsewhere; AREDS age related eye disease study; ARMD age related macular degeneration; POAG primary open angle glaucoma; EBMD epithelial/anterior basement membrane dystrophy; ACIOL anterior chamber intraocular lens; IOL intraocular lens; PCIOL posterior chamber intraocular lens; Phaco/IOL phacoemulsification with intraocular lens placement; [place] photorefractive keratectomy; LASIK laser assisted in situ keratomileusis; HTN hypertension; DM diabetes mellitus; COPD chronic obstructive pulmonary disease

## 2019-06-23 ENCOUNTER — Encounter (INDEPENDENT_AMBULATORY_CARE_PROVIDER_SITE_OTHER): Payer: PRIVATE HEALTH INSURANCE | Admitting: Ophthalmology

## 2019-06-23 ENCOUNTER — Other Ambulatory Visit (INDEPENDENT_AMBULATORY_CARE_PROVIDER_SITE_OTHER): Payer: Self-pay | Admitting: Ophthalmology

## 2019-06-23 MED ORDER — PREDNISOLONE ACETATE 1 % OP SUSP: 1.0000 [drp] | Freq: Four times a day (QID) | OPHTHALMIC | 0 refills | Status: DC

## 2019-06-26 NOTE — Progress Notes (Addendum)
[facility] - Clinic Note  06/27/2019     CHIEF COMPLAINT Patient presents for Post-op Follow-up   HISTORY OF PRESENT ILLNESS: Bradley Watson is a 58 y.o. male who presents to the clinic today for:   HPI    Post-op Follow-up    In both eyes.  Discomfort includes floaters.  Vision is stable.  I, the attending physician,  performed the HPI with the patient and updated documentation appropriately.          Comments    S/p Laser OD: 06/16/2019; 06/21/2019                  OS: 05/31/2019; 06/09/2019  Patient states he feels like he has vitreous jelly and blood in the way of his vision in both eyes but especially the right eye.  Patient denies eye pain or discomfort and denies any new or worsening floaters or fol OU.       Last edited by Bradley ChrisZamora, Bradley Piechota, MD on 06/27/2019 12:54 PM. (History)    pt states he feels like his right eye is blurrier since last visit, he states he has been doing his best to keep his head elevated, he denies flashing lights, pt states he is using brimonidine TID OU and pred QID OU. Pt reports being highly active on July 4th, which corresponds with onset of decreased vision.   Referring physician: [name], Bradley L, MD [address] FLO[address] [place] [id number]  HISTORICAL INFORMATION:   Selected notes from the MEDICAL RECORD NUMBER Referred by Dr. Maris Bergerhristine Watson for concern of retinal tear OS LEE: 06.10.20 (C. Watson) [BCVA: OD: 20/40 OS: 20/25] Ocular Hx- PMH-glaucoma suspect, lattice OU, NS OU, myopia, h/o RK OU   CURRENT MEDICATIONS: Current Outpatient Medications (Ophthalmic Drugs)  Medication Sig  . brimonidine (ALPHAGAN) 0.2 % ophthalmic solution Place 1 drop into both eyes 3 (three) times daily.  . prednisoLONE acetate (PRED FORTE) 1 % ophthalmic suspension Place 1 drop into the right eye 4 (four) times daily.   No current facility-administered medications for this visit.  (Ophthalmic Drugs)   Current Outpatient  Medications (Other)  Medication Sig  . aspirin 81 MG EC tablet Take 1 tablet (81 mg total) by mouth daily.  . cyclobenzaprine (FLEXERIL) 10 MG tablet Take 1 tablet (10 mg total) by mouth 2 (two) times daily as needed for up to 20 doses for muscle spasms.  . Diclofenac Sodium 2 % SOLN Place 2 g onto the skin 2 (two) times daily.  Marland Kitchen. doxycycline (VIBRA-TABS) 100 MG tablet Take 1 tablet (100 mg total) by mouth 2 (two) times daily.  . hydrOXYzine (ATARAX/VISTARIL) 10 MG tablet Take 30-60 mg by mouth daily as needed for itching.   Marland Kitchen. ibuprofen (ADVIL,MOTRIN) 800 MG tablet Take 1 tablet (800 mg total) by mouth every 8 (eight) hours as needed for up to 30 doses.  Marland Kitchen. omeprazole (PRILOSEC) 20 MG capsule Take 20 mg by mouth daily as needed (heartburn).   . triamcinolone ointment (KENALOG) 0.1 % APPLY ON THE SKIN TWICE A DAY AS NEEDED   No current facility-administered medications for this visit.  (Other)      REVIEW OF SYSTEMS: ROS    Positive for: Gastrointestinal, Cardiovascular, Eyes   Negative for: Constitutional, Neurological, Skin, Genitourinary, Musculoskeletal, HENT, Endocrine, Respiratory, Psychiatric, Allergic/Imm, Heme/Lymph   Last edited by Bradley Watson on 06/27/2019 10:15 AM. (History)       ALLERGIES Allergies  Allergen Reactions  .  Penicillins Anxiety and Other (See Comments)    "Made me feel like gravity had increased and it was pulling me down" (10/27/2012) Did it involve swelling of the face/tongue/throat, SOB, or low BP? No Did it involve sudden or severe rash/hives, skin peeling, or any reaction on the inside of your mouth or nose? No Did you need to seek medical attention at a hospital or doctor's office? No When did it last happen?Over 10 years ago If all above answers are "NO", may proceed with cephalosporin use.  Marland Kitchen Lisinopril Cough    PAST MEDICAL HISTORY Past Medical History:  Diagnosis Date  . Carotid stenosis    Pre-CABG Dopplers: RICA 40-59%;  Carotid US  (05/2014):  R 0-99%; normal LICA  . Childhood asthma   . Cluster headaches 1985   Mid 80's  . Coronary artery disease    a.  LHC 10/27/12 demonstrated pLAD 70%, AV CFX occluded, pOM 80%, dCFX collateralized the LAD and RCA, RCA without obstructive lesions, EF 50% with inferobasal HK;  b.  Echo 10/30/12: EF 83-38%, grade 1 diastolic dysfunction, mild LAE, normal LV wall motion;  c. s/p CABG 10/31/12 with Dr. Roxan Hockey:  Watson-LAD, left radial-OM;   b.  ETT-Nuc (05/2014):  + ECG changes, no ischemia; low risk  . Diverticulitis 02-2012   CT scan   . Fatty liver 02-2012   Mild- CT scan   . GERD (gastroesophageal reflux disease)   . HLD (hyperlipidemia)   . Hx of cardiovascular stress test    ETT-Myovew (05/2014):  + ECG changes with down-sloping ST depression; images with apical thinning but no ischemia, EF 52%; Low Risk  . Iron deficiency anemia 1980's   Past Surgical History:  Procedure Laterality Date  . CARDIAC CATHETERIZATION  10/27/2012  . CORONARY ARTERY BYPASS GRAFT  10/31/2012   Procedure: CORONARY ARTERY BYPASS GRAFTING (CABG);  Surgeon: Melrose Nakayama, MD;  Location: [facility];  Service: Open Heart Surgery;  Laterality: N/A;  . LEFT HEART CATHETERIZATION WITH CORONARY ANGIOGRAM N/A 10/27/2012   Procedure: LEFT HEART CATHETERIZATION WITH CORONARY ANGIOGRAM;  Surgeon: Hillary Bow, MD;  Location: [facility] CATH LAB;  Service: Cardiovascular;  Laterality: N/A;  . RADIAL ARTERY HARVEST  10/31/2012   Procedure: RADIAL ARTERY HARVEST;  Surgeon: Melrose Nakayama, MD;  Location: [facility];  Service: Open Heart Surgery;  Laterality: Left;  . RADIAL KERATOTOMY  1985   bilaterally    FAMILY HISTORY Family History  Problem Relation Age of Onset  . Lung cancer Father   . Diabetes Mellitus II Paternal Grandmother     SOCIAL HISTORY Social History   Tobacco Use  . Smoking status: Former Smoker    Packs/day: 0.12    Years: 1.50    Pack years: 0.18  . Smokeless tobacco: Never Used   Substance Use Topics  . Alcohol use: Yes    Comment: occ  . Drug use: No    Types: "Crack" cocaine         OPHTHALMIC EXAM:  Base Eye Exam    Visual Acuity (Snellen - Linear)      Right Left   Dist cc 20/150 -2 20/25 -1   Dist ph cc 20/60 -1    Correction: Glasses       Tonometry (Tonopen, 10:12 AM)      Right Left   Pressure 25 26  squeezing       Pupils      Dark Light Shape React APD   Right 3 2 Round  Brisk 0   Left 3 2 Round Brisk 0       Extraocular Movement      Right Left    Full Full       Neuro/Psych    Oriented x3: Yes   Mood/Affect: Normal       Dilation    Both eyes: 1.0% Mydriacyl, 2.5% Phenylephrine @ 10:13 AM        Slit Lamp and Fundus Exam    Slit Lamp Exam      Right Left   Lids/Lashes Dermatochalasis - mild MGD Dermatochalasis - upper lid   Conjunctiva/Sclera Melanosis Melanosis, trace temporal Pinguecula   Cornea 8 RK scars, mild Arcus 8 RK scars, mild Arcus   Anterior Chamber Deep and clear Deep and quiet   Iris Round and dilated Round and dilated   Lens 2+ Nuclear sclerosis, 2+ Cortical cataract 2+ Nuclear sclerosis, 2-3+ Cortical cataract   Vitreous Vitreous syneresis, Posterior vitreous detachment, vitreous condensations, diffuse VH slightly increased + RBC Vitreous syneresis, +pigment, Posterior vitreous detachment, blood stained vitreous condensations centrally, mild residual blot clots settling inferiorly       Fundus Exam      Right Left   Disc Sharp rim, +cupping, mild pallor, Peripapillary atrophy Obscured by VH, but perfused.  Mild cupping.   C/D Ratio 0.8 0.65   Macula Very hazy view, flat, Blunted foveal reflex, trace Epiretinal membrane, RPE mottling and clumping, No heme or edema hazy view, grossly flat, trace ERM, no heme or edema   Vessels Vascular attenuation, mild tortuosity Vascular attenuation, Copper wiring   Periphery Hazy view grossly Attached, pigmented lattice at 1200, 0100, HST with bridging vessel and  heme from 0130-0200, pigmented paving stone from 0600-0730, pigmented CR scar and lattice at 1030, small HST at 1045, Good laser changes around superior lesions, mild increase in diffuse VH Retinal break at 1200, HST from 0100-0130 with cuff of +SRF and +lattice within flap, round holes at 0200 with +SRF, pigmented CR scar at 0200 just anterior to holes, pigmented paving stone inferiorly -- good laser surrounding all lesions, VH settling inferiorly        Refraction    Wearing Rx      Sphere Cylinder Axis Add   Right -8.00 +1.50 168 +2.00   Left -5.50 +1.00 036 +2.00   Type: PAL          IMAGING AND PROCEDURES  Imaging and Procedures for @TODAY @  OCT, Retina - OU - Both Eyes       Right Eye Quality was good. Central Foveal Thickness: 244. Progression has worsened. Findings include normal foveal contour, no SRF, no IRF, myopic contour (Interval increase in vitreous opacities).   Left Eye Quality was good. Central Foveal Thickness: 276. Progression has worsened. Findings include normal foveal contour, no IRF, no SRF, myopic contour (Persistent vitreous opacities).   Notes *Images captured and stored on drive  Diagnosis / Impression:  NFP, no IRF/SRF OU Myopic contour OU Persistent vitreous opacities OU -- slight inc in vit opacities OD   Clinical management:  See below  Abbreviations: NFP - Normal foveal profile. CME - cystoid macular edema. PED - pigment epithelial detachment. IRF - intraretinal fluid. SRF - subretinal fluid. EZ - ellipsoid zone. ERM - epiretinal membrane. ORA - outer retinal atrophy. ORT - outer retinal tubulation. SRHM - subretinal hyper-reflective material                 ASSESSMENT/PLAN:    ICD-10-CM  1. Retinal tear of both eyes  H33.313   2. Bilateral retinal detachment  H33.23   3. Lattice degeneration of both retinas  H35.413   4. Vitreous hemorrhage of both eyes (HCC)  H43.13   5. Retinal edema  H35.81 OCT, Retina - OU - Both Eyes   6. 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 History of radial keratotomy  Z98.890   8. Combined forms of age-related cataract of both eyes  H25.813   9. Glaucoma suspect of both eyes  H40.003      1-5. Retinal tears with focal retinal detachments and vitreous hemorrhage OU          Lattice degeneration w/ atrophic holes OU  - s/p laser retinopexy OD (6.26.20) to large superonasal HST and superior patches of lattice, fill-in (07.01.20) to HST at 1045  - s/p laser retinopexy OS (06.10.20) and touch up laser with Dr. Ashley Royalty 6.19.2020 -- good laser surrounding lesions OS (HST at 0130 with cuff of +SRF and +lattice within flap, scattered patches of lattice degeneration)  - both eyes with diffuse vitreous hemorrhages 2/2 RTs w/ bridging vessels  - today, interval increase in VH due to increased activities on the 4th of July holiday  - pt does report some mild interval improvement following the 4th with decreasdd  - discussed importance of VH precautions  -- minimize activities, keep head elevated, avoid ASA/NSAIDs/blood thinners as able  - f/u Friday, July 10  6,7. History of high myopia s/p 8-cut Radial Keratotomy OU  - discussed increased risk of lattice degeneration, RT, RD in high myopes  - stable  - monitor  8. 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  - discussed diagnosis and progression  - approaching visual significance -- OD > OS  - under the expert management of Dr. Charlotte Sanes -- will be cleared for cataract surgery ~6 wks after laser retinopexy and after clearance of vitreous hemorrhages  9. Glaucoma Suspect OU  - IOP 25 OD, 26 OS -- squeezing  - +cupping  - +family history of glaucoma  - expert management per Dr. Charlotte Sanes -- brimonidine TID OU   Ophthalmic Meds Ordered this visit:  No orders of the defined types were placed in this  encounter.      Return for Friday, July 10 overbook okay.  There are no Patient Instructions on file for this visit.   Explained the diagnoses, plan, and follow up with the patient and they expressed understanding.  Patient expressed understanding of the importance of proper follow up care.   This document serves as a record of services personally performed by Karie Chimera, MD, PhD. It was created on their behalf by Laurian Brim, OA, an ophthalmic assistant. The creation of this record is the provider's dictation and/or activities during the visit.    Electronically signed by: Laurian Brim, OA  07.06.2020 1:09 PM     Karie Chimera, M.D., Ph.D. Diseases & Surgery of the Retina and Vitreous [facility]   I have reviewed the above documentation for accuracy and completeness, and I agree with the above. Karie Chimera, M.D., Ph.D. 06/27/19 1:09 PM     Abbreviations: M myopia (nearsighted); A astigmatism; H hyperopia (farsighted); P presbyopia; Mrx spectacle prescription;  CTL contact lenses; OD right eye; OS left eye; OU  both eyes  XT exotropia; ET esotropia; PEK punctate epithelial keratitis; PEE punctate epithelial erosions; DES dry eye syndrome; MGD meibomian gland dysfunction; ATs artificial tears; PFAT's preservative free artificial tears; NSC nuclear sclerotic cataract; PSC posterior subcapsular cataract; ERM epi-retinal membrane; PVD posterior vitreous detachment; RD retinal detachment; DM diabetes mellitus; DR diabetic retinopathy; NPDR non-proliferative diabetic retinopathy; PDR proliferative diabetic retinopathy; CSME clinically significant macular edema; DME diabetic macular edema; dbh dot blot hemorrhages; CWS cotton wool spot; POAG primary open angle glaucoma; C/D cup-to-disc ratio; HVF humphrey visual field; GVF goldmann visual field; OCT optical coherence tomography; IOP intraocular pressure; BRVO Branch retinal vein occlusion; CRVO central retinal  vein occlusion; CRAO central retinal artery occlusion; BRAO branch retinal artery occlusion; RT retinal tear; SB scleral buckle; PPV pars plana vitrectomy; VH Vitreous hemorrhage; PRP panretinal laser photocoagulation; IVK intravitreal kenalog; VMT vitreomacular traction; MH Macular hole;  NVD neovascularization of the disc; NVE neovascularization elsewhere; AREDS age related eye disease study; ARMD age related macular degeneration; POAG primary open angle glaucoma; EBMD epithelial/anterior basement membrane dystrophy; ACIOL anterior chamber intraocular lens; IOL intraocular lens; PCIOL posterior chamber intraocular lens; Phaco/IOL phacoemulsification with intraocular lens placement; PRK photorefractive keratectomy; LASIK laser assisted in situ keratomileusis; HTN hypertension; DM diabetes mellitus; COPD chronic obstructive pulmonary disease

## 2019-06-27 ENCOUNTER — Encounter (INDEPENDENT_AMBULATORY_CARE_PROVIDER_SITE_OTHER): Payer: Self-pay | Admitting: Ophthalmology

## 2019-06-27 ENCOUNTER — Ambulatory Visit (INDEPENDENT_AMBULATORY_CARE_PROVIDER_SITE_OTHER): Payer: PRIVATE HEALTH INSURANCE | Admitting: Ophthalmology

## 2019-06-27 ENCOUNTER — Other Ambulatory Visit: Payer: Self-pay

## 2019-06-27 DIAGNOSIS — Z9889 Other specified postprocedural states: Secondary | ICD-10-CM

## 2019-06-27 DIAGNOSIS — H3581 Retinal edema: Secondary | ICD-10-CM

## 2019-06-27 DIAGNOSIS — H25813 Combined forms of age-related cataract, bilateral: Secondary | ICD-10-CM

## 2019-06-27 DIAGNOSIS — H52203 Unspecified astigmatism, bilateral: Secondary | ICD-10-CM

## 2019-06-27 DIAGNOSIS — H33313 Horseshoe tear of retina without detachment, bilateral: Secondary | ICD-10-CM

## 2019-06-27 DIAGNOSIS — H524 Presbyopia: Secondary | ICD-10-CM

## 2019-06-27 DIAGNOSIS — H3323 Serous retinal detachment, bilateral: Secondary | ICD-10-CM

## 2019-06-27 DIAGNOSIS — H40003 Preglaucoma, unspecified, bilateral: Secondary | ICD-10-CM

## 2019-06-27 DIAGNOSIS — H5213 Myopia, bilateral: Secondary | ICD-10-CM

## 2019-06-27 DIAGNOSIS — H4313 Vitreous hemorrhage, bilateral: Secondary | ICD-10-CM

## 2019-06-27 DIAGNOSIS — H35413 Lattice degeneration of retina, bilateral: Secondary | ICD-10-CM

## 2019-06-29 NOTE — Progress Notes (Signed)
[facility] - Clinic Note  06/30/2019     CHIEF COMPLAINT Patient presents for Retina Follow Up   HISTORY OF PRESENT ILLNESS: Bradley Watson is a 58 y.o. male who presents to the clinic today for:   HPI    Retina Follow Up    Patient presents with  Other.  In left eye.  This started 1 month ago.  Severity is moderate.  Duration of 3 days.  Since onset it is gradually improving.  I, the attending physician,  performed the HPI with the patient and updated documentation appropriately.          Comments    58 y/o male pt here for 3 day f/u for VH OS.  VA OU seems a little better.  Denies pain, flashes, new floaters, but eyes seem a little irritated and sensitive to light today.  Pred QID OD.  Brimonidine TID OU.       Last edited by Rennis Chris, MD on 06/30/2019 12:22 PM. (History)       Referring physician: [name], MD [address] FLO[address] [place],  [place] [address]  HISTORICAL INFORMATION:   Selected notes from the MEDICAL RECORD NUMBER Referred by Dr. Maris Berger for concern of retinal tear OS LEE: 06.10.20 (C. McCuen) [BCVA: OD: 20/40 OS: 20/25] Ocular Hx- PMH-glaucoma suspect, lattice OU, NS OU, myopia, h/o RK OU   CURRENT MEDICATIONS: Current Outpatient Medications (Ophthalmic Drugs)  Medication Sig  . brimonidine (ALPHAGAN) 0.2 % ophthalmic solution Place 1 drop into both eyes 3 (three) times daily.  . prednisoLONE acetate (PRED FORTE) 1 % ophthalmic suspension Place 1 drop into the right eye 4 (four) times daily.   No current facility-administered medications for this visit.  (Ophthalmic Drugs)   Current Outpatient Medications (Other)  Medication Sig  . aspirin 81 MG EC tablet Take 1 tablet (81 mg total) by mouth daily.  . cyclobenzaprine (FLEXERIL) 10 MG tablet Take 1 tablet (10 mg total) by mouth 2 (two) times daily as needed for up to 20 doses for muscle spasms.  . Diclofenac Sodium 2 % SOLN Place 2 g onto the skin 2  (two) times daily.  Marland Kitchen doxycycline (VIBRA-TABS) 100 MG tablet Take 1 tablet (100 mg total) by mouth 2 (two) times daily.  . hydrOXYzine (ATARAX/VISTARIL) 10 MG tablet Take 30-60 mg by mouth daily as needed for itching.   Marland Kitchen ibuprofen (ADVIL,MOTRIN) 800 MG tablet Take 1 tablet (800 mg total) by mouth every 8 (eight) hours as needed for up to 30 doses.  Marland Kitchen omeprazole (PRILOSEC) 20 MG capsule Take 20 mg by mouth daily as needed (heartburn).   . tacrolimus (PROTOPIC) 0.1 % ointment APP EXT TO THE SKIN BID  . triamcinolone ointment (KENALOG) 0.1 % APPLY ON THE SKIN TWICE A DAY AS NEEDED   No current facility-administered medications for this visit.  (Other)      REVIEW OF SYSTEMS: ROS    Positive for: Gastrointestinal, Eyes   Negative for: Constitutional, Neurological, Skin, Genitourinary, Musculoskeletal, HENT, Endocrine, Cardiovascular, Respiratory, Psychiatric, Allergic/Imm, Heme/Lymph   Last edited by Celine Mans, COA on 06/30/2019 10:17 AM. (History)       ALLERGIES Allergies  Allergen Reactions  . Penicillins Anxiety and Other (See Comments)    "Made me feel like gravity had increased and it was pulling me down" (10/27/2012) Did it involve swelling of the face/tongue/throat, SOB, or low BP? No Did it involve sudden or severe rash/hives, skin  peeling, or any reaction on the inside of your mouth or nose? No Did you need to seek medical attention at a hospital or doctor's office? No When did it last happen?Over 10 years ago If all above answers are "NO", may proceed with cephalosporin use.  Marland Kitchen. Lisinopril Cough    PAST MEDICAL HISTORY Past Medical History:  Diagnosis Date  . Carotid stenosis    Pre-CABG Dopplers: RICA 40-59%;  Carotid US (05/2014):  R 1-39%; normal LICA  . Cataract    OU  . Childhood asthma   . Cluster headaches 1985   Mid 80's  . Coronary artery disease    a.  LHC 10/27/12 demonstrated pLAD 70%, AV CFX occluded, pOM 80%, dCFX collateralized the LAD and RCA,  RCA without obstructive lesions, EF 50% with inferobasal HK;  b.  Echo 10/30/12: EF 55-60%, grade 1 diastolic dysfunction, mild LAE, normal LV wall motion;  c. s/p CABG 10/31/12 with Dr. Dorris FetchHendrickson:  L-LAD, left radial-OM;   b.  ETT-Nuc (05/2014):  + ECG changes, no ischemia; low risk  . Diverticulitis 02-2012   CT scan   . Fatty liver 02-2012   Mild- CT scan   . GERD (gastroesophageal reflux disease)   . HLD (hyperlipidemia)   . Hx of cardiovascular stress test    ETT-Myovew (05/2014):  + ECG changes with down-sloping ST depression; images with apical thinning but no ischemia, EF 52%; Low Risk  . Iron deficiency anemia 1980's   Past Surgical History:  Procedure Laterality Date  . CARDIAC CATHETERIZATION  10/27/2012  . CORONARY ARTERY BYPASS GRAFT  10/31/2012   Procedure: CORONARY ARTERY BYPASS GRAFTING (CABG);  Surgeon: Loreli SlotSteven C Hendrickson, MD;  Location: [facility] OR;  Service: Open Heart Surgery;  Laterality: N/A;  . LEFT HEART CATHETERIZATION WITH CORONARY ANGIOGRAM N/A 10/27/2012   Procedure: LEFT HEART CATHETERIZATION WITH CORONARY ANGIOGRAM;  Surgeon: Herby Abrahamhomas D Stuckey, MD;  [facility];  Service: Cardiovascular;  Laterality: N/A;  . RADIAL ARTERY HARVEST  10/31/2012   Procedure: RADIAL ARTERY HARVEST;  Surgeon: Loreli SlotSteven C Hendrickson, MD;  [facility];  Service: Open Heart Surgery;  Laterality: Left;  . RADIAL KERATOTOMY  1985   bilaterally    FAMILY HISTORY Family History  Problem Relation Age of Onset  . Lung cancer Father   . Diabetes Mellitus II Paternal Grandmother     SOCIAL HISTORY Social History   Tobacco Use  . Smoking status: Former Smoker    Packs/day: 0.12    Years: 1.50    Pack years: 0.18  . Smokeless tobacco: Never Used  Substance Use Topics  . Alcohol use: Yes    Comment: occ  . Drug use: No    Types: "Crack" cocaine         OPHTHALMIC EXAM:  Base Eye Exam    Visual Acuity (Snellen - Linear)      Right Left   Dist cc 20/70 -2 20/20 -2    Dist ph cc 20/50 -    Correction: Glasses       Tonometry (Tonopen, 10:21 AM)      Right Left   Pressure 15 14       Pupils      Dark Light Shape React APD   Right 3 2 Round Brisk None   Left 3 2 Round Brisk None       Visual Fields (Counting fingers)      Left Right    Full Full  Extraocular Movement      Right Left    Full, Ortho Full, Ortho       Neuro/Psych    Oriented x3: Yes   Mood/Affect: Normal       Dilation    Both eyes: 1.0% Mydriacyl, 2.5% Phenylephrine @ 10:21 AM        Slit Lamp and Fundus Exam    Slit Lamp Exam      Right Left   Lids/Lashes Dermatochalasis - mild MGD Dermatochalasis - upper lid   Conjunctiva/Sclera Melanosis Melanosis, trace temporal Pinguecula   Cornea 8 RK scars, mild Arcus 8 RK scars, mild Arcus   Anterior Chamber Deep and clear Deep and quiet   Iris Round and dilated Round and dilated   Lens 2+ Nuclear sclerosis, 2+ Cortical cataract 2+ Nuclear sclerosis, 2-3+ Cortical cataract   Vitreous Vitreous syneresis, Posterior vitreous detachment, vitreous condensations, diffuse VH slightly increased + RBC Vitreous syneresis, +pigment, Posterior vitreous detachment, blood stained vitreous condensations centrally, mild residual blot clots settling inferiorly       Fundus Exam      Right Left   Disc Sharp rim, +cupping, mild pallor, Peripapillary atrophy Obscured by VH, but perfused.  Mild cupping.   C/D Ratio 0.8 0.65   Macula Very hazy view, flat, Blunted foveal reflex, trace Epiretinal membrane, RPE mottling and clumping, No heme or edema hazy view, grossly flat, trace ERM, no heme or edema   Vessels Vascular attenuation, mild tortuosity Vascular attenuation, Copper wiring   Periphery Hazy view temporally; Attached, pigmented lattice at 1200, 0100, HST with bridging vessel and heme from 0130-0200, pigmented paving stone from 0600-0730, pigmented CR scar and lattice at 1030 - light laser changes, small HST at 1045, Good laser  changes around superior lesions, mild increase in diffuse VH Retinal break at 1200, HST from 0100-0130 with cuff of +SRF and +lattice within flap, round holes at 0200 with +SRF, pigmented CR scar at 0200 just anterior to holes, pigmented paving stone inferiorly -- good laser surrounding all lesions, VH settling inferiorly          IMAGING AND PROCEDURES  Imaging and Procedures for @TODAY @  OCT, Retina - OU - Both Eyes       Right Eye Quality was good. Central Foveal Thickness: 258. Progression has been stable. Findings include normal foveal contour, no SRF, no IRF, myopic contour (Persistent vitreous opacities - mild interval improvement ).   Left Eye Quality was good. Central Foveal Thickness: 277. Progression has been stable. Findings include normal foveal contour, no IRF, no SRF, myopic contour (Persistent vitreous opacities - clearing and settling inferiorly).   Notes *Images captured and stored on drive  Diagnosis / Impression:  NFP, no IRF/SRF OU Myopic contour OU Persistent vitreous opacities - mild interval improvement    Clinical management:  See below  Abbreviations: NFP - Normal foveal profile. CME - cystoid macular edema. PED - pigment epithelial detachment. IRF - intraretinal fluid. SRF - subretinal fluid. EZ - ellipsoid zone. ERM - epiretinal membrane. ORA - outer retinal atrophy. ORT - outer retinal tubulation. SRHM - subretinal hyper-reflective material                 ASSESSMENT/PLAN:    ICD-10-CM   1. Retinal tear of both eyes  H33.313   2. Bilateral retinal detachment  H33.23   3. Lattice degeneration of both retinas  H35.413   4. Vitreous hemorrhage of both eyes ([place]ierks)  H43.13   5. Retinal edema  H35.81 OCT, Retina - OU - Both Eyes  6. 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 History of radial keratotomy  Z98.890   8. Combined forms of age-related cataract of both eyes  H25.813   9. Glaucoma suspect  of both eyes  H40.003   10. Left retinal detachment  H33.22     1-5. Retinal tears with focal retinal detachments and vitreous hemorrhage OU          Lattice degeneration w/ atrophic holes OU  - s/p laser retinopexy OD (6.26.20) to large superonasal HST and superior patches of lattice, fill-in (07.01.20) to HST at 1045  - s/p laser retinopexy OS (06.10.20) and touch up laser with Dr. Ashley RoyaltyMatthews 6.19.2020 -- good laser surrounding lesions OS (HST at 0130 with cuff of +SRF and +lattice within flap, scattered patches of lattice degeneration)  - both eyes with diffuse vitreous hemorrhages 2/2 RTs w/ bridging vessels  - discussed importance of VH precautions  -- minimize activities, keep head elevated, avoid ASA/NSAIDs/blood thinners as able  - f/u Friday, July 10  6,7. History of high myopia s/p 8-cut Radial Keratotomy OU  - discussed increased risk of lattice degeneration, RT, RD in high myopes  - stable  - monitor  8. 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  - discussed diagnosis and progression  - approaching visual significance -- OD > OS  - under the expert management of Dr. Charlotte SanesMcCuen -- will be cleared for cataract surgery ~6 wks after laser retinopexy and after clearance of vitreous hemorrhages  9. 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 Charlotte SanesMcCuen -- brimonidine TID OU   Ophthalmic Meds Ordered this visit:  No orders of the defined types were placed in this encounter.      Return in about 10 days (around 07/10/2019) for f/u VH OS, DFE, OCT.  There are no Patient Instructions on file for this visit.   Explained the diagnoses, plan, and follow up with the patient and they expressed understanding.  Patient expressed understanding of the importance of proper follow up care.   This document serves as a  record of services personally performed by Karie ChimeraBrian G. Lytle Malburg, MD, PhD. It was created on their behalf by Laurian BrimAmanda Brown, OA, an ophthalmic assistant. The creation of this record is the provider's dictation and/or activities during the visit.    Electronically signed by: Laurian BrimAmanda Brown, OA  07.09.2020 11:31 PM    Karie ChimeraBrian G. Hadi Dubin, M.D., Ph.D. Diseases & Surgery of the Retina and Vitreous [facility]  I have reviewed the above documentation for accuracy and completeness, and I agree with the above. Karie ChimeraBrian G. Edgar Corrigan, M.D., Ph.D. 07/02/19 11:32 PM     Abbreviations: M myopia (nearsighted); A astigmatism; H hyperopia (farsighted); P presbyopia; Mrx spectacle prescription;  CTL contact lenses; OD right eye; OS left eye; OU both eyes  XT exotropia; ET esotropia; PEK punctate epithelial keratitis; PEE punctate epithelial erosions; DES dry eye syndrome; MGD meibomian gland dysfunction; ATs artificial tears; PFAT's preservative free artificial tears; NSC nuclear sclerotic cataract; PSC posterior subcapsular cataract; ERM epi-retinal membrane; PVD posterior vitreous detachment; RD retinal detachment; DM diabetes mellitus; DR diabetic retinopathy; NPDR non-proliferative diabetic retinopathy; PDR proliferative diabetic  retinopathy; CSME clinically significant macular edema; DME diabetic macular edema; dbh dot blot hemorrhages; CWS cotton wool spot; POAG primary open angle glaucoma; C/D cup-to-disc ratio; HVF humphrey visual field; GVF goldmann visual field; OCT optical coherence tomography; IOP intraocular pressure; BRVO Branch retinal vein occlusion; CRVO central retinal vein occlusion; CRAO central retinal artery occlusion; BRAO branch retinal artery occlusion; RT retinal tear; SB scleral buckle; PPV pars plana vitrectomy; VH Vitreous hemorrhage; PRP panretinal laser photocoagulation; IVK intravitreal kenalog; VMT vitreomacular traction; MH Macular hole;  NVD neovascularization of the disc; NVE  neovascularization elsewhere; AREDS age related eye disease study; ARMD age related macular degeneration; POAG primary open angle glaucoma; EBMD epithelial/anterior basement membrane dystrophy; ACIOL anterior chamber intraocular lens; IOL intraocular lens; PCIOL posterior chamber intraocular lens; Phaco/IOL phacoemulsification with intraocular lens placement; PRK photorefractive keratectomy; LASIK laser assisted in situ keratomileusis; HTN hypertension; DM diabetes mellitus; COPD chronic obstructive pulmonary disease

## 2019-06-30 ENCOUNTER — Other Ambulatory Visit: Payer: Self-pay

## 2019-06-30 ENCOUNTER — Ambulatory Visit (INDEPENDENT_AMBULATORY_CARE_PROVIDER_SITE_OTHER): Payer: PRIVATE HEALTH INSURANCE | Admitting: Ophthalmology

## 2019-06-30 ENCOUNTER — Encounter (INDEPENDENT_AMBULATORY_CARE_PROVIDER_SITE_OTHER): Payer: Self-pay | Admitting: Ophthalmology

## 2019-06-30 DIAGNOSIS — H33313 Horseshoe tear of retina without detachment, bilateral: Secondary | ICD-10-CM

## 2019-06-30 DIAGNOSIS — H25813 Combined forms of age-related cataract, bilateral: Secondary | ICD-10-CM

## 2019-06-30 DIAGNOSIS — H3581 Retinal edema: Secondary | ICD-10-CM | POA: Diagnosis not present

## 2019-06-30 DIAGNOSIS — H40003 Preglaucoma, unspecified, bilateral: Secondary | ICD-10-CM

## 2019-06-30 DIAGNOSIS — Z9889 Other specified postprocedural states: Secondary | ICD-10-CM

## 2019-06-30 DIAGNOSIS — H52203 Unspecified astigmatism, bilateral: Secondary | ICD-10-CM

## 2019-06-30 DIAGNOSIS — H3322 Serous retinal detachment, left eye: Secondary | ICD-10-CM

## 2019-06-30 DIAGNOSIS — H35413 Lattice degeneration of retina, bilateral: Secondary | ICD-10-CM

## 2019-06-30 DIAGNOSIS — H4313 Vitreous hemorrhage, bilateral: Secondary | ICD-10-CM

## 2019-06-30 DIAGNOSIS — H3323 Serous retinal detachment, bilateral: Secondary | ICD-10-CM

## 2019-06-30 DIAGNOSIS — H5213 Myopia, bilateral: Secondary | ICD-10-CM

## 2019-06-30 DIAGNOSIS — H524 Presbyopia: Secondary | ICD-10-CM

## 2019-07-02 ENCOUNTER — Encounter (INDEPENDENT_AMBULATORY_CARE_PROVIDER_SITE_OTHER): Payer: Self-pay | Admitting: Ophthalmology

## 2019-07-04 ENCOUNTER — Telehealth: Payer: Self-pay | Admitting: Cardiology

## 2019-07-04 NOTE — Telephone Encounter (Signed)
Called patient about recall.  He currently is dealing with a disability based on an eye problem.  He requests we call him back in a month to try and schedule.

## 2019-07-06 ENCOUNTER — Ambulatory Visit: Admit: 2019-07-06 | Payer: PRIVATE HEALTH INSURANCE | Admitting: Ophthalmology

## 2019-07-06 SURGERY — 25 GAUGE PARS PLANA VITRECTOMY WITH 20 GAUGE MVR PORT FOR MACULAR HOLE
Anesthesia: General | Laterality: Right

## 2019-07-09 NOTE — Progress Notes (Addendum)
[facility] - Clinic Note  07/10/2019     CHIEF COMPLAINT Patient presents for Post-op Follow-up   HISTORY OF PRESENT ILLNESS: Bradley Watson is a 58 y.o. male who presents to the clinic today for:   HPI    Post-op Follow-up    In both eyes.  Discomfort includes floaters.  Vision is improved and is blurred at distance.  I, the attending physician,  performed the HPI with the patient and updated documentation appropriately.          Comments    58 y/o male pt here for POV.  S/p multiple laser retinopexy for retinal tears w/focal RDs and VH OU.  VA improved OD; stable OS.  Can still see blood/fluid in vision OU.  Denies pain, flashes.  Brimonidine TID OU.       Last edited by Rennis ChrisZamora, Laneta Guerin, MD on 07/10/2019 11:33 AM. (History)       Referring physician: [name], [name], MD [address]LO[address] [place] [id number]  HISTORICAL INFORMATION:   Selected notes from the MEDICAL RECORD NUMBER Referred by Dr. Maris Bergerhristine McCuen for concern of retinal tear OS LEE: 06.10.20 (C. McCuen) [BCVA: OD: 20/40 OS: 20/25] Ocular Hx- PMH-glaucoma suspect, lattice OU, NS OU, myopia, h/o RK OU   CURRENT MEDICATIONS: Current Outpatient Medications (Ophthalmic Drugs)  Medication Sig  . brimonidine (ALPHAGAN) 0.2 % ophthalmic solution Place 1 drop into both eyes 3 (three) times daily.  . prednisoLONE acetate (PRED FORTE) 1 % ophthalmic suspension Place 1 drop into the right eye 4 (four) times daily.   No current facility-administered medications for this visit.  (Ophthalmic Drugs)   Current Outpatient Medications (Other)  Medication Sig  . aspirin 81 MG EC tablet Take 1 tablet (81 mg total) by mouth daily.  . cyclobenzaprine (FLEXERIL) 10 MG tablet Take 1 tablet (10 mg total) by mouth 2 (two) times daily as needed for up to 20 doses for muscle spasms.  . Diclofenac Sodium 2 % SOLN Place 2 g onto the skin 2 (two) times daily.  Marland Kitchen. doxycycline (VIBRA-TABS) 100 MG  tablet Take 1 tablet (100 mg total) by mouth 2 (two) times daily.  . hydrOXYzine (ATARAX/VISTARIL) 10 MG tablet Take 30-60 mg by mouth daily as needed for itching.   Marland Kitchen. ibuprofen (ADVIL,MOTRIN) 800 MG tablet Take 1 tablet (800 mg total) by mouth every 8 (eight) hours as needed for up to 30 doses.  Marland Kitchen. omeprazole (PRILOSEC) 20 MG capsule Take 20 mg by mouth daily as needed (heartburn).   . tacrolimus (PROTOPIC) 0.1 % ointment APP EXT TO THE SKIN BID  . triamcinolone ointment (KENALOG) 0.1 % APPLY ON THE SKIN TWICE A DAY AS NEEDED   No current facility-administered medications for this visit.  (Other)      REVIEW OF SYSTEMS: ROS    Positive for: Gastrointestinal, Cardiovascular, Eyes   Negative for: Constitutional, Neurological, Skin, Genitourinary, Musculoskeletal, HENT, Endocrine, Respiratory, Psychiatric, Allergic/Imm, Heme/Lymph   Last edited by Celine MansBaxley, Andrew G, COA on 07/10/2019 10:27 AM. (History)       ALLERGIES Allergies  Allergen Reactions  . Penicillins Anxiety and Other (See Comments)    "Made me feel like gravity had increased and it was pulling me down" (10/27/2012) Did it involve swelling of the face/tongue/throat, SOB, or low BP? No Did it involve sudden or severe rash/hives, skin peeling, or any reaction on the inside of your mouth or nose? No Did you need to seek medical  attention at a hospital or doctor's office? No When did it last happen?Over 10 years ago If all above answers are "NO", may proceed with cephalosporin use.  Marland Kitchen Lisinopril Cough    PAST MEDICAL HISTORY Past Medical History:  Diagnosis Date  . Carotid stenosis    Pre-CABG Dopplers: RICA 40-59%;  Carotid US (05/2014):  R 8-11%; normal LICA  . Cataract    OU  . Childhood asthma   . Cluster headaches 1985   Mid 80's  . Coronary artery disease    a.  LHC 10/27/12 demonstrated pLAD 70%, AV CFX occluded, pOM 80%, dCFX collateralized the LAD and RCA, RCA without obstructive lesions, EF 50% with  inferobasal HK;  b.  Echo 10/30/12: EF 91-47%, grade 1 diastolic dysfunction, mild LAE, normal LV wall motion;  c. s/p CABG 10/31/12 with Dr. Roxan Hockey:  L-LAD, left radial-OM;   b.  ETT-Nuc (05/2014):  + ECG changes, no ischemia; low risk  . Diverticulitis 02-2012   CT scan   . Fatty liver 02-2012   Mild- CT scan   . GERD (gastroesophageal reflux disease)   . HLD (hyperlipidemia)   . Hx of cardiovascular stress test    ETT-Myovew (05/2014):  + ECG changes with down-sloping ST depression; images with apical thinning but no ischemia, EF 52%; Low Risk  . Iron deficiency anemia 1980's   Past Surgical History:  Procedure Laterality Date  . CARDIAC CATHETERIZATION  10/27/2012  . CORONARY ARTERY BYPASS GRAFT  10/31/2012   Procedure: CORONARY ARTERY BYPASS GRAFTING (CABG);  Surgeon: Melrose Nakayama, MD;  Location: [facility];  Service: Open Heart Surgery;  Laterality: N/A;  . LEFT HEART CATHETERIZATION WITH CORONARY ANGIOGRAM N/A 10/27/2012   Procedure: LEFT HEART CATHETERIZATION WITH CORONARY ANGIOGRAM;  Surgeon: Hillary Bow, MD;  [facility] CATH LAB;  Service: Cardiovascular;  Laterality: N/A;  . RADIAL ARTERY HARVEST  10/31/2012   Procedure: RADIAL ARTERY HARVEST;  Surgeon: Melrose Nakayama, MD;  Location: [facility];  Service: Open Heart Surgery;  Laterality: Left;  . RADIAL KERATOTOMY  1985   bilaterally    FAMILY HISTORY Family History  Problem Relation Age of Onset  . Lung cancer Father   . Diabetes Mellitus II Paternal Grandmother     SOCIAL HISTORY Social History   Tobacco Use  . Smoking status: Former Smoker    Packs/day: 0.12    Years: 1.50    Pack years: 0.18  . Smokeless tobacco: Never Used  Substance Use Topics  . Alcohol use: Yes    Comment: occ  . Drug use: No    Types: "Crack" cocaine         OPHTHALMIC EXAM:  Base Eye Exam    Visual Acuity (Snellen - Linear)      Right Left   Dist cc 20/50 -2 20/20 -2   Dist ph cc 20/30 -2    Correction: Glasses        Tonometry (Tonopen, 10:29 AM)      Right Left   Pressure 15 13       Pupils      Dark Light Shape React APD   Right 3 2 Round Brisk None   Left 3 2 Round Brisk None       Visual Fields (Counting fingers)      Left Right    Full Full       Extraocular Movement      Right Left    Full, Ortho Full, Ortho  Neuro/Psych    Oriented x3: Yes   Mood/Affect: Normal       Dilation    Both eyes: 1.0% Mydriacyl, 2.5% Phenylephrine @ 10:29 AM        Slit Lamp and Fundus Exam    Slit Lamp Exam      Right Left   Lids/Lashes Dermatochalasis - mild MGD Dermatochalasis - upper lid   Conjunctiva/Sclera Melanosis Melanosis, trace temporal Pinguecula   Cornea 8 RK scars, mild Arcus 8 RK scars, mild Arcus   Anterior Chamber Deep and clear Deep and quiet   Iris Round and dilated Round and dilated   Lens 2+ Nuclear sclerosis, 2+ Cortical cataract 2+ Nuclear sclerosis, 2-3+ Cortical cataract   Vitreous Vitreous syneresis, Posterior vitreous detachment, vitreous condensations, diffuse VH slightly improved and settling inferiorly + RBC Vitreous syneresis, +pigment, Posterior vitreous detachment, blood stained vitreous condensations centrally-persistent, mild residual blot clots settling inferiorly-persistent       Fundus Exam      Right Left   Disc Sharp rim, +cupping, mild pallor, Peripapillary atrophy Obscured by VH, but perfused.  Mild cupping.   C/D Ratio 0.8 0.65   Macula Very hazy view, flat, Blunted foveal reflex, trace Epiretinal membrane, RPE mottling and clumping, No heme or edema hazy view, grossly flat, trace ERM, no heme or edema   Vessels Vascular attenuation, mild tortuosity Vascular attenuation, Copper wiring   Periphery Hazy view temporally; Attached, pigmented lattice at 1200, 0100, HST with bridging vessel and heme from 0130-0200, pigmented paving stone from 0600-0730, pigmented CR scar and lattice at 1030 - light laser changes, small HST at 1045, laser changes  around superior lesions Retinal break at 1200, HST from 0100-0130 with cuff of +SRF and +lattice within flap, round holes at 0200 with +SRF, pigmented CR scar at 0200 just anterior to holes, pigmented paving stone inferiorly -- good laser surrounding all lesions          IMAGING AND PROCEDURES  Imaging and Procedures for @TODAY @  OCT, Retina - OU - Both Eyes       Right Eye Quality was good. Central Foveal Thickness: 253. Progression has been stable. Findings include normal foveal contour, no SRF, no IRF, myopic contour (Persistent vitreous opacities - mild interval improvement ).   Left Eye Quality was good. Central Foveal Thickness: 276. Progression has been stable. Findings include normal foveal contour, no IRF, no SRF, myopic contour (Persistent vitreous opacities - mild interval improvement).   Notes *Images captured and stored on drive  Diagnosis / Impression:  NFP, no IRF/SRF OU Myopic contour OU Persistent vitreous opacities OU - mild interval improvement    Clinical management:  See below  Abbreviations: NFP - Normal foveal profile. CME - cystoid macular edema. PED - pigment epithelial detachment. IRF - intraretinal fluid. SRF - subretinal fluid. EZ - ellipsoid zone. ERM - epiretinal membrane. ORA - outer retinal atrophy. ORT - outer retinal tubulation. SRHM - subretinal hyper-reflective material        Color Fundus Photography Optos - OU - Both Eyes       Right Eye Progression has been stable. Disc findings include increased cup to disc ratio, pallor, thinning of rim. Macula : (Obscured by [facility]). Vessels : attenuated. Periphery : hemorrhage, lattice, RPE abnormality, tear (Inf hemisphere obscured by [facility]).   Left Eye Progression has improved. Disc findings include normal observations. Macula : flat (Mild residual central VH partially obscuring disc and macula). Vessels : attenuated. Periphery : tear, RPE abnormality, lattice (Tear  at 130 w/ good laser surrounding).    Notes **Images stored on drive**  Impression: OD: persistent inferior VH;  Tear at 130 w/ good laser surrounding OS: mild persistent central VH; tear at 130 w/ good laser surrounding                 ASSESSMENT/PLAN:    ICD-10-CM   1. Retinal tear of both eyes  H33.313 Color Fundus Photography Optos - OU - Both Eyes  2. Bilateral retinal detachment  H33.23 Color Fundus Photography Optos - OU - Both Eyes  3. Lattice degeneration of both retinas  H35.413   4. Vitreous hemorrhage of both eyes (HCC)  H43.13 Color Fundus Photography Optos - OU - Both Eyes  5. Retinal edema  H35.81 OCT, Retina - OU - Both Eyes  6. 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 History of radial keratotomy  Z98.890   8. Combined forms of age-related cataract of both eyes  H25.813   9. Glaucoma suspect of both eyes  H40.003     1-5. Retinal tears with focal retinal detachments and vitreous hemorrhage OU          Lattice degeneration w/ atrophic holes OU  - s/p laser retinopexy OD (6.26.20) to large superonasal HST and superior patches of lattice, fill-in (07.01.20) to HST at 1045 -- light laser changes, may need some touch up once VH improves a little more  - s/p laser retinopexy OS (06.10.20) and touch up laser with Dr. Ashley RoyaltyMatthews 6.19.2020 -- good laser surrounding lesions OS (HST at 0130 with cuff of +SRF and +lattice within flap, scattered patches of lattice degeneration)  - both eyes with diffuse vitreous hemorrhages 2/2 RTs w/ bridging vessels, vitreous heme slightly improved OD, vit heme clearing OS  - discussed importance of VH precautions  -- minimize activities, keep head elevated, avoid ASA/NSAIDs/blood thinners as able  - f/u on 07.28.20 for possible laser and/or cryo OD  6,7. History of high myopia s/p 8-cut Radial Keratotomy OU  - discussed increased risk of lattice degeneration, RT, RD in high myopes  - stable  - monitor  8. 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  - discussed diagnosis and progression  - approaching visual significance -- OD > OS  - under the expert management of Dr. Charlotte SanesMcCuen -- will be cleared for cataract surgery ~6 wks after laser retinopexy and after clearance of vitreous hemorrhages  9. 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 Charlotte SanesMcCuen -- brimonidine TID OU   Ophthalmic Meds Ordered this visit:  No orders of the defined types were placed in this encounter.      Return 07.28.20, for DFE, OCT, possible laser or cryo.  There are no Patient Instructions on file for this visit.   Explained the diagnoses, plan, and follow up with the patient and they expressed understanding.  Patient expressed understanding of the importance of proper follow up care.   This document serves as a record of services personally performed by Karie ChimeraBrian G. Juleon Narang, MD, PhD. It was created on their behalf by Laurian BrimAmanda Brown, OA, an ophthalmic assistant. The creation of this record is the provider's dictation and/or activities during the visit.    Electronically signed  by: Laurian Brim, OA  07.19.2020 11:43 AM    Karie Chimera, M.D., Ph.D. Diseases & Surgery of the Retina and Vitreous [facility]  I have reviewed the above documentation for accuracy and completeness, and I agree with the above. Karie Chimera, M.D., Ph.D. 07/10/19 11:43 AM    Abbreviations: M myopia (nearsighted); A astigmatism; H hyperopia (farsighted); P presbyopia; Mrx spectacle prescription;  CTL contact lenses; OD right eye; OS left eye; OU both eyes  XT exotropia; ET esotropia; PEK punctate epithelial keratitis; PEE punctate epithelial erosions; DES dry eye syndrome; MGD meibomian gland dysfunction; ATs artificial tears; PFAT's preservative free  artificial tears; NSC nuclear sclerotic cataract; PSC posterior subcapsular cataract; ERM epi-retinal membrane; PVD posterior vitreous detachment; RD retinal detachment; DM diabetes mellitus; DR diabetic retinopathy; NPDR non-proliferative diabetic retinopathy; PDR proliferative diabetic retinopathy; CSME clinically significant macular edema; DME diabetic macular edema; dbh dot blot hemorrhages; CWS cotton wool spot; POAG primary open angle glaucoma; C/D cup-to-disc ratio; HVF humphrey visual field; GVF goldmann visual field; OCT optical coherence tomography; IOP intraocular pressure; BRVO Branch retinal vein occlusion; CRVO central retinal vein occlusion; CRAO central retinal artery occlusion; BRAO branch retinal artery occlusion; RT retinal tear; SB scleral buckle; PPV pars plana vitrectomy; VH Vitreous hemorrhage; PRP panretinal laser photocoagulation; IVK intravitreal kenalog; VMT vitreomacular traction; MH Macular hole;  NVD neovascularization of the disc; NVE neovascularization elsewhere; AREDS age related eye disease study; ARMD age related macular degeneration; POAG primary open angle glaucoma; EBMD epithelial/anterior basement membrane dystrophy; ACIOL anterior chamber intraocular lens; IOL intraocular lens; PCIOL posterior chamber intraocular lens; Phaco/IOL phacoemulsification with intraocular lens placement; PRK photorefractive keratectomy; LASIK laser assisted in situ keratomileusis; HTN hypertension; DM diabetes mellitus; COPD chronic obstructive pulmonary disease

## 2019-07-10 ENCOUNTER — Ambulatory Visit (INDEPENDENT_AMBULATORY_CARE_PROVIDER_SITE_OTHER): Payer: PRIVATE HEALTH INSURANCE | Admitting: Ophthalmology

## 2019-07-10 ENCOUNTER — Other Ambulatory Visit: Payer: Self-pay

## 2019-07-10 ENCOUNTER — Encounter (INDEPENDENT_AMBULATORY_CARE_PROVIDER_SITE_OTHER): Payer: Self-pay | Admitting: Ophthalmology

## 2019-07-10 DIAGNOSIS — H33313 Horseshoe tear of retina without detachment, bilateral: Secondary | ICD-10-CM

## 2019-07-10 DIAGNOSIS — H40003 Preglaucoma, unspecified, bilateral: Secondary | ICD-10-CM

## 2019-07-10 DIAGNOSIS — H35413 Lattice degeneration of retina, bilateral: Secondary | ICD-10-CM

## 2019-07-10 DIAGNOSIS — H3581 Retinal edema: Secondary | ICD-10-CM

## 2019-07-10 DIAGNOSIS — H3323 Serous retinal detachment, bilateral: Secondary | ICD-10-CM

## 2019-07-10 DIAGNOSIS — H4313 Vitreous hemorrhage, bilateral: Secondary | ICD-10-CM | POA: Diagnosis not present

## 2019-07-10 DIAGNOSIS — H25813 Combined forms of age-related cataract, bilateral: Secondary | ICD-10-CM

## 2019-07-10 DIAGNOSIS — H5213 Myopia, bilateral: Secondary | ICD-10-CM

## 2019-07-10 DIAGNOSIS — H524 Presbyopia: Secondary | ICD-10-CM

## 2019-07-10 DIAGNOSIS — Z9889 Other specified postprocedural states: Secondary | ICD-10-CM

## 2019-07-10 DIAGNOSIS — H52203 Unspecified astigmatism, bilateral: Secondary | ICD-10-CM

## 2019-07-16 NOTE — Progress Notes (Signed)
[facility] - Clinic Note  07/18/2019     CHIEF COMPLAINT Patient presents for Post-op Follow-up   HISTORY OF PRESENT ILLNESS: Bradley Watson is a 58 y.o. male who presents to the clinic today for:   HPI    Post-op Follow-up    In right eye.  Vision is improved.  I, the attending physician,  performed the HPI with the patient and updated documentation appropriately.          Comments    Patient states his vision is improving in his right eye.  Patient denies eye pain or discomfort and denies any new or worsening floaters or fol.       Last edited by Rennis Chris, MD on 07/18/2019 11:29 AM. (History)       Referring physician: [name], MD [address] [place],  [place] [address]  HISTORICAL INFORMATION:   Selected notes from the MEDICAL RECORD NUMBER Referred by Dr. Maris Berger for concern of retinal tear OS LEE: 06.10.20 (C. McCuen) [BCVA: OD: 20/40 OS: 20/25] Ocular Hx- PMH-glaucoma suspect, lattice OU, NS OU, myopia, h/o RK OU   CURRENT MEDICATIONS: Current Outpatient Medications (Ophthalmic Drugs)  Medication Sig  . brimonidine (ALPHAGAN) 0.2 % ophthalmic solution Place 1 drop into both eyes 3 (three) times daily.  . prednisoLONE acetate (PRED FORTE) 1 % ophthalmic suspension Place 1 drop into the right eye 4 (four) times daily.   No current facility-administered medications for this visit.  (Ophthalmic Drugs)   Current Outpatient Medications (Other)  Medication Sig  . aspirin 81 MG EC tablet Take 1 tablet (81 mg total) by mouth daily.  . cyclobenzaprine (FLEXERIL) 10 MG tablet Take 1 tablet (10 mg total) by mouth 2 (two) times daily as needed for up to 20 doses for muscle spasms.  . Diclofenac Sodium 2 % SOLN Place 2 g onto the skin 2 (two) times daily.  Marland Kitchen doxycycline (VIBRA-TABS) 100 MG tablet Take 1 tablet (100 mg total) by mouth 2 (two) times daily.  . hydrOXYzine (ATARAX/VISTARIL) 10 MG tablet Take 30-60 mg by  mouth daily as needed for itching.   Marland Kitchen ibuprofen (ADVIL,MOTRIN) 800 MG tablet Take 1 tablet (800 mg total) by mouth every 8 (eight) hours as needed for up to 30 doses.  Marland Kitchen omeprazole (PRILOSEC) 20 MG capsule Take 20 mg by mouth daily as needed (heartburn).   . tacrolimus (PROTOPIC) 0.1 % ointment APP EXT TO THE SKIN BID  . triamcinolone ointment (KENALOG) 0.1 % APPLY ON THE SKIN TWICE A DAY AS NEEDED   No current facility-administered medications for this visit.  (Other)      REVIEW OF SYSTEMS: ROS    Positive for: Gastrointestinal, Cardiovascular, Eyes   Negative for: Constitutional, Neurological, Skin, Genitourinary, Musculoskeletal, HENT, Endocrine, Respiratory, Psychiatric, Allergic/Imm, Heme/Lymph   Last edited by Corrinne Eagle on 07/18/2019 10:07 AM. (History)       ALLERGIES Allergies  Allergen Reactions  . Penicillins Anxiety and Other (See Comments)    "Made me feel like gravity had increased and it was pulling me down" (10/27/2012) Did it involve swelling of the face/tongue/throat, SOB, or low BP? No Did it involve sudden or severe rash/hives, skin peeling, or any reaction on the inside of your mouth or nose? No Did you need to seek medical attention at a hospital or doctor's office? No When did it last happen?Over 10 years ago If all above answers are "NO", may proceed with cephalosporin  use.  . Lisinopril Cough    PAST MEDICAL HISTORY Past Medical History:  Diagnosis Date  . Carotid stenosis    Pre-CABG Dopplers: RICA 40-59%;  Carotid US (05/2014):  R 1-39%; normal LICA  . Cataract    OU  . Childhood asthma   . Cluster headaches 1985   Mid 80's  . Coronary artery disease    a.  LHC 10/27/12 demonstrated pLAD 70%, AV CFX occluded, pOM 80%, dCFX collateralized the LAD and RCA, RCA without obstructive lesions, EF 50% with inferobasal HK;  b.  Echo 10/30/12: EF 55-60%, grade 1 diastolic dysfunction, mild LAE, normal LV wall motion;  c. s/p CABG 10/31/12 with Dr.  Dorris FetchHendrickson:  L-LAD, left radial-OM;   b.  ETT-Nuc (05/2014):  + ECG changes, no ischemia; low risk  . Diverticulitis 02-2012   CT scan   . Fatty liver 02-2012   Mild- CT scan   . GERD (gastroesophageal reflux disease)   . HLD (hyperlipidemia)   . Hx of cardiovascular stress test    ETT-Myovew (05/2014):  + ECG changes with down-sloping ST depression; images with apical thinning but no ischemia, EF 52%; Low Risk  . Iron deficiency anemia 1980's   Past Surgical History:  Procedure Laterality Date  . CARDIAC CATHETERIZATION  10/27/2012  . CORONARY ARTERY BYPASS GRAFT  10/31/2012   Procedure: CORONARY ARTERY BYPASS GRAFTING (CABG);  Surgeon: Loreli SlotSteven C Hendrickson, MD;  [facility];  Service: Open Heart Surgery;  Laterality: N/A;  . LEFT HEART CATHETERIZATION WITH CORONARY ANGIOGRAM N/A 10/27/2012   Procedure: LEFT HEART CATHETERIZATION WITH CORONARY ANGIOGRAM;  Surgeon: Herby Abrahamhomas D Stuckey, MD;  [facility];  Service: Cardiovascular;  Laterality: N/A;  . RADIAL ARTERY HARVEST  10/31/2012   Procedure: RADIAL ARTERY HARVEST;  Surgeon: Loreli SlotSteven C Hendrickson, MD;  [facility];  Service: Open Heart Surgery;  Laterality: Left;  . RADIAL KERATOTOMY  1985   bilaterally    FAMILY HISTORY Family History  Problem Relation Age of Onset  . Lung cancer Father   . Diabetes Mellitus II Paternal Grandmother     SOCIAL HISTORY Social History   Tobacco Use  . Smoking status: Former Smoker    Packs/day: 0.12    Years: 1.50    Pack years: 0.18  . Smokeless tobacco: Never Used  Substance Use Topics  . Alcohol use: Yes    Comment: occ  . Drug use: No    Types: "Crack" cocaine         OPHTHALMIC EXAM:  Base Eye Exam    Visual Acuity (Snellen - Linear)      Right Left   Dist cc 20/60 20/20 -2   Dist ph cc 20/40 +1    Correction: Glasses       Tonometry (Tonopen, 10:12 AM)      Right Left   Pressure 21 21       Pupils      Dark Light Shape React APD   Right 3 2 Round  Brisk 0   Left 3 2 Round Brisk 0       Visual Fields      Left Right    Full Full       Extraocular Movement      Right Left    Full Full       Neuro/Psych    Oriented x3: Yes   Mood/Affect: Normal       Dilation    Both eyes: 1.0% Mydriacyl, 2.5% Phenylephrine @  10:12 AM        Slit Lamp and Fundus Exam    Slit Lamp Exam      Right Left   Lids/Lashes Dermatochalasis - mild MGD Dermatochalasis - upper lid   Conjunctiva/Sclera Melanosis Melanosis, trace temporal Pinguecula   Cornea 8 RK scars, mild Arcus 8 RK scars, mild Arcus   Anterior Chamber Deep and clear Deep and quiet   Iris Round and dilated Round and dilated   Lens 2+ Nuclear sclerosis, 2+ Cortical cataract 2+ Nuclear sclerosis, 2-3+ Cortical cataract   Vitreous Vitreous syneresis, Posterior vitreous detachment, vitreous condensations, diffuse VH consolidating/clearing centrally and settling inferiorly + RBC Vitreous syneresis, +pigment, Posterior vitreous detachment, blood stained vitreous condensations centrally-persistent, mild residual blot clots settling inferiorly-persistent       Fundus Exam      Right Left   Disc Sharp rim, +cupping, mild pallor, Peripapillary atrophy Obscured by VH, but perfused.  Mild cupping.   C/D Ratio 0.8 0.65   Macula Very hazy view, flat, Blunted foveal reflex, trace Epiretinal membrane, RPE mottling and clumping, No heme or edema hazy view, grossly flat, trace ERM, no heme or edema   Vessels Vascular attenuation, mild tortuosity Vascular attenuation, Copper wiring   Periphery Hazy view temporally; Attached, pigmented lattice at 1200, 0100, HST with bridging vessel and heme from 0130-0200, pigmented paving stone from 0600-0730, pigmented CR scar and lattice at 1030 - light laser changes, small HST at 1045, laser changes around superior lesions Retinal break at 1200, HST from 0100-0130 with cuff of +SRF and +lattice within flap, round holes at 0200 with +SRF, pigmented CR scar at 0200  just anterior to holes, pigmented paving stone inferiorly -- good laser surrounding all lesions        Refraction    Wearing Rx      Sphere Cylinder Axis Add   Right -8.00 +1.50 168 +2.00   Left -5.50 +1.00 036 +2.00   Type: PAL          IMAGING AND PROCEDURES  Imaging and Procedures for @TODAY @  OCT, Retina - OU - Both Eyes       Right Eye Quality was good. Central Foveal Thickness: 252. Progression has been stable. Findings include normal foveal contour, no SRF, no IRF, myopic contour (Persistent vitreous opacities - mild interval improvement ).   Left Eye Quality was good. Central Foveal Thickness: 276. Progression has been stable. Findings include normal foveal contour, no IRF, no SRF, myopic contour (Persistent vitreous opacities - mild interval improvement).   Notes *Images captured and stored on drive  Diagnosis / Impression:  NFP, no IRF/SRF OU Myopic contour OU Persistent vitreous opacities OU - mild interval improvement    Clinical management:  See below  Abbreviations: NFP - Normal foveal profile. CME - cystoid macular edema. PED - pigment epithelial detachment. IRF - intraretinal fluid. SRF - subretinal fluid. EZ - ellipsoid zone. ERM - epiretinal membrane. ORA - outer retinal atrophy. ORT - outer retinal tubulation. SRHM - subretinal hyper-reflective material                 ASSESSMENT/PLAN:    ICD-10-CM   1. Retinal tear of both eyes  H33.313   2. Bilateral retinal detachment  H33.23   3. Lattice degeneration of both retinas  H35.413   4. Vitreous hemorrhage of both eyes (HCC)  H43.13   5. Retinal edema  H35.81 OCT, Retina - OU - Both Eyes  6. Myopia of both eyes with astigmatism  and presbyopia  H52.13    H52.203    H52.4   7. History of radial keratotomy  Z98.890   8. Combined forms of age-related cataract of both eyes  H25.813   9. Glaucoma suspect of both eyes  H40.003   10. Left retinal detachment  H33.22     1-5. Retinal tears  with focal retinal detachments and vitreous hemorrhage OU          Lattice degeneration w/ atrophic holes OU  - s/p laser retinopexy OD (6.26.20) to large superonasal HST and superior patches of lattice, fill-in (07.01.20) to HST at 1045 -- light laser changes in ST quad, may need some touch up once [facility] improves a little more  - s/p laser retinopexy OS (06.10.20) and touch up laser with Dr. Zigmund Daniel 6.19.2020 -- good laser surrounding lesions OS (HST at 0130 with cuff of +SRF and +lattice within flap, scattered patches of lattice degeneration)  - both eyes with diffuse vitreous hemorrhages 2/2 RTs w/ bridging vessels, vitreous heme slightly improved OD, vit heme clearing OS  - discussed importance of VH precautions  -- minimize activities, keep head elevated, avoid ASA/NSAIDs/blood thinners as able  - f/u 7-10 days for recheck, possible touch up laser or cryo OD  6,7. History of high myopia s/p 8-cut Radial Keratotomy OU  - discussed increased risk of lattice degeneration, RT, RD in high myopes  - stable  - monitor  8. 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  - discussed diagnosis and progression  - approaching visual significance -- OD > OS  - under the expert management of Dr. Ellie Lunch -- will be cleared for cataract surgery ~6 wks after laser retinopexy and after clearance of vitreous hemorrhages  9. Glaucoma Suspect OU  - IOP 21 OU  - +cupping  - +family history of glaucoma  - expert management per Dr. Ellie Lunch -- brimonidine TID OU   Ophthalmic Meds Ordered this visit:  No orders of the defined types were placed in this encounter.      Return 7-10 days, for dfe, oct.  There are no Patient Instructions on file for this visit.   Explained the diagnoses, plan, and follow up with the patient and they expressed understanding.  Patient expressed understanding of the importance of proper  follow up care.   This document serves as a record of services personally performed by Gardiner Sleeper, MD, PhD. It was created on their behalf by Ernest Mallick, OA, an ophthalmic assistant. The creation of this record is the provider's dictation and/or activities during the visit.    Electronically signed by: Ernest Mallick, OA  07.26.2020 2:43 PM     Gardiner Sleeper, M.D., Ph.D. Diseases & Surgery of the Retina and Vitreous [facility]  I have reviewed the above documentation for accuracy and completeness, and I agree with the above. Gardiner Sleeper, M.D., Ph.D. 07/20/19 2:46 PM    Abbreviations: M myopia (nearsighted); A astigmatism; H hyperopia (farsighted); P presbyopia; Mrx spectacle prescription;  CTL contact lenses; OD right eye; OS left eye; OU both eyes  XT exotropia; ET esotropia; PEK punctate epithelial keratitis; PEE punctate epithelial erosions; DES dry eye syndrome; MGD meibomian gland dysfunction; ATs artificial tears; PFAT's preservative free artificial tears; [place] nuclear sclerotic cataract; PSC posterior subcapsular cataract; ERM epi-retinal membrane; PVD posterior vitreous detachment; RD retinal detachment; DM diabetes mellitus; DR diabetic retinopathy;  NPDR non-proliferative diabetic retinopathy; PDR proliferative diabetic retinopathy; CSME clinically significant macular edema; DME diabetic macular edema; dbh dot blot hemorrhages; CWS cotton wool spot; POAG primary open angle glaucoma; C/D cup-to-disc ratio; HVF humphrey visual field; GVF goldmann visual field; OCT optical coherence tomography; IOP intraocular pressure; BRVO Branch retinal vein occlusion; CRVO central retinal vein occlusion; CRAO central retinal artery occlusion; BRAO branch retinal artery occlusion; RT retinal tear; SB scleral buckle; PPV pars plana vitrectomy; VH Vitreous hemorrhage; PRP panretinal laser photocoagulation; IVK intravitreal kenalog; VMT vitreomacular traction; MH Macular hole;   NVD neovascularization of the disc; NVE neovascularization elsewhere; AREDS age related eye disease study; ARMD age related macular degeneration; POAG primary open angle glaucoma; EBMD epithelial/anterior basement membrane dystrophy; ACIOL anterior chamber intraocular lens; IOL intraocular lens; PCIOL posterior chamber intraocular lens; Phaco/IOL phacoemulsification with intraocular lens placement; PRK photorefractive keratectomy; LASIK laser assisted in situ keratomileusis; HTN hypertension; DM diabetes mellitus; COPD chronic obstructive pulmonary disease

## 2019-07-18 ENCOUNTER — Encounter (INDEPENDENT_AMBULATORY_CARE_PROVIDER_SITE_OTHER): Payer: Self-pay | Admitting: Ophthalmology

## 2019-07-18 ENCOUNTER — Other Ambulatory Visit: Payer: Self-pay

## 2019-07-18 ENCOUNTER — Ambulatory Visit (INDEPENDENT_AMBULATORY_CARE_PROVIDER_SITE_OTHER): Payer: PRIVATE HEALTH INSURANCE | Admitting: Ophthalmology

## 2019-07-18 DIAGNOSIS — H3581 Retinal edema: Secondary | ICD-10-CM

## 2019-07-18 DIAGNOSIS — Z9889 Other specified postprocedural states: Secondary | ICD-10-CM

## 2019-07-18 DIAGNOSIS — H5213 Myopia, bilateral: Secondary | ICD-10-CM

## 2019-07-18 DIAGNOSIS — H40003 Preglaucoma, unspecified, bilateral: Secondary | ICD-10-CM

## 2019-07-18 DIAGNOSIS — H524 Presbyopia: Secondary | ICD-10-CM

## 2019-07-18 DIAGNOSIS — H33313 Horseshoe tear of retina without detachment, bilateral: Secondary | ICD-10-CM

## 2019-07-18 DIAGNOSIS — H3323 Serous retinal detachment, bilateral: Secondary | ICD-10-CM

## 2019-07-18 DIAGNOSIS — H3322 Serous retinal detachment, left eye: Secondary | ICD-10-CM

## 2019-07-18 DIAGNOSIS — H4313 Vitreous hemorrhage, bilateral: Secondary | ICD-10-CM

## 2019-07-18 DIAGNOSIS — H52203 Unspecified astigmatism, bilateral: Secondary | ICD-10-CM

## 2019-07-18 DIAGNOSIS — H25813 Combined forms of age-related cataract, bilateral: Secondary | ICD-10-CM

## 2019-07-18 DIAGNOSIS — H35413 Lattice degeneration of retina, bilateral: Secondary | ICD-10-CM

## 2019-07-27 NOTE — Progress Notes (Addendum)
[facility] - Clinic Note  07/28/2019     CHIEF COMPLAINT Patient presents for Retina Follow Up   HISTORY OF PRESENT ILLNESS: Bradley HolmCharles E Kangas is a 58 y.o. male who presents to the clinic today for:   HPI    Retina Follow Up    Patient presents with  Retinal Break/Detachment.  In both eyes.  This started weeks ago.  Severity is moderate.  Duration of weeks.  I, the attending physician,  performed the HPI with the patient and updated documentation appropriately.          Comments    Retinal tear OU follow up Patient states 2 days ago, his vision OD was great but slowly got a little worse again.  Patient denies eye pain or discomfort and denies any new or worsening floaters or fol OU.       Last edited by Rennis ChrisZamora, Nykeem Citro, MD on 07/28/2019  1:02 PM. (History)    pt states about 2 days ago his eyes seemed to clear up, he states yesterday was a bad day for his vision and today he is back to average, he states overall it has gotten better, pt states he is using brimonidine BID, he states he has not seen any more flashes of light   Referring physician: [name], [name], MD [address]ST FLOOR [place] [id number]  HISTORICAL INFORMATION:   Selected notes from the MEDICAL RECORD NUMBER Referred by Dr. Maris Bergerhristine McCuen for concern of retinal tear OS LEE: 06.10.20 (C. McCuen) [BCVA: OD: 20/40 OS: 20/25] Ocular Hx- PMH-glaucoma suspect, lattice OU, NS OU, myopia, h/o RK OU   CURRENT MEDICATIONS: Current Outpatient Medications (Ophthalmic Drugs)  Medication Sig  . brimonidine (ALPHAGAN) 0.2 % ophthalmic solution Place 1 drop into both eyes 3 (three) times daily.  . prednisoLONE acetate (PRED FORTE) 1 % ophthalmic suspension Place 1 drop into the right eye 4 (four) times daily.   No current facility-administered medications for this visit.  (Ophthalmic Drugs)   Current Outpatient Medications (Other)  Medication Sig  . aspirin 81 MG EC tablet Take 1  tablet (81 mg total) by mouth daily.  . cyclobenzaprine (FLEXERIL) 10 MG tablet Take 1 tablet (10 mg total) by mouth 2 (two) times daily as needed for up to 20 doses for muscle spasms.  . Diclofenac Sodium 2 % SOLN Place 2 g onto the skin 2 (two) times daily.  Marland Kitchen. doxycycline (VIBRA-TABS) 100 MG tablet Take 1 tablet (100 mg total) by mouth 2 (two) times daily.  . hydrOXYzine (ATARAX/VISTARIL) 10 MG tablet Take 30-60 mg by mouth daily as needed for itching.   Marland Kitchen. ibuprofen (ADVIL,MOTRIN) 800 MG tablet Take 1 tablet (800 mg total) by mouth every 8 (eight) hours as needed for up to 30 doses.  Marland Kitchen. omeprazole (PRILOSEC) 20 MG capsule Take 20 mg by mouth daily as needed (heartburn).   . tacrolimus (PROTOPIC) 0.1 % ointment APP EXT TO THE SKIN BID  . triamcinolone ointment (KENALOG) 0.1 % APPLY ON THE SKIN TWICE A DAY AS NEEDED   No current facility-administered medications for this visit.  (Other)      REVIEW OF SYSTEMS: ROS    Positive for: Gastrointestinal, Cardiovascular, Eyes   Negative for: Constitutional, Neurological, Skin, Genitourinary, Musculoskeletal, HENT, Endocrine, Respiratory, Psychiatric, Allergic/Imm, Heme/Lymph   Last edited by Corrinne EagleEnglish, Ashley L on 07/28/2019 10:08 AM. (History)       ALLERGIES Allergies  Allergen Reactions  . Penicillins Anxiety and  Other (See Comments)    "Made me feel like gravity had increased and it was pulling me down" (10/27/2012) Did it involve swelling of the face/tongue/throat, SOB, or low BP? No Did it involve sudden or severe rash/hives, skin peeling, or any reaction on the inside of your mouth or nose? No Did you need to seek medical attention at a hospital or doctor's office? No When did it last happen?Over 10 years ago If all above answers are "NO", may proceed with cephalosporin use.  Marland Kitchen. Lisinopril Cough    PAST MEDICAL HISTORY Past Medical History:  Diagnosis Date  . Carotid stenosis    Pre-CABG Dopplers: RICA 40-59%;  Carotid US (05/2014):   R 1-39%; normal LICA  . Cataract    OU  . Childhood asthma   . Cluster headaches 1985   Mid 80's  . Coronary artery disease    a.  LHC 10/27/12 demonstrated pLAD 70%, AV CFX occluded, pOM 80%, dCFX collateralized the LAD and RCA, RCA without obstructive lesions, EF 50% with inferobasal HK;  b.  Echo 10/30/12: EF 55-60%, grade 1 diastolic dysfunction, mild LAE, normal LV wall motion;  c. s/p CABG 10/31/12 with Dr. Dorris FetchHendrickson:  L-LAD, left radial-OM;   b.  ETT-Nuc (05/2014):  + ECG changes, no ischemia; low risk  . Diverticulitis 02-2012   CT scan   . Fatty liver 02-2012   Mild- CT scan   . GERD (gastroesophageal reflux disease)   . HLD (hyperlipidemia)   . Hx of cardiovascular stress test    ETT-Myovew (05/2014):  + ECG changes with down-sloping ST depression; images with apical thinning but no ischemia, EF 52%; Low Risk  . Iron deficiency anemia 1980's   Past Surgical History:  Procedure Laterality Date  . CARDIAC CATHETERIZATION  10/27/2012  . CORONARY ARTERY BYPASS GRAFT  10/31/2012   Procedure: CORONARY ARTERY BYPASS GRAFTING (CABG);  Surgeon: Loreli SlotSteven C Hendrickson, MD;  [facility];  Service: Open Heart Surgery;  Laterality: N/A;  . LEFT HEART CATHETERIZATION WITH CORONARY ANGIOGRAM N/A 10/27/2012   Procedure: LEFT HEART CATHETERIZATION WITH CORONARY ANGIOGRAM;  Surgeon: Herby Abrahamhomas D Stuckey, MD;  [facility];  Service: Cardiovascular;  Laterality: N/A;  . RADIAL ARTERY HARVEST  10/31/2012   Procedure: RADIAL ARTERY HARVEST;  Surgeon: Loreli SlotSteven C Hendrickson, MD;  [facility];  Service: Open Heart Surgery;  Laterality: Left;  . RADIAL KERATOTOMY  1985   bilaterally    FAMILY HISTORY Family History  Problem Relation Age of Onset  . Lung cancer Father   . Diabetes Mellitus II Paternal Grandmother     SOCIAL HISTORY Social History   Tobacco Use  . Smoking status: Former Smoker    Packs/day: 0.12    Years: 1.50    Pack years: 0.18  . Smokeless tobacco: Never Used   Substance Use Topics  . Alcohol use: Yes    Comment: occ  . Drug use: No    Types: "Crack" cocaine         OPHTHALMIC EXAM:  Base Eye Exam    Visual Acuity (Snellen - Linear)      Right Left   Dist cc 20/80 -2 20/20   Dist ph cc 20/60 -2    Correction: Glasses       Tonometry (Tonopen, 10:13 AM)      Right Left   Pressure 19 19       Pupils      Dark Light Shape React APD   Right 3 2  Round Brisk 0   Left 3 2 Round Brisk 0       Visual Fields      Left Right    Full Full       Extraocular Movement      Right Left    Full Full       Neuro/Psych    Oriented x3: Yes   Mood/Affect: Normal       Dilation    Both eyes: 1.0% Mydriacyl, 2.5% Phenylephrine @ 10:13 AM        Slit Lamp and Fundus Exam    Slit Lamp Exam      Right Left   Lids/Lashes Dermatochalasis - mild MGD Dermatochalasis - upper lid   Conjunctiva/Sclera Melanosis Melanosis, trace temporal Pinguecula   Cornea 8 RK scars, mild Arcus 8 RK scars, mild Arcus   Anterior Chamber Deep and clear Deep and quiet   Iris Round and dilated Round and dilated   Lens 2+ Nuclear sclerosis, 2+ Cortical cataract 2+ Nuclear sclerosis, 2-3+ Cortical cataract   Vitreous Vitreous syneresis, Posterior vitreous detachment, vitreous condensations, diffuse VH consolidating/clearing centrally and settling inferiorly +RBC Vitreous syneresis, +pigment, Posterior vitreous detachment, blood stained vitreous condensations centrally-clearing peripherally and settling inferiorly       Fundus Exam      Right Left   Disc Sharp rim, +cupping, mild pallor, Peripapillary atrophy Pink, sharp.  +cupping.   C/D Ratio 0.8 0.65   Macula Very hazy view, flat, Blunted foveal reflex, trace Epiretinal membrane, RPE mottling and clumping, No heme or edema hazy view, grossly flat, trace ERM, no heme or edema   Vessels Vascular attenuation, mild tortuosity Vascular attenuation, Copper wiring   Periphery Hazy view temporally; Attached,  pigmented lattice at 1200, 0100, HST with bridging vessel and heme from 0130-0200, pigmented paving stone from 0600-0730, pigmented CR scar and lattice at 1030 - light laser changes, small HST at 1045 -- light laser changes, laser changes around superior lesions Retinal break at 1200, HST from 0100-0130 with cuff of +SRF and +lattice within flap, round holes at 0200 with +SRF, pigmented CR scar at 0200 just anterior to holes, pigmented paving stone inferiorly -- good laser surrounding all lesions        Refraction    Wearing Rx      Sphere Cylinder Axis Add   Right -8.00 +1.50 168 +2.00   Left -5.50 +1.00 036 +2.00   Type: PAL          IMAGING AND PROCEDURES  Imaging and Procedures for @TODAY @  OCT, Retina - OU - Both Eyes       Right Eye Quality was good. Central Foveal Thickness: 252. Progression has been stable. Findings include normal foveal contour, no SRF, no IRF, myopic contour (Mild interval decrease in vitreous opacities, prominent floaters centrally).   Left Eye Quality was good. Central Foveal Thickness: 272. Progression has been stable. Findings include normal foveal contour, no IRF, no SRF, myopic contour (interval improvement in vitreous opacities).   Notes *Images captured and stored on drive  Diagnosis / Impression:  NFP, no IRF/SRF OU Myopic contour OU interval improvement in vitreous opacities OU   Clinical management:  See below  Abbreviations: NFP - Normal foveal profile. CME - cystoid macular edema. PED - pigment epithelial detachment. IRF - intraretinal fluid. SRF - subretinal fluid. EZ - ellipsoid zone. ERM - epiretinal membrane. ORA - outer retinal atrophy. ORT - outer retinal tubulation. SRHM - subretinal hyper-reflective material  LASER PROCEDURE NOTE  Procedure:     Barrier laser retinopexy using slit lamp laser, RIGHT eye -- touch up  Diagnosis:      Retinal tears, RIGHT eye              Surgeon:        Bernarda Caffey, MD, PhD  Anesthesia:    Topical  Informed consent obtained, operative eye marked, and time out performed prior to initiation of laser.  Laser settings: Lumenis Smart532 laser, slit lamp Lens: Mainster PRP 165 Power: 380 mW Spot size: 200 microns Duration: 50 msec # spots: 267  Placement of laser: Using a Mainster PRP 165 contact lens at the slit lamp, touch up laser was placed in 3+ confluent rows to fill in around the retinal tear and patch of lattice at 1045 and 1030, respectively and the superonasal tear at 130-200.  Complications: None.  Patient tolerated the procedure well and received written and verbal post-procedure care information/education.        ASSESSMENT/PLAN:    ICD-10-CM   1. Retinal tear of both eyes  H33.313   2. Bilateral retinal detachment  H33.23   3. Lattice degeneration of both retinas  H35.413   4. Vitreous hemorrhage of both eyes ([place])  H43.13   5. Retinal edema  H35.81 OCT, Retina - OU - Both Eyes  6. 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 History of radial keratotomy  Z98.890   8. Combined forms of age-related cataract of both eyes  H25.813   9. Glaucoma suspect of both eyes  H40.003   10. Left retinal detachment  H33.22     1-5. Retinal tears with focal retinal detachments and vitreous hemorrhage OU          Lattice degeneration w/ atrophic holes OU  - s/p laser retinopexy OD (6.26.20) to large superonasal HST and superior patches of lattice, fill-in (07.01.20) to HST at 1045 -- light laser changes in ST quad and improved view with more clearance of VH  - s/p laser retinopexy OS (06.10.20) and touch up laser with Dr. Zigmund Daniel 6.19.2020 -- good laser surrounding lesions OS (HST at 0130 with cuff of +SRF and +lattice within flap, scattered patches of lattice degeneration)  - with improving VH and improved peripheral view, recommend laser retinopexy touch up OD today, 08.07.20  - pt  wishes to proceed  - RBA of procedure discussed, questions answered  - informed consent obtained and signed  - see procedure note  - restart PF QID OD x7 d  - both eyes with diffuse vitreous hemorrhages 2/2 RTs w/ bridging vessels, vitreous hemes improving OU and improving posterior views  - discussed importance of VH precautions  -- minimize activities, keep head elevated, avoid ASA/NSAIDs/blood thinners as able  - f/u 2 weeks  6,7. History of high myopia s/p 8-cut Radial Keratotomy OU  - discussed increased risk of lattice degeneration, RT, RD in high myopes  - stable  - monitor  8. 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  - discussed diagnosis and progression  - approaching visual significance -- OD > OS  - under the expert management of Dr. Ellie Lunch -- will be cleared for cataract surgery ~6 wks after laser retinopexy and after clearance of vitreous hemorrhages  9. Glaucoma Suspect OU  - IOP 19 OU  - +  cupping  - +family history of glaucoma  - expert management per Dr. Charlotte SanesMcCuen -- brimonidine TID OU   Ophthalmic Meds Ordered this visit:  Meds ordered this encounter  Medications  . brimonidine (ALPHAGAN) 0.2 % ophthalmic solution    Sig: Place 1 drop into both eyes 3 (three) times daily.    Dispense:  10 mL    Refill:  10       Return in about 2 weeks (around 08/11/2019) for f/u vh od, DFE, OCT.  There are no Patient Instructions on file for this visit.   Explained the diagnoses, plan, and follow up with the patient and they expressed understanding.  Patient expressed understanding of the importance of proper follow up care.   This document serves as a record of services personally performed by Karie ChimeraBrian G. Nader Boys, MD, PhD. It was created on their behalf by Laurian BrimAmanda Brown, OA, an ophthalmic assistant. The creation of this record is the provider's dictation and/or activities during the  visit.    Electronically signed by: Laurian BrimAmanda Brown, OA  08.06.2020 4:53 PM      Karie ChimeraBrian G. Darnisha Vernet, M.D., Ph.D. Diseases & Surgery of the Retina and Vitreous [facility]  I have reviewed the above documentation for accuracy and completeness, and I agree with the above. Karie ChimeraBrian G. Kalib Bhagat, M.D., Ph.D. 07/28/19 4:53 PM    Abbreviations: M myopia (nearsighted); A astigmatism; H hyperopia (farsighted); P presbyopia; Mrx spectacle prescription;  CTL contact lenses; OD right eye; OS left eye; OU both eyes  XT exotropia; ET esotropia; PEK punctate epithelial keratitis; PEE punctate epithelial erosions; DES dry eye syndrome; MGD meibomian gland dysfunction; ATs artificial tears; PFAT's preservative free artificial tears; NSC nuclear sclerotic cataract; PSC posterior subcapsular cataract; ERM epi-retinal membrane; PVD posterior vitreous detachment; RD retinal detachment; DM diabetes mellitus; DR diabetic retinopathy; NPDR non-proliferative diabetic retinopathy; PDR proliferative diabetic retinopathy; CSME clinically significant macular edema; DME diabetic macular edema; dbh dot blot hemorrhages; CWS cotton wool spot; POAG primary open angle glaucoma; C/D cup-to-disc ratio; HVF humphrey visual field; GVF goldmann visual field; OCT optical coherence tomography; IOP intraocular pressure; BRVO Branch retinal vein occlusion; CRVO central retinal vein occlusion; CRAO central retinal artery occlusion; BRAO branch retinal artery occlusion; RT retinal tear; SB scleral buckle; PPV pars plana vitrectomy; VH Vitreous hemorrhage; PRP panretinal laser photocoagulation; IVK intravitreal kenalog; VMT vitreomacular traction; MH Macular hole;  NVD neovascularization of the disc; NVE neovascularization elsewhere; AREDS age related eye disease study; ARMD age related macular degeneration; POAG primary open angle glaucoma; EBMD epithelial/anterior basement membrane dystrophy; ACIOL anterior chamber intraocular lens;  IOL intraocular lens; PCIOL posterior chamber intraocular lens; Phaco/IOL phacoemulsification with intraocular lens placement; PRK photorefractive keratectomy; LASIK laser assisted in situ keratomileusis; HTN hypertension; DM diabetes mellitus; COPD chronic obstructive pulmonary disease

## 2019-07-28 ENCOUNTER — Other Ambulatory Visit: Payer: Self-pay

## 2019-07-28 ENCOUNTER — Ambulatory Visit (INDEPENDENT_AMBULATORY_CARE_PROVIDER_SITE_OTHER): Payer: PRIVATE HEALTH INSURANCE | Admitting: Ophthalmology

## 2019-07-28 ENCOUNTER — Encounter (INDEPENDENT_AMBULATORY_CARE_PROVIDER_SITE_OTHER): Payer: Self-pay | Admitting: Ophthalmology

## 2019-07-28 DIAGNOSIS — H35413 Lattice degeneration of retina, bilateral: Secondary | ICD-10-CM

## 2019-07-28 DIAGNOSIS — H3323 Serous retinal detachment, bilateral: Secondary | ICD-10-CM

## 2019-07-28 DIAGNOSIS — H40003 Preglaucoma, unspecified, bilateral: Secondary | ICD-10-CM

## 2019-07-28 DIAGNOSIS — H5213 Myopia, bilateral: Secondary | ICD-10-CM

## 2019-07-28 DIAGNOSIS — H4313 Vitreous hemorrhage, bilateral: Secondary | ICD-10-CM

## 2019-07-28 DIAGNOSIS — H25813 Combined forms of age-related cataract, bilateral: Secondary | ICD-10-CM

## 2019-07-28 DIAGNOSIS — H3581 Retinal edema: Secondary | ICD-10-CM | POA: Diagnosis not present

## 2019-07-28 DIAGNOSIS — H3322 Serous retinal detachment, left eye: Secondary | ICD-10-CM

## 2019-07-28 DIAGNOSIS — H524 Presbyopia: Secondary | ICD-10-CM

## 2019-07-28 DIAGNOSIS — H52203 Unspecified astigmatism, bilateral: Secondary | ICD-10-CM

## 2019-07-28 DIAGNOSIS — H33313 Horseshoe tear of retina without detachment, bilateral: Secondary | ICD-10-CM

## 2019-07-28 DIAGNOSIS — Z9889 Other specified postprocedural states: Secondary | ICD-10-CM

## 2019-07-28 MED ORDER — BRIMONIDINE TARTRATE 0.2 % OP SOLN: 1.0000 [drp] | Freq: Three times a day (TID) | OPHTHALMIC | 10 refills | Status: DC

## 2019-08-09 NOTE — Progress Notes (Signed)
[facility] - Clinic Note  08/11/2019     CHIEF COMPLAINT Patient presents for Retina Follow Up   HISTORY OF PRESENT ILLNESS: Bradley Watson is a 58 y.o. male who presents to the clinic today for:   HPI    Retina Follow Up    Patient presents with  Retinal Break/Detachment.  In both eyes.  Severity is moderate.  Duration of 2 weeks.  Since onset it is stable.  I, the attending physician,  performed the HPI with the patient and updated documentation appropriately.          Comments    Patient states vision the same OU.No new floaters or flashes.       Last edited by Rennis Chris, MD on 08/11/2019  8:42 AM. (History)    pt states the blood in his eyes is still bothering him, he states he is sleeping with 3 pillows under his head, he states Dr Charlotte Sanes made him an appt for December to have his cataracts removed, but told him to call if he is released from care here earlier than that, pt is using brimonidine in both eyes  Referring physician: [name], MD [address] [place],  [place] [address]  HISTORICAL INFORMATION:   Selected notes from the MEDICAL RECORD NUMBER Referred by Dr. Maris Berger for concern of retinal tear OS LEE: 06.10.20 (C. McCuen) [BCVA: OD: 20/40 OS: 20/25] Ocular Hx- PMH-glaucoma suspect, lattice OU, NS OU, myopia, h/o RK OU   CURRENT MEDICATIONS: Current Outpatient Medications (Ophthalmic Drugs)  Medication Sig  . brimonidine (ALPHAGAN) 0.2 % ophthalmic solution Place 1 drop into both eyes 3 (three) times daily.  . prednisoLONE acetate (PRED FORTE) 1 % ophthalmic suspension Place 1 drop into the right eye 4 (four) times daily.  Marland Kitchen neomycin-polymyxin-dexameth (MAXITROL) 0.1 % OINT Place 1 application into the right eye 4 (four) times daily.   No current facility-administered medications for this visit.  (Ophthalmic Drugs)   Current Outpatient Medications (Other)  Medication Sig  . aspirin 81 MG EC tablet  Take 1 tablet (81 mg total) by mouth daily.  . cyclobenzaprine (FLEXERIL) 10 MG tablet Take 1 tablet (10 mg total) by mouth 2 (two) times daily as needed for up to 20 doses for muscle spasms.  . Diclofenac Sodium 2 % SOLN Place 2 g onto the skin 2 (two) times daily.  Marland Kitchen doxycycline (VIBRA-TABS) 100 MG tablet Take 1 tablet (100 mg total) by mouth 2 (two) times daily.  . hydrOXYzine (ATARAX/VISTARIL) 10 MG tablet Take 30-60 mg by mouth daily as needed for itching.   Marland Kitchen ibuprofen (ADVIL,MOTRIN) 800 MG tablet Take 1 tablet (800 mg total) by mouth every 8 (eight) hours as needed for up to 30 doses.  Marland Kitchen omeprazole (PRILOSEC) 20 MG capsule Take 20 mg by mouth daily as needed (heartburn).   . tacrolimus (PROTOPIC) 0.1 % ointment APP EXT TO THE SKIN BID  . triamcinolone ointment (KENALOG) 0.1 % APPLY ON THE SKIN TWICE A DAY AS NEEDED   No current facility-administered medications for this visit.  (Other)      REVIEW OF SYSTEMS: ROS    Positive for: Gastrointestinal, Cardiovascular, Eyes   Negative for: Constitutional, Neurological, Skin, Genitourinary, Musculoskeletal, HENT, Endocrine, Respiratory, Psychiatric, Allergic/Imm, Heme/Lymph   Last edited by Annalee Genta D on 08/11/2019  8:11 AM. (History)       ALLERGIES Allergies  Allergen Reactions  . Penicillins Anxiety and Other (See Comments)    "  Made me feel like gravity had increased and it was pulling me down" (10/27/2012) Did it involve swelling of the face/tongue/throat, SOB, or low BP? No Did it involve sudden or severe rash/hives, skin peeling, or any reaction on the inside of your mouth or nose? No Did you need to seek medical attention at a hospital or doctor's office? No When did it last happen?Over 10 years ago If all above answers are "NO", may proceed with cephalosporin use.  Marland Kitchen. Lisinopril Cough    PAST MEDICAL HISTORY Past Medical History:  Diagnosis Date  . Carotid stenosis    Pre-CABG Dopplers: RICA 40-59%;  Carotid US  (05/2014):  R 1-39%; normal LICA  . Cataract    OU  . Childhood asthma   . Cluster headaches 1985   Mid 80's  . Coronary artery disease    a.  LHC 10/27/12 demonstrated pLAD 70%, AV CFX occluded, pOM 80%, dCFX collateralized the LAD and RCA, RCA without obstructive lesions, EF 50% with inferobasal HK;  b.  Echo 10/30/12: EF 55-60%, grade 1 diastolic dysfunction, mild LAE, normal LV wall motion;  c. s/p CABG 10/31/12 with Dr. Dorris FetchHendrickson:  L-LAD, left radial-OM;   b.  ETT-Nuc (05/2014):  + ECG changes, no ischemia; low risk  . Diverticulitis 02-2012   CT scan   . Fatty liver 02-2012   Mild- CT scan   . GERD (gastroesophageal reflux disease)   . HLD (hyperlipidemia)   . Hx of cardiovascular stress test    ETT-Myovew (05/2014):  + ECG changes with down-sloping ST depression; images with apical thinning but no ischemia, EF 52%; Low Risk  . Iron deficiency anemia 1980's   Past Surgical History:  Procedure Laterality Date  . CARDIAC CATHETERIZATION  10/27/2012  . CORONARY ARTERY BYPASS GRAFT  10/31/2012   Procedure: CORONARY ARTERY BYPASS GRAFTING (CABG);  Surgeon: Loreli SlotSteven C Hendrickson, MD;  [facility];  Service: Open Heart Surgery;  Laterality: N/A;  . LEFT HEART CATHETERIZATION WITH CORONARY ANGIOGRAM N/A 10/27/2012   Procedure: LEFT HEART CATHETERIZATION WITH CORONARY ANGIOGRAM;  Surgeon: Herby Abrahamhomas D Stuckey, MD;  [facility];  Service: Cardiovascular;  Laterality: N/A;  . RADIAL ARTERY HARVEST  10/31/2012   Procedure: RADIAL ARTERY HARVEST;  Surgeon: Loreli SlotSteven C Hendrickson, MD;  [facility];  Service: Open Heart Surgery;  Laterality: Left;  . RADIAL KERATOTOMY  1985   bilaterally    FAMILY HISTORY Family History  Problem Relation Age of Onset  . Lung cancer Father   . Diabetes Mellitus II Paternal Grandmother     SOCIAL HISTORY Social History   Tobacco Use  . Smoking status: Former Smoker    Packs/day: 0.12    Years: 1.50    Pack years: 0.18  . Smokeless tobacco:  Never Used  Substance Use Topics  . Alcohol use: Yes    Comment: occ  . Drug use: No    Types: "Crack" cocaine         OPHTHALMIC EXAM:  Base Eye Exam    Visual Acuity (Snellen - Linear)      Right Left   Dist cc 20/150 +1 20/20 -2   Dist ph cc 20/40 -2    Correction: Glasses       Tonometry (Tonopen, 8:19 AM)      Right Left   Pressure 19 16       Pupils      Dark Light Shape React APD   Right 4 3 Round Brisk None  Left 4 3 Round Brisk None       Visual Fields (Counting fingers)      Left Right    Full Full       Extraocular Movement      Right Left    Full, Ortho Full, Ortho       Neuro/Psych    Oriented x3: Yes   Mood/Affect: Normal       Dilation    Both eyes: 1.0% Mydriacyl, 2.5% Phenylephrine @ 8:19 AM        Slit Lamp and Fundus Exam    Slit Lamp Exam      Right Left   Lids/Lashes Dermatochalasis - mild MGD Dermatochalasis - upper lid   Conjunctiva/Sclera Melanosis Melanosis, trace temporal Pinguecula   Cornea 8 RK scars, mild Arcus 8 RK scars, mild Arcus   Anterior Chamber Deep and clear Deep and quiet   Iris Round and dilated Round and dilated   Lens 2+ Nuclear sclerosis, 2+ Cortical cataract 2+ Nuclear sclerosis, 2-3+ Cortical cataract   Vitreous Vitreous syneresis, Posterior vitreous detachment, vitreous condensations, diffuse VH consolidating/clearing centrally and settling inferiorly +RBC Vitreous syneresis, +pigment, Posterior vitreous detachment, blood stained vitreous condensations centrally-clearing peripherally and settling inferiorly       Fundus Exam      Right Left   Disc Sharp rim, +cupping, mild pallor, Peripapillary atrophy Pink, sharp.  +cupping.   C/D Ratio 0.8 0.65   Macula Very hazy view, flat, Blunted foveal reflex, trace Epiretinal membrane, RPE mottling and clumping, No heme or edema hazy view, grossly flat, trace ERM, no heme or edema   Vessels Vascular attenuation, mild tortuosity Vascular attenuation, Copper  wiring   Periphery Hazy view temporally; Attached, pigmented lattice at 1200, 0100, HST with bridging vessel and active heme from 0130-0200, pigmented paving stone from 0600-0730, pigmented CR scar and lattice at 1030, small HST at 1045 -- good laser surrounding all lesions Retinal break at 1200, HST from 0100-0130 with cuff of +SRF and +lattice within flap, round holes at 0200 with +SRF, pigmented CR scar at 0200 just anterior to holes, pigmented paving stone inferiorly -- good laser surrounding all lesions          IMAGING AND PROCEDURES  Imaging and Procedures for @TODAY @  OCT, Retina - OU - Both Eyes       Right Eye Quality was good. Central Foveal Thickness: 247. Progression has been stable. Findings include normal foveal contour, no SRF, no IRF, myopic contour (Mild interval decrease in vitreous opacities, prominent floaters centrally).   Left Eye Quality was good. Central Foveal Thickness: 272. Progression has been stable. Findings include normal foveal contour, no IRF, no SRF, myopic contour (interval improvement in vitreous opacities).   Notes *Images captured and stored on drive  Diagnosis / Impression:  NFP, no IRF/SRF OU Myopic contour OU interval improvement in vitreous opacities OU   Clinical management:  See below  Abbreviations: NFP - Normal foveal profile. CME - cystoid macular edema. PED - pigment epithelial detachment. IRF - intraretinal fluid. SRF - subretinal fluid. EZ - ellipsoid zone. ERM - epiretinal membrane. ORA - outer retinal atrophy. ORT - outer retinal tubulation. SRHM - subretinal hyper-reflective material        Repair Retinal Breaks, Cryotherapy - OD - Right Eye       CRYOPEXY PROCEDURE NOTE  Preoperative Diagnosis:       Retinal Tear with bridging vessels, RIGHT EYE Postoperative Diagnosis:     Same  Surgeon:  Bernarda Caffey, M.D./Ph.D. Assistant:   Zenovia Jordan, L.P.N. Anesthesia Type:            Subconjunctival lidocaine  injection   The patient's RIGHT eye was marked and a timeout was performed to verify the correct patient, the correct site, and correct procedure. One of drop of Proparacaine was given in the operative eye. A local subconjunctival block was performed utilizing 2% Lidocaine, using a 30 gauge needle from 11-4 clock hours. An eyelid speculum was inserted into the operative eye. Using indirect ophthalmoloscopy, the retinal tears/breaks with bridging vessels were identified. Cryotherapy was appropriately utilized in the areas of retinal tears/breaks at the 130 clock hours.  A small amount of Tobradex ointment was instilled to the operative eye and the eye was patched.  The patient was given a prescription for Maxitrol ointment to be used QID in the operative eye until the next visit.                ASSESSMENT/PLAN:    ICD-10-CM   1. Retinal tear of both eyes  H33.313 Repair Retinal Breaks, Cryotherapy - OD - Right Eye  2. Bilateral retinal detachment  H33.23 Repair Retinal Breaks, Cryotherapy - OD - Right Eye  3. Lattice degeneration of both retinas  H35.413   4. Vitreous hemorrhage of both eyes ([place])  H43.13   5. Retinal edema  H35.81 OCT, Retina - OU - Both Eyes  6. 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 History of radial keratotomy  Z98.890   8. Combined forms of age-related cataract of both eyes  H25.813   9. Glaucoma suspect of both eyes  H40.003     1-5. Retinal tears with focal retinal detachments and vitreous hemorrhage OU          Lattice degeneration w/ atrophic holes OU  - s/p laser retinopexy OD (6.26.20) to large superonasal HST and superior patches of lattice, fill-in (07.01.20) to HST at 1045, fill-in (08.07.20) -- good laser surrounding all lesions  - s/p laser retinopexy OS (06.10.20) and touch up laser with Dr. Zigmund Daniel 6.19.2020 -- good laser surrounding lesions OS (HST at 0130 with cuff of +SRF and +lattice within flap,  scattered patches of lattice degeneration)  - both eyes with diffuse vitreous hemorrhages 2/2 RTs w/ bridging vessels, vitreous hemes improving OS -- OD with significant VH and visible heme extruding from bridging vessels within large superonasal retinal tear  - discussed findings and treatment options  - recommend cryopexy OD today (08.21.20), due to active heme from bridging vessels  - pt wishes to proceed  - RBA of procedure discussed, questions answered  - informed consent obtained and signed  - see procedure note  - remove patch later this evening  - start maxitrol ung QID OD tonight  - discussed importance of VH precautions  -- minimize activities, keep head elevated, avoid ASA/NSAIDs/blood thinners as able  - f/u 2 weeks  6,7. History of high myopia s/p 8-cut Radial Keratotomy OU  - discussed increased risk of lattice degeneration, RT, RD in high myopes  - stable  - monitor  8. 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  - discussed diagnosis and progression  - approaching visual significance -- OD > OS  - under the expert management of Dr. Ellie Lunch -- will be cleared for cataract surgery ~6 wks  after laser retinopexy and after clearance of vitreous hemorrhages  9. Glaucoma Suspect OU  - IOP 19 OD, 16 OS  - +cupping  - +family history of glaucoma  - expert management per Dr. Charlotte SanesMcCuen -- brimonidine TID OU   Ophthalmic Meds Ordered this visit:  Meds ordered this encounter  Medications  . neomycin-polymyxin-dexameth (MAXITROL) 0.1 % OINT    Sig: Place 1 application into the right eye 4 (four) times daily.    Dispense:  3.5 g    Refill:  3       Return in about 2 weeks (around 08/25/2019) for POV.  There are no Patient Instructions on file for this visit.   Explained the diagnoses, plan, and follow up with the patient and they expressed understanding.  Patient expressed  understanding of the importance of proper follow up care.   This document serves as a record of services personally performed by Karie ChimeraBrian G. Jeanee Fabre, MD, PhD. It was created on their behalf by Annalee Gentaaryl Barber, COMT. The creation of this record is the provider's dictation and/or activities during the visit.  Electronically signed by: Annalee Gentaaryl Barber, COMT 08/11/19 4:58 PM   Karie ChimeraBrian G. Delesa Kawa, M.D., Ph.D. Diseases & Surgery of the Retina and Vitreous [facility]  I have reviewed the above documentation for accuracy and completeness, and I agree with the above. Karie ChimeraBrian G. Carrie Usery, M.D., Ph.D. 08/11/19 4:58 PM    Abbreviations: M myopia (nearsighted); A astigmatism; H hyperopia (farsighted); P presbyopia; Mrx spectacle prescription;  CTL contact lenses; OD right eye; OS left eye; OU both eyes  XT exotropia; ET esotropia; PEK punctate epithelial keratitis; PEE punctate epithelial erosions; DES dry eye syndrome; MGD meibomian gland dysfunction; ATs artificial tears; PFAT's preservative free artificial tears; NSC nuclear sclerotic cataract; PSC posterior subcapsular cataract; ERM epi-retinal membrane; PVD posterior vitreous detachment; RD retinal detachment; DM diabetes mellitus; DR diabetic retinopathy; NPDR non-proliferative diabetic retinopathy; PDR proliferative diabetic retinopathy; CSME clinically significant macular edema; DME diabetic macular edema; dbh dot blot hemorrhages; CWS cotton wool spot; POAG primary open angle glaucoma; C/D cup-to-disc ratio; HVF humphrey visual field; GVF goldmann visual field; OCT optical coherence tomography; IOP intraocular pressure; BRVO Branch retinal vein occlusion; CRVO central retinal vein occlusion; CRAO central retinal artery occlusion; BRAO branch retinal artery occlusion; RT retinal tear; SB scleral buckle; PPV pars plana vitrectomy; VH Vitreous hemorrhage; PRP panretinal laser photocoagulation; IVK intravitreal kenalog; VMT vitreomacular traction; MH  Macular hole;  NVD neovascularization of the disc; NVE neovascularization elsewhere; AREDS age related eye disease study; ARMD age related macular degeneration; POAG primary open angle glaucoma; EBMD epithelial/anterior basement membrane dystrophy; ACIOL anterior chamber intraocular lens; IOL intraocular lens; PCIOL posterior chamber intraocular lens; Phaco/IOL phacoemulsification with intraocular lens placement; PRK photorefractive keratectomy; LASIK laser assisted in situ keratomileusis; HTN hypertension; DM diabetes mellitus; COPD chronic obstructive pulmonary disease

## 2019-08-11 ENCOUNTER — Other Ambulatory Visit: Payer: Self-pay

## 2019-08-11 ENCOUNTER — Encounter (INDEPENDENT_AMBULATORY_CARE_PROVIDER_SITE_OTHER): Payer: Self-pay | Admitting: Ophthalmology

## 2019-08-11 ENCOUNTER — Ambulatory Visit (INDEPENDENT_AMBULATORY_CARE_PROVIDER_SITE_OTHER): Payer: PRIVATE HEALTH INSURANCE | Admitting: Ophthalmology

## 2019-08-11 DIAGNOSIS — H5213 Myopia, bilateral: Secondary | ICD-10-CM

## 2019-08-11 DIAGNOSIS — H33313 Horseshoe tear of retina without detachment, bilateral: Secondary | ICD-10-CM | POA: Diagnosis not present

## 2019-08-11 DIAGNOSIS — H35413 Lattice degeneration of retina, bilateral: Secondary | ICD-10-CM

## 2019-08-11 DIAGNOSIS — H40003 Preglaucoma, unspecified, bilateral: Secondary | ICD-10-CM

## 2019-08-11 DIAGNOSIS — Z9889 Other specified postprocedural states: Secondary | ICD-10-CM

## 2019-08-11 DIAGNOSIS — H3581 Retinal edema: Secondary | ICD-10-CM | POA: Diagnosis not present

## 2019-08-11 DIAGNOSIS — H52203 Unspecified astigmatism, bilateral: Secondary | ICD-10-CM

## 2019-08-11 DIAGNOSIS — H524 Presbyopia: Secondary | ICD-10-CM

## 2019-08-11 DIAGNOSIS — H3323 Serous retinal detachment, bilateral: Secondary | ICD-10-CM | POA: Diagnosis not present

## 2019-08-11 DIAGNOSIS — H25813 Combined forms of age-related cataract, bilateral: Secondary | ICD-10-CM

## 2019-08-11 DIAGNOSIS — H4313 Vitreous hemorrhage, bilateral: Secondary | ICD-10-CM

## 2019-08-11 MED ORDER — MAXITROL 3.5-10000-0.1 OP OINT: 1.0000 "application " | TOPICAL_OINTMENT | Freq: Four times a day (QID) | OPHTHALMIC | 3 refills | Status: DC

## 2019-08-13 ENCOUNTER — Emergency Department (HOSPITAL_BASED_OUTPATIENT_CLINIC_OR_DEPARTMENT_OTHER)
Admission: EM | Admit: 2019-08-13 | Discharge: 2019-08-13 | Disposition: A | Discharge status: Home / Self Care | Payer: PRIVATE HEALTH INSURANCE | Attending: Emergency Medicine | Admitting: Emergency Medicine

## 2019-08-13 ENCOUNTER — Encounter (HOSPITAL_BASED_OUTPATIENT_CLINIC_OR_DEPARTMENT_OTHER): Payer: Self-pay | Admitting: Emergency Medicine

## 2019-08-13 ENCOUNTER — Emergency Department (HOSPITAL_BASED_OUTPATIENT_CLINIC_OR_DEPARTMENT_OTHER): Payer: PRIVATE HEALTH INSURANCE

## 2019-08-13 ENCOUNTER — Other Ambulatory Visit: Payer: Self-pay

## 2019-08-13 DIAGNOSIS — R109 Unspecified abdominal pain: Secondary | ICD-10-CM | POA: Diagnosis present

## 2019-08-13 DIAGNOSIS — Z87891 Personal history of nicotine dependence: Secondary | ICD-10-CM | POA: Diagnosis not present

## 2019-08-13 DIAGNOSIS — Z7982 Long term (current) use of aspirin: Secondary | ICD-10-CM | POA: Diagnosis not present

## 2019-08-13 DIAGNOSIS — Z79899 Other long term (current) drug therapy: Secondary | ICD-10-CM | POA: Insufficient documentation

## 2019-08-13 DIAGNOSIS — N201 Calculus of ureter: Secondary | ICD-10-CM | POA: Diagnosis not present

## 2019-08-13 DIAGNOSIS — I2510 Atherosclerotic heart disease of native coronary artery without angina pectoris: Secondary | ICD-10-CM | POA: Diagnosis not present

## 2019-08-13 LAB — URINALYSIS, MICROSCOPIC (REFLEX)

## 2019-08-13 LAB — CBC WITH DIFFERENTIAL/PLATELET
Abs Immature Granulocytes: 0.02 10*3/uL (ref 0.00–0.07)
Basophils Absolute: 0.1 10*3/uL (ref 0.0–0.1)
Basophils Relative: 1 %
Eosinophils Absolute: 0.2 10*3/uL (ref 0.0–0.5)
Eosinophils Relative: 3 %
HCT: 40.6 % (ref 39.0–52.0)
Hemoglobin: 12.7 g/dL — ABNORMAL LOW (ref 13.0–17.0)
Immature Granulocytes: 0 %
Lymphocytes Relative: 20 %
Lymphs Abs: 1.2 10*3/uL (ref 0.7–4.0)
MCH: 30 pg (ref 26.0–34.0)
MCHC: 31.3 g/dL (ref 30.0–36.0)
MCV: 96 fL (ref 80.0–100.0)
Monocytes Absolute: 0.8 10*3/uL (ref 0.1–1.0)
Monocytes Relative: 13 %
Neutro Abs: 3.7 10*3/uL (ref 1.7–7.7)
Neutrophils Relative %: 63 %
Platelets: 202 10*3/uL (ref 150–400)
RBC: 4.23 MIL/uL (ref 4.22–5.81)
RDW: 15.8 % — ABNORMAL HIGH (ref 11.5–15.5)
WBC: 5.9 10*3/uL (ref 4.0–10.5)
nRBC: 0 % (ref 0.0–0.2)

## 2019-08-13 LAB — COMPREHENSIVE METABOLIC PANEL
ALT: 19 U/L (ref 0–44)
AST: 33 U/L (ref 15–41)
Albumin: 3.6 g/dL (ref 3.5–5.0)
Alkaline Phosphatase: 74 U/L (ref 38–126)
Anion gap: 11 (ref 5–15)
BUN: 16 mg/dL (ref 6–20)
CO2: 21 mmol/L — ABNORMAL LOW (ref 22–32)
Calcium: 9 mg/dL (ref 8.9–10.3)
Chloride: 107 mmol/L (ref 98–111)
Creatinine, Ser: 1.2 mg/dL (ref 0.61–1.24)
GFR calc Af Amer: >60 mL/min (ref >60)
GFR calc non Af Amer: >60 mL/min (ref >60)
Glucose, Bld: 110 mg/dL — ABNORMAL HIGH (ref 70–99)
Potassium: 4.3 mmol/L (ref 3.5–5.1)
Sodium: 139 mmol/L (ref 135–145)
Total Bilirubin: 1 mg/dL (ref 0.3–1.2)
Total Protein: 7.4 g/dL (ref 6.5–8.1)

## 2019-08-13 LAB — URINALYSIS, ROUTINE W REFLEX MICROSCOPIC
Bilirubin Urine: NEGATIVE
Glucose, UA: NEGATIVE mg/dL
Ketones, ur: 15 mg/dL — AB
Leukocytes,Ua: NEGATIVE
Nitrite: NEGATIVE
Protein, ur: NEGATIVE mg/dL
Specific Gravity, Urine: 1.015 (ref 1.005–1.030)
pH: 5.5 (ref 5.0–8.0)

## 2019-08-13 MED ORDER — OXYCODONE-ACETAMINOPHEN 5-325 MG PO TABS
1.0000 | ORAL_TABLET | Freq: Once | ORAL | Status: AC
  Administered 2019-08-13: 1 via ORAL
  Filled 2019-08-13: qty 1

## 2019-08-13 MED ORDER — TAMSULOSIN HCL 0.4 MG PO CAPS: 0.4000 mg | ORAL_CAPSULE | Freq: Every day | ORAL | 0 refills | Status: DC

## 2019-08-13 MED ORDER — IOHEXOL 300 MG/ML  SOLN
100.0000 mL | Freq: Once | INTRAMUSCULAR | Status: AC | PRN
  Administered 2019-08-13: 100 mL via INTRAVENOUS

## 2019-08-13 MED ORDER — ONDANSETRON 4 MG PO TBDP: 4.0000 mg | ORAL_TABLET | Freq: Three times a day (TID) | ORAL | 0 refills | Status: DC | PRN

## 2019-08-13 MED ORDER — MORPHINE SULFATE (PF) 4 MG/ML IV SOLN
4.0000 mg | Freq: Once | INTRAVENOUS | Status: AC
  Administered 2019-08-13: 4 mg via INTRAVENOUS
  Filled 2019-08-13: qty 1

## 2019-08-13 MED ORDER — OXYCODONE-ACETAMINOPHEN 5-325 MG PO TABS: 1.0000 | ORAL_TABLET | Freq: Three times a day (TID) | ORAL | 0 refills | Status: DC | PRN

## 2019-08-13 NOTE — ED Triage Notes (Addendum)
RLQ pain with nausea since Friday. He also had eye surgery last week for bilateral retinal detachment.

## 2019-08-13 NOTE — Discharge Instructions (Signed)
Take pain medication, nausea medicine and Flomax as directed. Follow-up with the urologist if your symptoms do not improve in 4 to 5 days. Return to the ED if you develop a fever, worsening pain, blood in your stool, uncontrolled vomiting.

## 2019-08-13 NOTE — ED Notes (Signed)
Patient transported to CT 

## 2019-08-13 NOTE — ED Notes (Signed)
Patient aware of need for urine.  Reports he urinated right before coming here.

## 2019-08-13 NOTE — ED Provider Notes (Signed)
[facility] EMERGENCY DEPARTMENT Provider Note   CSN: [id number] Arrival date & time: 08/13/19  1441     History   Chief Complaint Chief Complaint  Patient presents with  . Abdominal Pain    HPI Bradley Watson is a 58 y.o. male with a past medical history of asthma, CAD, GERD who presents to ED for right lower quadrant pain since 08/11/2019.  States the pain is sharp, no specific aggravating or alleviating factor.  This morning he felt like when he bent forward with his legs apart, the pain somewhat was relieved.  He has not tried any medications to help with the symptoms.  He denies any vomiting but does endorse nausea.  States that he is having trouble eating or drinking much secondary to nausea and pain.  Denies any urinary symptoms, blood in stool, fever, sick contacts with similar symptoms, suspicious food ingestions prior to pain.  States that he is had surgery in the past to "take out some stones, I think they were kidney stones." Denies shortness of breath, chest pain, back pain.     HPI  Past Medical History:  Diagnosis Date  . Carotid stenosis    Pre-CABG Dopplers: RICA 40-59%;  Carotid US (05/2014):  R 1-39%; normal LICA  . Cataract    OU  . Childhood asthma   . Cluster headaches 1985   Mid 80's  . Coronary artery disease    a.  LHC 10/27/12 demonstrated pLAD 70%, AV CFX occluded, pOM 80%, dCFX collateralized the LAD and RCA, RCA without obstructive lesions, EF 50% with inferobasal HK;  b.  Echo 10/30/12: EF 55-60%, grade 1 diastolic dysfunction, mild LAE, normal LV wall motion;  c. s/p CABG 10/31/12 with Dr. Dorris FetchHendrickson:  L-LAD, left radial-OM;   b.  ETT-Nuc (05/2014):  + ECG changes, no ischemia; low risk  . Diverticulitis 02-2012   CT scan   . Fatty liver 02-2012   Mild- CT scan   . GERD (gastroesophageal reflux disease)   . HLD (hyperlipidemia)   . Hx of cardiovascular stress test    ETT-Myovew (05/2014):  + ECG changes with down-sloping ST depression;  images with apical thinning but no ischemia, EF 52%; Low Risk  . Iron deficiency anemia 1980's    Patient Active Problem List   Diagnosis Date Noted  . Tear of MCL (medial collateral ligament) of knee, right, initial encounter 07/21/2018  . Right knee pain 06/28/2018  . Left knee pain 06/28/2018  . Fatigue 04/20/2017  . Cough 03/31/2017  . Prediabetes 09/29/2016  . Routine general medical examination at a health care facility 09/29/2016  . Eczema, allergic 09/29/2016  . Essential hypertension 05/14/2016  . Coronary atherosclerosis of native coronary artery 11/21/2012  . Hyperlipidemia LDL goal <70 10/28/2012  . GERD (gastroesophageal reflux disease) 10/24/2012    Past Surgical History:  Procedure Laterality Date  . CARDIAC CATHETERIZATION  10/27/2012  . CORONARY ARTERY BYPASS GRAFT  10/31/2012   Procedure: CORONARY ARTERY BYPASS GRAFTING (CABG);  Surgeon: Loreli SlotSteven C Hendrickson, MD;  [facility];  Service: Open Heart Surgery;  Laterality: N/A;  . LEFT HEART CATHETERIZATION WITH CORONARY ANGIOGRAM N/A 10/27/2012   Procedure: LEFT HEART CATHETERIZATION WITH CORONARY ANGIOGRAM;  Surgeon: Herby Abrahamhomas D Stuckey, MD;  [facility];  Service: Cardiovascular;  Laterality: N/A;  . RADIAL ARTERY HARVEST  10/31/2012   Procedure: RADIAL ARTERY HARVEST;  Surgeon: Loreli SlotSteven C Hendrickson, MD;  [facility];  Service: Open Heart Surgery;  Laterality: Left;  .  RADIAL KERATOTOMY  1985   bilaterally        Home Medications    Prior to Admission medications   Medication Sig Start Date End Date Taking? Authorizing Provider  aspirin 81 MG EC tablet Take 1 tablet (81 mg total) by mouth daily. 01/13/13   Herby AbrahamStuckey, Thomas D, MD  brimonidine (ALPHAGAN) 0.2 % ophthalmic solution Place 1 drop into both eyes 3 (three) times daily. 07/28/19   Rennis ChrisZamora, Brian, MD  cyclobenzaprine (FLEXERIL) 10 MG tablet Take 1 tablet (10 mg total) by mouth 2 (two) times daily as needed for up to 20 doses for muscle spasms.  02/10/19   Curatolo, Adam, DO  Diclofenac Sodium 2 % SOLN Place 2 g onto the skin 2 (two) times daily. 07/21/18   Judi SaaSmith, Zachary M, DO  doxycycline (VIBRA-TABS) 100 MG tablet Take 1 tablet (100 mg total) by mouth 2 (two) times daily. 11/10/18   Roddie McKordsmeier, Julia, FNP  hydrOXYzine (ATARAX/VISTARIL) 10 MG tablet Take 30-60 mg by mouth daily as needed for itching.  01/04/19   [provider]  ibuprofen (ADVIL,MOTRIN) 800 MG tablet Take 1 tablet (800 mg total) by mouth every 8 (eight) hours as needed for up to 30 doses. 02/10/19   Curatolo, Adam, DO  neomycin-polymyxin-dexameth (MAXITROL) 0.1 % OINT Place 1 application into the right eye 4 (four) times daily. 08/11/19   Rennis ChrisZamora, Brian, MD  omeprazole (PRILOSEC) 20 MG capsule Take 20 mg by mouth daily as needed (heartburn).     [provider]  ondansetron (ZOFRAN ODT) 4 MG disintegrating tablet Take 1 tablet (4 mg total) by mouth every 8 (eight) hours as needed for nausea or vomiting. 08/13/19   Petra Dumler, PA-C  oxyCODONE-acetaminophen (PERCOCET/ROXICET) 5-325 MG tablet Take 1 tablet by mouth every 8 (eight) hours as needed for severe pain. 08/13/19   Ayda Tancredi, PA-C  prednisoLONE acetate (PRED FORTE) 1 % ophthalmic suspension Place 1 drop into the right eye 4 (four) times daily. 06/23/19   Rennis ChrisZamora, Brian, MD  tacrolimus (PROTOPIC) 0.1 % ointment APP EXT TO THE SKIN BID 06/21/19   [provider]  tamsulosin (FLOMAX) 0.4 MG CAPS capsule Take 1 capsule (0.4 mg total) by mouth daily after supper. 08/13/19   Delois Silvester, PA-C  triamcinolone ointment (KENALOG) 0.1 % APPLY ON THE SKIN TWICE A DAY AS NEEDED 05/04/19   [provider]    Family History Family History  Problem Relation Age of Onset  . Lung cancer Father   . Diabetes Mellitus II Paternal Grandmother     Social History Social History   Tobacco Use  . Smoking status: Former Smoker    Packs/day: 0.12    Years: 1.50    Pack years: 0.18  . Smokeless tobacco:  Never Used  Substance Use Topics  . Alcohol use: Yes    Comment: occ  . Drug use: No    Types: "Crack" cocaine     Allergies   Penicillins and Lisinopril   Review of Systems Review of Systems  Constitutional: Negative for appetite change, chills and fever.  HENT: Negative for ear pain, rhinorrhea, sneezing and sore throat.   Eyes: Negative for photophobia and visual disturbance.  Respiratory: Negative for cough, chest tightness, shortness of breath and wheezing.   Cardiovascular: Negative for chest pain and palpitations.  Gastrointestinal: Positive for abdominal pain and nausea. Negative for blood in stool, constipation, diarrhea and vomiting.  Genitourinary: Negative for dysuria, hematuria and urgency.  Musculoskeletal: Negative for myalgias.  Skin: Negative  for rash.  Neurological: Negative for dizziness, weakness and light-headedness.     Physical Exam Updated Vital Signs BP (!) 184/119 (BP Location: Right Arm)   Pulse 71   Temp 98.2 F (36.8 C) (Oral)   Resp 16   Ht 6' (1.829 m)   Wt 127 kg   SpO2 100%   BMI 37.97 kg/m   Physical Exam Vitals signs and nursing note reviewed.  Constitutional:      General: He is not in acute distress.    Appearance: He is well-developed.  HENT:     Head: Normocephalic and atraumatic.     Nose: Nose normal.  Eyes:     General: No scleral icterus.       Left eye: No discharge.     Conjunctiva/sclera: Conjunctivae normal.  Neck:     Musculoskeletal: Normal range of motion and neck supple.  Cardiovascular:     Rate and Rhythm: Normal rate and regular rhythm.     Heart sounds: Normal heart sounds. No murmur. No friction rub. No gallop.   Pulmonary:     Effort: Pulmonary effort is normal. No respiratory distress.     Breath sounds: Normal breath sounds.  Abdominal:     General: Bowel sounds are normal. There is no distension.     Palpations: Abdomen is soft.     Tenderness: There is abdominal tenderness in the right lower  quadrant and suprapubic area. There is no guarding.  Musculoskeletal: Normal range of motion.  Skin:    General: Skin is warm and dry.     Findings: No rash.  Neurological:     Mental Status: He is alert.     Motor: No abnormal muscle tone.     Coordination: Coordination normal.      ED Treatments / Results  Labs (all labs ordered are listed, but only abnormal results are displayed) Labs Reviewed  COMPREHENSIVE METABOLIC PANEL - Abnormal; Notable for the following components:      Result Value   CO2 21 (*)    Glucose, Bld 110 (*)    All other components within normal limits  CBC WITH DIFFERENTIAL/PLATELET - Abnormal; Notable for the following components:   Hemoglobin 12.7 (*)    RDW 15.8 (*)    All other components within normal limits  URINALYSIS, ROUTINE W REFLEX MICROSCOPIC - Abnormal; Notable for the following components:   APPearance CLOUDY (*)    Hgb urine dipstick LARGE (*)    Ketones, ur 15 (*)    All other components within normal limits  URINALYSIS, MICROSCOPIC (REFLEX) - Abnormal; Notable for the following components:   Bacteria, UA FEW (*)    All other components within normal limits  URINE CULTURE    EKG EKG Interpretation  Date/Time:  Sunday August 13 2019 14:59:35 EDT Ventricular Rate:  65 PR Interval:    QRS Duration: 104 QT Interval:  416 QTC Calculation: 433 R Axis:   18 Text Interpretation:  Sinus rhythm Left ventricular hypertrophy No acute changes No significant change since last tracing Confirmed by Derwood Kaplan [phone]) on 08/13/2019 3:26:25 PM   Radiology Ct Abdomen Pelvis W Contrast  Result Date: 08/13/2019 CLINICAL DATA:  Right lower quadrant pain, nausea, vomiting EXAM: CT ABDOMEN AND PELVIS WITH CONTRAST TECHNIQUE: Multidetector CT imaging of the abdomen and pelvis was performed using the standard protocol following bolus administration of intravenous contrast. CONTRAST:  OMNIPAQUE IOHEXOL 300 MG/ML  SOLN COMPARISON:  None.  FINDINGS: Lower chest: No acute abnormality. Hepatobiliary:  Diffuse low-density throughout the liver compatible with fatty infiltration. No focal abnormality. Gallbladder unremarkable. Pancreas: No focal abnormality or ductal dilatation. Spleen: No focal abnormality.  Normal size. Adrenals/Urinary Tract: 4 mm proximal right ureteral stone with moderate right hydronephrosis. 12 mm renal pelvic stone. No stones or hydronephrosis on the left. Adrenal glands and urinary bladder unremarkable. Stomach/Bowel: Sigmoid and descending colonic diverticulosis. No active diverticulitis. Normal appendix. Stomach, small bowel unremarkable. Vascular/Lymphatic: Aortic atherosclerosis. No enlarged abdominal or pelvic lymph nodes. Reproductive: Mildly prominent prostate with central calcifications. Other: No free fluid or free air. Musculoskeletal: No acute bony abnormality. IMPRESSION: 4 mm proximal right ureteral stone with moderate right hydronephrosis. 12 mm right renal pelvic stone. Fatty infiltration of the liver. Left colonic diverticulosis.  No active diverticulitis. Aortic atherosclerosis. Electronically Signed   By: Rolm Baptise M.D.   On: 08/13/2019 16:23    Procedures Procedures (including critical care time)  Medications Ordered in ED Medications  morphine 4 MG/ML injection 4 mg (4 mg Intravenous Given 08/13/19 1519)  iohexol (OMNIPAQUE) 300 MG/ML solution 100 mL (100 mLs Intravenous Contrast Given 08/13/19 1554)  oxyCODONE-acetaminophen (PERCOCET/ROXICET) 5-325 MG per tablet 1 tablet (1 tablet Oral Given 08/13/19 1706)     Initial Impression / Assessment and Plan / ED Course  I have reviewed the triage vital signs and the nursing notes.  Pertinent labs & imaging results that were available during my care of the patient were reviewed by me and considered in my medical decision making (see chart for details).        BLANCA THORNTON was evaluated in Emergency Department on 08/13/19  for the symptoms  described in the history of present illness. He/she was evaluated in the context of the global COVID-19 pandemic, which necessitated consideration that the patient might be at risk for infection with the SARS-CoV-2 virus that causes COVID-19. Institutional protocols and algorithms that pertain to the evaluation of patients at risk for COVID-19 are in a state of rapid change based on information released by regulatory bodies including the CDC and federal and state organizations. These policies and algorithms were followed during the patient's care in the ED.  58 year old male presents to ED for right lower quadrant pain for the past 2 days.  Pain is sharp, no specific aggravating or alleviating factors.  He believes he has a history of kidney stones but is unable to recall what kind of stones.  He denies urinary symptoms, fever,.  On exam there is tenderness palpation of the right lower quadrant and right flank.  His vital signs are within normal limits.  CMP, CBC unremarkable.  Urinalysis with large hemoglobin, few bacteria and sent for culture.  EKG with no changes from prior tracings.  CT of the abdomen pelvis shows a 4 mm proximal right ureteral stone with mild hydronephrosis.  Patient's pain controlled here in the ED and he is resting comfortably.  He is able to tolerate p.o. intake without difficulty.  Will prescribe pain medication, antiemetics for kidney stone as well as urology referral.  We will have him follow-up with PCP as well, return for worsening symptoms.  Patient is hemodynamically stable, in NAD, and able to ambulate in the ED. Evaluation does not show pathology that would require ongoing emergent intervention or inpatient treatment. I explained the diagnosis to the patient. Pain has been managed and has no complaints prior to discharge. Patient is comfortable with above plan and is stable for discharge at this time. All questions were answered prior to  disposition. Strict return precautions  for returning to the ED were discussed. Encouraged follow up with PCP.   An After Visit Summary was printed and given to the patient.   Portions of this note were generated with Scientist, clinical (histocompatibility and immunogenetics)Dragon dictation software. Dictation errors may occur despite best attempts at proofreading.  Final Clinical Impressions(s) / ED Diagnoses   Final diagnoses:  Ureterolithiasis    ED Discharge Orders         Ordered    oxyCODONE-acetaminophen (PERCOCET/ROXICET) 5-325 MG tablet  Every 8 hours PRN     08/13/19 1752    ondansetron (ZOFRAN ODT) 4 MG disintegrating tablet  Every 8 hours PRN     08/13/19 1752    tamsulosin (FLOMAX) 0.4 MG CAPS capsule  Daily after supper     08/13/19 1752           Dietrich PatesKhatri, Jasraj Lappe, PA-C 08/13/19 1753    Derwood KaplanNanavati, Ankit, MD 08/14/19 1749

## 2019-08-15 LAB — URINE CULTURE: Culture: NO GROWTH

## 2019-08-24 NOTE — Progress Notes (Signed)
[facility] - Clinic Note  08/25/2019     CHIEF COMPLAINT Patient presents for Post-op Follow-up   HISTORY OF PRESENT ILLNESS: Bradley Watson is a 58 y.o. male who presents to the clinic today for:   HPI    Post-op Follow-up    In right eye.  Discomfort includes none.  Vision is improved.  I, the attending physician,  performed the HPI with the patient and updated documentation appropriately.          Comments    Patient states he thinks blood is getting better OD but states he still has a lot of visual distortion.  He denies eye pain and denies any new or worse floaters or flashes.       Last edited by Rennis Chris, MD on 08/29/2019  8:46 AM. (History)    pt states feels like there is less blood in his eye  Referring physician: [name], MD [address] [place],  [place] [address]  HISTORICAL INFORMATION:   Selected notes from the MEDICAL RECORD NUMBER Referred by Dr. Maris Berger for concern of retinal tear OS LEE: 06.10.20 (C. McCuen) [BCVA: OD: 20/40 OS: 20/25] Ocular Hx- PMH-glaucoma suspect, lattice OU, NS OU, myopia, h/o RK OU   CURRENT MEDICATIONS: Current Outpatient Medications (Ophthalmic Drugs)  Medication Sig  . brimonidine (ALPHAGAN) 0.2 % ophthalmic solution Place 1 drop into both eyes 3 (three) times daily.  Marland Kitchen neomycin-polymyxin-dexameth (MAXITROL) 0.1 % OINT Place 1 application into the right eye 4 (four) times daily.  . prednisoLONE acetate (PRED FORTE) 1 % ophthalmic suspension Place 1 drop into the right eye 4 (four) times daily.   No current facility-administered medications for this visit.  (Ophthalmic Drugs)   Current Outpatient Medications (Other)  Medication Sig  . aspirin 81 MG EC tablet Take 1 tablet (81 mg total) by mouth daily.  . cyclobenzaprine (FLEXERIL) 10 MG tablet Take 1 tablet (10 mg total) by mouth 2 (two) times daily as needed for up to 20 doses for muscle spasms.  . Diclofenac Sodium 2  % SOLN Place 2 g onto the skin 2 (two) times daily.  Marland Kitchen doxycycline (VIBRA-TABS) 100 MG tablet Take 1 tablet (100 mg total) by mouth 2 (two) times daily.  . hydrOXYzine (ATARAX/VISTARIL) 10 MG tablet Take 30-60 mg by mouth daily as needed for itching.   Marland Kitchen ibuprofen (ADVIL,MOTRIN) 800 MG tablet Take 1 tablet (800 mg total) by mouth every 8 (eight) hours as needed for up to 30 doses.  Marland Kitchen omeprazole (PRILOSEC) 20 MG capsule Take 20 mg by mouth daily as needed (heartburn).   . ondansetron (ZOFRAN ODT) 4 MG disintegrating tablet Take 1 tablet (4 mg total) by mouth every 8 (eight) hours as needed for nausea or vomiting.  Marland Kitchen oxyCODONE-acetaminophen (PERCOCET/ROXICET) 5-325 MG tablet Take 1 tablet by mouth every 8 (eight) hours as needed for severe pain.  Marland Kitchen tacrolimus (PROTOPIC) 0.1 % ointment APP EXT TO THE SKIN BID  . tamsulosin (FLOMAX) 0.4 MG CAPS capsule Take 1 capsule (0.4 mg total) by mouth daily after supper.  . triamcinolone ointment (KENALOG) 0.1 % APPLY ON THE SKIN TWICE A DAY AS NEEDED   No current facility-administered medications for this visit.  (Other)      REVIEW OF SYSTEMS: ROS    Positive for: Gastrointestinal, Cardiovascular, Eyes   Negative for: Constitutional, Neurological, Skin, Genitourinary, Musculoskeletal, HENT, Endocrine, Respiratory, Psychiatric, Allergic/Imm, Heme/Lymph   Last edited by Corrinne Eagle  on 08/25/2019  9:34 AM. (History)       ALLERGIES Allergies  Allergen Reactions  . Penicillins Anxiety and Other (See Comments)    "Made me feel like gravity had increased and it was pulling me down" (10/27/2012) Did it involve swelling of the face/tongue/throat, SOB, or low BP? No Did it involve sudden or severe rash/hives, skin peeling, or any reaction on the inside of your mouth or nose? No Did you need to seek medical attention at a hospital or doctor's office? No When did it last happen?Over 10 years ago If all above answers are "NO", may proceed with  cephalosporin use.  Marland Kitchen Lisinopril Cough    PAST MEDICAL HISTORY Past Medical History:  Diagnosis Date  . Carotid stenosis    Pre-CABG Dopplers: RICA 40-59%;  Carotid US (05/2014):  R 1-39%; normal LICA  . Cataract    OU  . Childhood asthma   . Cluster headaches 1985   Mid 80's  . Coronary artery disease    a.  LHC 10/27/12 demonstrated pLAD 70%, AV CFX occluded, pOM 80%, dCFX collateralized the LAD and RCA, RCA without obstructive lesions, EF 50% with inferobasal HK;  b.  Echo 10/30/12: EF 55-60%, grade 1 diastolic dysfunction, mild LAE, normal LV wall motion;  c. s/p CABG 10/31/12 with Dr. Dorris Fetch:  L-LAD, left radial-OM;   b.  ETT-Nuc (05/2014):  + ECG changes, no ischemia; low risk  . Diverticulitis 02-2012   CT scan   . Fatty liver 02-2012   Mild- CT scan   . GERD (gastroesophageal reflux disease)   . HLD (hyperlipidemia)   . Hx of cardiovascular stress test    ETT-Myovew (05/2014):  + ECG changes with down-sloping ST depression; images with apical thinning but no ischemia, EF 52%; Low Risk  . Iron deficiency anemia 1980's   Past Surgical History:  Procedure Laterality Date  . CARDIAC CATHETERIZATION  10/27/2012  . CORONARY ARTERY BYPASS GRAFT  10/31/2012   Procedure: CORONARY ARTERY BYPASS GRAFTING (CABG);  Surgeon: Loreli Slot, MD;  Location[facility] OR;  Service: Open Heart Surgery;  Laterality: N/A;  . LEFT HEART CATHETERIZATION WITH CORONARY ANGIOGRAM N/A 10/27/2012   Procedure: LEFT HEART CATHETERIZATION WITH CORONARY ANGIOGRAM;  Surgeon: Herby Abraham, MD;  Location: [facility] CATH LAB;  Service: Cardiovascular;  Laterality: N/A;  . RADIAL ARTERY HARVEST  10/31/2012   Procedure: RADIAL ARTERY HARVEST;  Surgeon: Loreli Slot, MD;  Location: [facility] OR;  Service: Open Heart Surgery;  Laterality: Left;  . RADIAL KERATOTOMY  1985   bilaterally    FAMILY HISTORY Family History  Problem Relation Age of Onset  . Lung cancer Father   . Diabetes Mellitus II Paternal  Grandmother     SOCIAL HISTORY Social History   Tobacco Use  . Smoking status: Former Smoker    Packs/day: 0.12    Years: 1.50    Pack years: 0.18  . Smokeless tobacco: Never Used  Substance Use Topics  . Alcohol use: Yes    Comment: occ  . Drug use: No    Types: "Crack" cocaine         OPHTHALMIC EXAM:  Base Eye Exam    Visual Acuity (Snellen - Linear)      Right Left   Dist cc 20/100 -1 20/20 -2   Dist ph cc 20/70 +2    Correction: Glasses       Tonometry (Tonopen, 9:38 AM)      Right Left   Pressure 18  20       Pupils      Dark Light Shape React APD   Right 4 3 Round Brisk 0   Left 4 3 Round Brisk 0       Visual Fields      Left Right    Full Full       Extraocular Movement      Right Left    Full Full       Neuro/Psych    Oriented x3: Yes   Mood/Affect: Normal       Dilation    Both eyes: 1.0% Mydriacyl, 2.5% Phenylephrine @ 9:38 AM        Slit Lamp and Fundus Exam    Slit Lamp Exam      Right Left   Lids/Lashes Dermatochalasis - mild MGD Dermatochalasis - upper lid   Conjunctiva/Sclera Melanosis Melanosis, trace temporal Pinguecula   Cornea 8 RK scars, mild Arcus 8 RK scars, mild Arcus   Anterior Chamber Deep and clear Deep and quiet   Iris Round and dilated Round and dilated   Lens 2+ Nuclear sclerosis, 2+ Cortical cataract 2+ Nuclear sclerosis, 2-3+ Cortical cataract   Vitreous Vitreous syneresis, Posterior vitreous detachment, blood stained vitreous condensations settling inferiorly, diffuse VH consolidating/clearing centrally and settling inferiorly +RBC Vitreous syneresis, +pigment, Posterior vitreous detachment, blood stained vitreous condensations centrally-clearing peripherally and settling inferiorly       Fundus Exam      Right Left   Disc Sharp rim, +cupping, mild pallor, Peripapillary atrophy Pink, sharp.  +cupping.   C/D Ratio 0.8 0.65   Macula Very hazy view, flat, Blunted foveal reflex, trace Epiretinal membrane, RPE  mottling and clumping, No heme or edema hazy view, grossly flat, trace ERM, no heme or edema   Vessels Vascular attenuation, mild tortuosity Vascular attenuation, Copper wiring   Periphery Hazy view temporally; Attached, pigmented lattice at 1200, 0100; HST with bridging vessel 0130-0200 -- good cryo surrounding, ?sclerosing bridging vessels; pigmented paving stone from 0600-0730, pigmented CR scar and lattice at 1030, small HST at 1045 -- good laser surrounding all lesions Retinal break at 1200, HST from 0100-0130 with cuff of +SRF and +lattice within flap, round holes at 0200 with +SRF, pigmented CR scar at 0200 just anterior to holes, pigmented paving stone inferiorly -- good laser surrounding all lesions        Refraction    Wearing Rx      Sphere Cylinder Axis Add   Right -8.00 +1.50 168 +2.00   Left -5.50 +1.00 036 +2.00   Type: PAL          IMAGING AND PROCEDURES  Imaging and Procedures for @TODAY @  OCT, Retina - OU - Both Eyes       Right Eye Quality was good. Central Foveal Thickness: 255. Progression has been stable. Findings include normal foveal contour, no SRF, no IRF, myopic contour (Mildly improved vitreous opacities, prominent floaters centrally -- slightly improved).   Left Eye Quality was good. Central Foveal Thickness: 224. Progression has been stable. Findings include normal foveal contour, no IRF, no SRF, myopic contour (interval improvement in vitreous opacities).   Notes *Images captured and stored on drive  Diagnosis / Impression:  NFP, no IRF/SRF OU Myopic contour OU interval improvement in vitreous opacities OU  Clinical management:  See below  Abbreviations: NFP - Normal foveal profile. CME - cystoid macular edema. PED - pigment epithelial detachment. IRF - intraretinal fluid. SRF - subretinal fluid. EZ - ellipsoid  zone. ERM - epiretinal membrane. ORA - outer retinal atrophy. ORT - outer retinal tubulation. SRHM - subretinal hyper-reflective  material                ASSESSMENT/PLAN:    ICD-10-CM   1. Retinal tear of both eyes  H33.313   2. Bilateral retinal detachment  H33.23   3. Lattice degeneration of both retinas  H35.413   4. Vitreous hemorrhage of both eyes (HCC)  H43.13   5. Retinal edema  H35.81 OCT, Retina - OU - Both Eyes  6. 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 History of radial keratotomy  Z98.890   8. Combined forms of age-related cataract of both eyes  H25.813   9. Glaucoma suspect of both eyes  H40.003     1-5. Retinal tears with focal retinal detachments and vitreous hemorrhage OU          Lattice degeneration w/ atrophic holes OU  - s/p laser retinopexy OD (6.26.20) to large superonasal HST and superior patches of lattice, fill-in (07.01.20) to HST at 1045, fill-in (08.07.20) -- good laser surrounding all lesions  - s/p laser retinopexy OS (06.10.20) and touch up laser with Dr. Ashley RoyaltyMatthews 6.19.2020 -- good laser surrounding lesions OS (HST at 0130 with cuff of +SRF and +lattice within flap, scattered patches of lattice degeneration)  - both eyes with diffuse vitreous hemorrhages 2/2 RTs w/ bridging vessels, vitreous hemes improving OS -- OD with significant VH and visible heme extruding from bridging vessels within large superonasal retinal tear -- heme improved OD today  - s/p cryopexy OD (08.21.20) to cauterize bridging vessels OD -- good early changes in place  - discussed importance of VH precautions  -- minimize activities, keep head elevated, avoid ASA/NSAIDs/blood thinners as able  - f/u 2 weeks  6,7. History of high myopia s/p 8-cut Radial Keratotomy OU  - discussed increased risk of lattice degeneration, RT, RD in high myopes  - stable  - monitor  8. 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  - discussed diagnosis and progression  - approaching  visual significance -- OD > OS  - under the expert management of Dr. Charlotte SanesMcCuen -- will be cleared for cataract surgery ~6 wks after laser retinopexy and after clearance of vitreous hemorrhages  9. Glaucoma Suspect OU  - IOP 19 OD, 16 OS  - +cupping  - +family history of glaucoma  - expert management per Dr. Charlotte SanesMcCuen -- brimonidine TID OU  - add Cosopt BID OU   Ophthalmic Meds Ordered this visit:  No orders of the defined types were placed in this encounter.      Return in about 2 weeks (around 09/08/2019) for POV.  There are no Patient Instructions on file for this visit.   Explained the diagnoses, plan, and follow up with the patient and they expressed understanding.  Patient expressed understanding of the importance of proper follow up care.   This document serves as a record of services personally performed by Karie ChimeraBrian G. Glorene Leitzke, MD, PhD. It was created on their behalf by Laurian BrimAmanda Brown, OA, an ophthalmic assistant. The creation of this record is the provider's dictation and/or activities during the visit.    Electronically signed by: Laurian BrimAmanda Brown, OA  09.04.2020 8:57 AM    Karie ChimeraBrian G. Virginio Isidore, M.D., Ph.D. Diseases & Surgery of  the Retina and Vitreous [facility]  I have reviewed the above documentation for accuracy and completeness, and I agree with the above. Karie ChimeraBrian G. Angelette Ganus, M.D., Ph.D. 08/29/19 8:57 AM    Abbreviations: M myopia (nearsighted); A astigmatism; H hyperopia (farsighted); P presbyopia; Mrx spectacle prescription;  CTL contact lenses; OD right eye; OS left eye; OU both eyes  XT exotropia; ET esotropia; PEK punctate epithelial keratitis; PEE punctate epithelial erosions; DES dry eye syndrome; MGD meibomian gland dysfunction; ATs artificial tears; PFAT's preservative free artificial tears; NSC nuclear sclerotic cataract; PSC posterior subcapsular cataract; ERM epi-retinal membrane; PVD posterior vitreous detachment; RD retinal detachment; DM diabetes  mellitus; DR diabetic retinopathy; NPDR non-proliferative diabetic retinopathy; PDR proliferative diabetic retinopathy; CSME clinically significant macular edema; DME diabetic macular edema; dbh dot blot hemorrhages; CWS cotton wool spot; POAG primary open angle glaucoma; C/D cup-to-disc ratio; HVF humphrey visual field; GVF goldmann visual field; OCT optical coherence tomography; IOP intraocular pressure; BRVO Branch retinal vein occlusion; CRVO central retinal vein occlusion; CRAO central retinal artery occlusion; BRAO branch retinal artery occlusion; RT retinal tear; SB scleral buckle; PPV pars plana vitrectomy; VH Vitreous hemorrhage; PRP panretinal laser photocoagulation; IVK intravitreal kenalog; VMT vitreomacular traction; MH Macular hole;  NVD neovascularization of the disc; NVE neovascularization elsewhere; AREDS age related eye disease study; ARMD age related macular degeneration; POAG primary open angle glaucoma; EBMD epithelial/anterior basement membrane dystrophy; ACIOL anterior chamber intraocular lens; IOL intraocular lens; PCIOL posterior chamber intraocular lens; Phaco/IOL phacoemulsification with intraocular lens placement; PRK photorefractive keratectomy; LASIK laser assisted in situ keratomileusis; HTN hypertension; DM diabetes mellitus; COPD chronic obstructive pulmonary disease

## 2019-08-25 ENCOUNTER — Other Ambulatory Visit: Payer: Self-pay

## 2019-08-25 ENCOUNTER — Ambulatory Visit (INDEPENDENT_AMBULATORY_CARE_PROVIDER_SITE_OTHER): Payer: PRIVATE HEALTH INSURANCE | Admitting: Ophthalmology

## 2019-08-25 DIAGNOSIS — Z9889 Other specified postprocedural states: Secondary | ICD-10-CM

## 2019-08-25 DIAGNOSIS — H52203 Unspecified astigmatism, bilateral: Secondary | ICD-10-CM

## 2019-08-25 DIAGNOSIS — H33313 Horseshoe tear of retina without detachment, bilateral: Secondary | ICD-10-CM

## 2019-08-25 DIAGNOSIS — H5213 Myopia, bilateral: Secondary | ICD-10-CM

## 2019-08-25 DIAGNOSIS — H524 Presbyopia: Secondary | ICD-10-CM

## 2019-08-25 DIAGNOSIS — H4313 Vitreous hemorrhage, bilateral: Secondary | ICD-10-CM

## 2019-08-25 DIAGNOSIS — H3581 Retinal edema: Secondary | ICD-10-CM

## 2019-08-25 DIAGNOSIS — H40003 Preglaucoma, unspecified, bilateral: Secondary | ICD-10-CM

## 2019-08-25 DIAGNOSIS — H25813 Combined forms of age-related cataract, bilateral: Secondary | ICD-10-CM

## 2019-08-25 DIAGNOSIS — H35413 Lattice degeneration of retina, bilateral: Secondary | ICD-10-CM

## 2019-08-25 DIAGNOSIS — H3323 Serous retinal detachment, bilateral: Secondary | ICD-10-CM

## 2019-08-29 ENCOUNTER — Encounter (INDEPENDENT_AMBULATORY_CARE_PROVIDER_SITE_OTHER): Payer: Self-pay | Admitting: Ophthalmology

## 2019-09-15 ENCOUNTER — Other Ambulatory Visit: Payer: Self-pay

## 2019-09-15 ENCOUNTER — Encounter (INDEPENDENT_AMBULATORY_CARE_PROVIDER_SITE_OTHER): Payer: PRIVATE HEALTH INSURANCE | Admitting: Ophthalmology

## 2019-09-15 ENCOUNTER — Ambulatory Visit (INDEPENDENT_AMBULATORY_CARE_PROVIDER_SITE_OTHER): Payer: PRIVATE HEALTH INSURANCE | Admitting: Ophthalmology

## 2019-09-15 ENCOUNTER — Encounter (INDEPENDENT_AMBULATORY_CARE_PROVIDER_SITE_OTHER): Payer: Self-pay | Admitting: Ophthalmology

## 2019-09-15 DIAGNOSIS — H3581 Retinal edema: Secondary | ICD-10-CM | POA: Diagnosis not present

## 2019-09-15 DIAGNOSIS — H25813 Combined forms of age-related cataract, bilateral: Secondary | ICD-10-CM

## 2019-09-15 DIAGNOSIS — H5213 Myopia, bilateral: Secondary | ICD-10-CM

## 2019-09-15 DIAGNOSIS — H4313 Vitreous hemorrhage, bilateral: Secondary | ICD-10-CM

## 2019-09-15 DIAGNOSIS — H52203 Unspecified astigmatism, bilateral: Secondary | ICD-10-CM

## 2019-09-15 DIAGNOSIS — H35413 Lattice degeneration of retina, bilateral: Secondary | ICD-10-CM

## 2019-09-15 DIAGNOSIS — H40003 Preglaucoma, unspecified, bilateral: Secondary | ICD-10-CM

## 2019-09-15 DIAGNOSIS — Z9889 Other specified postprocedural states: Secondary | ICD-10-CM

## 2019-09-15 DIAGNOSIS — H524 Presbyopia: Secondary | ICD-10-CM

## 2019-09-15 DIAGNOSIS — H33313 Horseshoe tear of retina without detachment, bilateral: Secondary | ICD-10-CM

## 2019-09-15 DIAGNOSIS — H3323 Serous retinal detachment, bilateral: Secondary | ICD-10-CM

## 2019-09-15 NOTE — Progress Notes (Signed)
[facility] - Clinic Note  09/15/2019     CHIEF COMPLAINT Patient presents for Retina Follow Up   HISTORY OF PRESENT ILLNESS: Bradley Watson is a 58 y.o. male who presents to the clinic today for:   HPI    Retina Follow Up    In both eyes.  This started 1 month ago.  Since onset it is stable.  I, the attending physician,  performed the HPI with the patient and updated documentation appropriately.          Comments    3 week F/U Rt OU. Patient states "I think my vision is about the same " denies new visual issues/onsets.       Last edited by Rennis Chris, MD on 09/15/2019 11:46 AM. (History)    pt states feels like everything is about the same with his vision  Referring physician: Maris Berger, MD [address] [place],  [place] [address]  HISTORICAL INFORMATION:   Selected notes from the MEDICAL RECORD NUMBER Referred by Dr. Maris Berger for concern of retinal tear OS LEE: 06.10.20 (C. McCuen) [BCVA: OD: 20/40 OS: 20/25] Ocular Hx- PMH-glaucoma suspect, lattice OU, NS OU, myopia, h/o RK OU   CURRENT MEDICATIONS: Current Outpatient Medications (Ophthalmic Drugs)  Medication Sig  . brimonidine (ALPHAGAN) 0.2 % ophthalmic solution Place 1 drop into both eyes 3 (three) times daily.  Marland Kitchen neomycin-polymyxin-dexameth (MAXITROL) 0.1 % OINT Place 1 application into the right eye 4 (four) times daily.  . prednisoLONE acetate (PRED FORTE) 1 % ophthalmic suspension Place 1 drop into the right eye 4 (four) times daily.   No current facility-administered medications for this visit.  (Ophthalmic Drugs)   Current Outpatient Medications (Other)  Medication Sig  . aspirin 81 MG EC tablet Take 1 tablet (81 mg total) by mouth daily.  . cyclobenzaprine (FLEXERIL) 10 MG tablet Take 1 tablet (10 mg total) by mouth 2 (two) times daily as needed for up to 20 doses for muscle spasms.  . Diclofenac Sodium 2 % SOLN Place 2 g onto the skin 2 (two) times daily.  Marland Kitchen  doxycycline (VIBRA-TABS) 100 MG tablet Take 1 tablet (100 mg total) by mouth 2 (two) times daily.  . hydrOXYzine (ATARAX/VISTARIL) 10 MG tablet Take 30-60 mg by mouth daily as needed for itching.   Marland Kitchen ibuprofen (ADVIL,MOTRIN) 800 MG tablet Take 1 tablet (800 mg total) by mouth every 8 (eight) hours as needed for up to 30 doses.  Marland Kitchen omeprazole (PRILOSEC) 20 MG capsule Take 20 mg by mouth daily as needed (heartburn).   . ondansetron (ZOFRAN ODT) 4 MG disintegrating tablet Take 1 tablet (4 mg total) by mouth every 8 (eight) hours as needed for nausea or vomiting.  Marland Kitchen oxyCODONE-acetaminophen (PERCOCET/ROXICET) 5-325 MG tablet Take 1 tablet by mouth every 8 (eight) hours as needed for severe pain.  Marland Kitchen tacrolimus (PROTOPIC) 0.1 % ointment APP EXT TO THE SKIN BID  . tamsulosin (FLOMAX) 0.4 MG CAPS capsule Take 1 capsule (0.4 mg total) by mouth daily after supper.  . triamcinolone ointment (KENALOG) 0.1 % APPLY ON THE SKIN TWICE A DAY AS NEEDED   No current facility-administered medications for this visit.  (Other)      REVIEW OF SYSTEMS: ROS    Positive for: Eyes   Negative for: Constitutional, Gastrointestinal, Neurological, Skin, Genitourinary, Musculoskeletal, HENT, Endocrine, Cardiovascular, Respiratory, Psychiatric, Allergic/Imm, Heme/Lymph   Last edited by Eldridge Scot, LPN on 9/38/1017  9:55 AM. (History)  ALLERGIES Allergies  Allergen Reactions  . Penicillins Anxiety and Other (See Comments)    "Made me feel like gravity had increased and it was pulling me down" (10/27/2012) Did it involve swelling of the face/tongue/throat, SOB, or low BP? No Did it involve sudden or severe rash/hives, skin peeling, or any reaction on the inside of your mouth or nose? No Did you need to seek medical attention at a hospital or doctor's office? No When did it last happen?Over 10 years ago If all above answers are "NO", may proceed with cephalosporin use.  Marland Kitchen Lisinopril Cough    PAST MEDICAL  HISTORY Past Medical History:  Diagnosis Date  . Carotid stenosis    Pre-CABG Dopplers: RICA 40-59%;  Carotid US (05/2014):  R 1-39%; normal LICA  . Cataract    OU  . Childhood asthma   . Cluster headaches 1985   Mid 80's  . Coronary artery disease    a.  LHC 10/27/12 demonstrated pLAD 70%, AV CFX occluded, pOM 80%, dCFX collateralized the LAD and RCA, RCA without obstructive lesions, EF 50% with inferobasal HK;  b.  Echo 10/30/12: EF 55-60%, grade 1 diastolic dysfunction, mild LAE, normal LV wall motion;  c. s/p CABG 10/31/12 with Dr. Dorris Fetch:  L-LAD, left radial-OM;   b.  ETT-Nuc (05/2014):  + ECG changes, no ischemia; low risk  . Diverticulitis 02-2012   CT scan   . Fatty liver 02-2012   Mild- CT scan   . GERD (gastroesophageal reflux disease)   . HLD (hyperlipidemia)   . Hx of cardiovascular stress test    ETT-Myovew (05/2014):  + ECG changes with down-sloping ST depression; images with apical thinning but no ischemia, EF 52%; Low Risk  . Iron deficiency anemia 1980's   Past Surgical History:  Procedure Laterality Date  . CARDIAC CATHETERIZATION  10/27/2012  . CORONARY ARTERY BYPASS GRAFT  10/31/2012   Procedure: CORONARY ARTERY BYPASS GRAFTING (CABG);  Surgeon: Loreli Slot, MD;  Location: [facility] OR;  Service: Open Heart Surgery;  Laterality: N/A;  . LEFT HEART CATHETERIZATION WITH CORONARY ANGIOGRAM N/A 10/27/2012   Procedure: LEFT HEART CATHETERIZATION WITH CORONARY ANGIOGRAM;  Surgeon: Herby Abraham, MD;  Location: [facility] CATH LAB;  Service: Cardiovascular;  Laterality: N/A;  . RADIAL ARTERY HARVEST  10/31/2012   Procedure: RADIAL ARTERY HARVEST;  Surgeon: Loreli Slot, MD;  Location: [facility] OR;  Service: Open Heart Surgery;  Laterality: Left;  . RADIAL KERATOTOMY  1985   bilaterally    FAMILY HISTORY Family History  Problem Relation Age of Onset  . Lung cancer Father   . Diabetes Mellitus II Paternal Grandmother     SOCIAL HISTORY Social History   Tobacco  Use  . Smoking status: Former Smoker    Packs/day: 0.12    Years: 1.50    Pack years: 0.18  . Smokeless tobacco: Never Used  Substance Use Topics  . Alcohol use: Yes    Comment: occ  . Drug use: No    Types: "Crack" cocaine         OPHTHALMIC EXAM:  Base Eye Exam    Visual Acuity (Snellen - Linear)      Right Left   Dist cc 20/80 20/20 -1   Dist ph cc 20/50 NI   Correction: Glasses       Tonometry (Tonopen, 9:54 AM)      Right Left   Pressure 18 12       Pupils      Dark  Light Shape React APD   Right 3 2 Round Brisk None   Left 3 2 Round Brisk None       Visual Fields (Counting fingers)      Left Right    Full Full       Extraocular Movement      Right Left    Full, Ortho Full, Ortho       Neuro/Psych    Oriented x3: Yes   Mood/Affect: Normal       Dilation    Both eyes: 1.0% Mydriacyl, 2.5% Phenylephrine @ 9:49 AM       Dilation #2    Both eyes: 1.0% Mydriacyl, 2.5% Phenylephrine @ 11:29 AM        Slit Lamp and Fundus Exam    Slit Lamp Exam      Right Left   Lids/Lashes Dermatochalasis - mild MGD Dermatochalasis - upper lid   Conjunctiva/Sclera Melanosis Melanosis, trace temporal Pinguecula   Cornea 8 RK scars, mild Arcus 8 RK scars, mild Arcus   Anterior Chamber Deep and clear Deep and quiet   Iris Round and moderately dilated Round and moderately dilated   Lens 2+ Nuclear sclerosis, 2+ Cortical cataract 2+ Nuclear sclerosis, 2-3+ Cortical cataract   Vitreous Vitreous syneresis, Posterior vitreous detachment, blood stained vitreous condensations settling inferiorly, diffuse VH consolidating/clearing centrally and settling inferiorly +RBC Vitreous syneresis, +pigment, Posterior vitreous detachment, blood stained vitreous condensations centrally-clearing peripherally and settling inferiorly       Fundus Exam      Right Left   Disc Sharp rim, +cupping, mild pallor, Peripapillary atrophy Pink, sharp.  +cupping.   C/D Ratio 0.8 0.65   Macula  Very hazy view, flat, Blunted foveal reflex, trace Epiretinal membrane, RPE mottling and clumping, No heme or edema flat, trace ERM, no heme or edema   Vessels Vascular attenuation, mild tortuosity Vascular attenuation, Copper wiring   Periphery Hazy view temporally; Attached, pigmented lattice at 1200, 0100; HST with bridging vessel 0130-0200 -- good cryo surrounding, bridging vessels sclerosed; pigmented paving stone from 0600-0730, pigmented CR scar and lattice at 1030, small HST at 1045 -- good laser surrounding all lesions Retinal break at 1200, HST from 0100-0130 with cuff of +SRF and +lattice within flap, round holes at 0200 with +SRF, pigmented CR scar at 0200 just anterior to holes, pigmented paving stone inferiorly -- good laser surrounding all lesions          IMAGING AND PROCEDURES  Imaging and Procedures for @TODAY @  OCT, Retina - OU - Both Eyes       Right Eye Quality was good. Central Foveal Thickness: 251. Progression has been stable. Findings include normal foveal contour, no SRF, no IRF, myopic contour (Persistent vitreous opacities, prominent floaters centrally -- slightly improved).   Left Eye Quality was good. Central Foveal Thickness: 270. Progression has been stable. Findings include normal foveal contour, no IRF, no SRF, myopic contour (Mild interval improvement in vitreous opacities).   Notes *Images captured and stored on drive  Diagnosis / Impression:  NFP, no IRF/SRF OU Myopic contour OU Persistent vitreous opacities OD; mild interval improvement in vitreous opacities OS  Clinical management:  See below  Abbreviations: NFP - Normal foveal profile. CME - cystoid macular edema. PED - pigment epithelial detachment. IRF - intraretinal fluid. SRF - subretinal fluid. EZ - ellipsoid zone. ERM - epiretinal membrane. ORA - outer retinal atrophy. ORT - outer retinal tubulation. SRHM - subretinal hyper-reflective material  ASSESSMENT/PLAN:     ICD-10-CM   1. Retinal tear of both eyes  H33.313   2. Bilateral retinal detachment  H33.23   3. Lattice degeneration of both retinas  H35.413   4. Vitreous hemorrhage of both eyes (HCC)  H43.13   5. Retinal edema  H35.81 OCT, Retina - OU - Both Eyes  6. 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 History of radial keratotomy  Z98.890   8. Combined forms of age-related cataract of both eyes  H25.813   9. Glaucoma suspect of both eyes  H40.003     1-5. Retinal tears with focal retinal detachments and vitreous hemorrhage OU          Lattice degeneration w/ atrophic holes OU  - s/p laser retinopexy OD (6.26.20) to large superonasal HST and superior patches of lattice, fill-in (07.01.20) to HST at 1045, fill-in (08.07.20) -- good laser surrounding all lesions  - s/p laser retinopexy OS (06.10.20) and touch up laser with Dr. Ashley RoyaltyMatthews 6.19.2020 -- good laser surrounding lesions OS (HST at 0130 with cuff of +SRF and +lattice within flap, scattered patches of lattice degeneration)  - both eyes with diffuse vitreous hemorrhages 2/2 RTs w/ bridging vessels, vitreous hemes improved OU  - s/p cryopexy OD (08.21.20) to cauterize bridging vessels OD -- good cryo changes in place and bridging vessels sclerosed  - all retinal lesions now well treated with laser and cryo -- now just residual VH and vitreous floaters limiting vision OD -- okay to proceed with f/u with Dr. Charlotte SanesMcCuen  - discussed importance of VH precautions  -- minimize activities, keep head elevated, avoid ASA/NSAIDs/blood thinners as able  - f/u 3-4 weeks  6,7. History of high myopia s/p 8-cut Radial Keratotomy OU  - discussed increased risk of lattice degeneration, RT, RD in high myopes  - stable  - monitor  8. 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  - discussed diagnosis and progression  - visually  significant-- OD > OS  - under the expert management of Dr. Charlotte SanesMcCuen   - clear from a retina standpoint to proceed with cataract surgery when pt and surgeon are ready  9. Glaucoma Suspect OU  - IOP 18 OD, 12 OS  - +cupping  - +family history of glaucoma  - expert management per Dr. Charlotte SanesMcCuen  - cont brimonidine TID OU and Cosopt BID OU   Ophthalmic Meds Ordered this visit:  No orders of the defined types were placed in this encounter.      Return for f/u 3-4 weeks VH OU, DFE, OCT.  There are no Patient Instructions on file for this visit.   Explained the diagnoses, plan, and follow up with the patient and they expressed understanding.  Patient expressed understanding of the importance of proper follow up care.   This document serves as a record of services personally performed by Karie ChimeraBrian G. Jerrin Recore, MD, PhD. It was created on their behalf by Laurian BrimAmanda Brown, OA, an ophthalmic assistant. The creation of this record is the provider's dictation and/or activities during the visit.    Electronically signed by: Laurian BrimAmanda Brown, OA 09.25.2020 4:45 PM    Karie ChimeraBrian G. Ruie Sendejo, M.D., Ph.D. Diseases & Surgery of the Retina and Vitreous [facility]  I have reviewed the above documentation for accuracy and completeness, and I  agree with the above. Karie Chimera, M.D., Ph.D. 09/16/19 4:45 PM    Abbreviations: M myopia (nearsighted); A astigmatism; H hyperopia (farsighted); P presbyopia; Mrx spectacle prescription;  CTL contact lenses; OD right eye; OS left eye; OU both eyes  XT exotropia; ET esotropia; PEK punctate epithelial keratitis; PEE punctate epithelial erosions; DES dry eye syndrome; MGD meibomian gland dysfunction; ATs artificial tears; PFAT's preservative free artificial tears; NSC nuclear sclerotic cataract; PSC posterior subcapsular cataract; ERM epi-retinal membrane; PVD posterior vitreous detachment; RD retinal detachment; DM diabetes mellitus; DR diabetic retinopathy;  NPDR non-proliferative diabetic retinopathy; PDR proliferative diabetic retinopathy; CSME clinically significant macular edema; DME diabetic macular edema; dbh dot blot hemorrhages; CWS cotton wool spot; POAG primary open angle glaucoma; C/D cup-to-disc ratio; HVF humphrey visual field; GVF goldmann visual field; OCT optical coherence tomography; IOP intraocular pressure; BRVO Branch retinal vein occlusion; CRVO central retinal vein occlusion; CRAO central retinal artery occlusion; BRAO branch retinal artery occlusion; RT retinal tear; SB scleral buckle; PPV pars plana vitrectomy; VH Vitreous hemorrhage; PRP panretinal laser photocoagulation; IVK intravitreal kenalog; VMT vitreomacular traction; MH Macular hole;  NVD neovascularization of the disc; NVE neovascularization elsewhere; AREDS age related eye disease study; ARMD age related macular degeneration; POAG primary open angle glaucoma; EBMD epithelial/anterior basement membrane dystrophy; ACIOL anterior chamber intraocular lens; IOL intraocular lens; PCIOL posterior chamber intraocular lens; Phaco/IOL phacoemulsification with intraocular lens placement; PRK photorefractive keratectomy; LASIK laser assisted in situ keratomileusis; HTN hypertension; DM diabetes mellitus; COPD chronic obstructive pulmonary disease

## 2019-10-02 NOTE — Progress Notes (Signed)
[facility] - Clinic Note  10/06/2019     CHIEF COMPLAINT Patient presents for Retina Follow Up   HISTORY OF PRESENT ILLNESS: Bradley Watson is a 58 y.o. male who presents to the clinic today for:   HPI    Retina Follow Up    Patient presents with  Other.  In right eye.  This started weeks ago.  Severity is moderate.  Duration of weeks.  Since onset it is stable.  I, the attending physician,  performed the HPI with the patient and updated documentation appropriately.          Comments    Patient states his vision is about the same OD.  Patient complains of still being able to see blood in right eye--states he has to move his right eye around to get past it.  Patient states his eyes only hurt if he's pushing on them.  Patient denies any new or worsening floaters or fol OU.  Patient is scheduled for CE/IOL OD 10/12/19 with Dr. Charlotte Sanes       Last edited by Rennis Chris, MD on 10/06/2019 10:53 AM. (History)    pt states he is using cosopt and brim TID OU, he states he still sees a lot of blood in his eye, but states he can tell he did better on the eye chart today, pt has cataract sx scheduled for October 22 in the right eye   Referring physician: Maris Berger, MD [address] [place],  [place] [address]  HISTORICAL INFORMATION:   Selected notes from the MEDICAL RECORD NUMBER Referred by Dr. Maris Berger for concern of retinal tear OS LEE: 06.10.20 (C. McCuen) [BCVA: OD: 20/40 OS: 20/25] Ocular Hx- PMH-glaucoma suspect, lattice OU, NS OU, myopia, h/o RK OU   CURRENT MEDICATIONS: Current Outpatient Medications (Ophthalmic Drugs)  Medication Sig  . brimonidine (ALPHAGAN) 0.2 % ophthalmic solution Place 1 drop into both eyes 3 (three) times daily.  Marland Kitchen neomycin-polymyxin-dexameth (MAXITROL) 0.1 % OINT Place 1 application into the right eye 4 (four) times daily.  . prednisoLONE acetate (PRED FORTE) 1 % ophthalmic suspension Place 1 drop into the  right eye 4 (four) times daily.   No current facility-administered medications for this visit.  (Ophthalmic Drugs)   Current Outpatient Medications (Other)  Medication Sig  . aspirin 81 MG EC tablet Take 1 tablet (81 mg total) by mouth daily.  . cyclobenzaprine (FLEXERIL) 10 MG tablet Take 1 tablet (10 mg total) by mouth 2 (two) times daily as needed for up to 20 doses for muscle spasms.  . Diclofenac Sodium 2 % SOLN Place 2 g onto the skin 2 (two) times daily.  Marland Kitchen doxycycline (VIBRA-TABS) 100 MG tablet Take 1 tablet (100 mg total) by mouth 2 (two) times daily.  . hydrOXYzine (ATARAX/VISTARIL) 10 MG tablet Take 30-60 mg by mouth daily as needed for itching.   Marland Kitchen ibuprofen (ADVIL,MOTRIN) 800 MG tablet Take 1 tablet (800 mg total) by mouth every 8 (eight) hours as needed for up to 30 doses.  Marland Kitchen omeprazole (PRILOSEC) 20 MG capsule Take 20 mg by mouth daily as needed (heartburn).   . ondansetron (ZOFRAN ODT) 4 MG disintegrating tablet Take 1 tablet (4 mg total) by mouth every 8 (eight) hours as needed for nausea or vomiting.  Marland Kitchen oxyCODONE-acetaminophen (PERCOCET/ROXICET) 5-325 MG tablet Take 1 tablet by mouth every 8 (eight) hours as needed for severe pain.  Marland Kitchen tacrolimus (PROTOPIC) 0.1 % ointment APP EXT TO THE SKIN  BID  . tamsulosin (FLOMAX) 0.4 MG CAPS capsule Take 1 capsule (0.4 mg total) by mouth daily after supper.  . triamcinolone ointment (KENALOG) 0.1 % APPLY ON THE SKIN TWICE A DAY AS NEEDED   No current facility-administered medications for this visit.  (Other)      REVIEW OF SYSTEMS: ROS    Positive for: Eyes   Negative for: Constitutional, Gastrointestinal, Neurological, Skin, Genitourinary, Musculoskeletal, HENT, Endocrine, Cardiovascular, Respiratory, Psychiatric, Allergic/Imm, Heme/Lymph   Last edited by Corrinne Eagle on 10/06/2019  9:53 AM. (History)       ALLERGIES Allergies  Allergen Reactions  . Penicillins Anxiety and Other (See Comments)    "Made me feel like  gravity had increased and it was pulling me down" (10/27/2012) Did it involve swelling of the face/tongue/throat, SOB, or low BP? No Did it involve sudden or severe rash/hives, skin peeling, or any reaction on the inside of your mouth or nose? No Did you need to seek medical attention at a hospital or doctor's office? No When did it last happen?Over 10 years ago If all above answers are "NO", may proceed with cephalosporin use.  Marland Kitchen Lisinopril Cough    PAST MEDICAL HISTORY Past Medical History:  Diagnosis Date  . Carotid stenosis    Pre-CABG Dopplers: RICA 40-59%;  Carotid US (05/2014):  R 1-39%; normal LICA  . Cataract    OU  . Childhood asthma   . Cluster headaches 1985   Mid 80's  . Coronary artery disease    a.  LHC 10/27/12 demonstrated pLAD 70%, AV CFX occluded, pOM 80%, dCFX collateralized the LAD and RCA, RCA without obstructive lesions, EF 50% with inferobasal HK;  b.  Echo 10/30/12: EF 55-60%, grade 1 diastolic dysfunction, mild LAE, normal LV wall motion;  c. s/p CABG 10/31/12 with Dr. Dorris Fetch:  L-LAD, left radial-OM;   b.  ETT-Nuc (05/2014):  + ECG changes, no ischemia; low risk  . Diverticulitis 02-2012   CT scan   . Fatty liver 02-2012   Mild- CT scan   . GERD (gastroesophageal reflux disease)   . HLD (hyperlipidemia)   . Hx of cardiovascular stress test    ETT-Myovew (05/2014):  + ECG changes with down-sloping ST depression; images with apical thinning but no ischemia, EF 52%; Low Risk  . Iron deficiency anemia 1980's   Past Surgical History:  Procedure Laterality Date  . CARDIAC CATHETERIZATION  10/27/2012  . CORONARY ARTERY BYPASS GRAFT  10/31/2012   Procedure: CORONARY ARTERY BYPASS GRAFTING (CABG);  Surgeon: Loreli Slot, MD;  Location: [facility] OR;  Service: Open Heart Surgery;  Laterality: N/A;  . LEFT HEART CATHETERIZATION WITH CORONARY ANGIOGRAM N/A 10/27/2012   Procedure: LEFT HEART CATHETERIZATION WITH CORONARY ANGIOGRAM;  Surgeon: Herby Abraham, MD;   Location: [facility] CATH LAB;  Service: Cardiovascular;  Laterality: N/A;  . RADIAL ARTERY HARVEST  10/31/2012   Procedure: RADIAL ARTERY HARVEST;  Surgeon: Loreli Slot, MD;  Location: [facility] OR;  Service: Open Heart Surgery;  Laterality: Left;  . RADIAL KERATOTOMY  1985   bilaterally    FAMILY HISTORY Family History  Problem Relation Age of Onset  . Lung cancer Father   . Diabetes Mellitus II Paternal Grandmother     SOCIAL HISTORY Social History   Tobacco Use  . Smoking status: Former Smoker    Packs/day: 0.12    Years: 1.50    Pack years: 0.18  . Smokeless tobacco: Never Used  Substance Use Topics  . Alcohol use:  Yes    Comment: occ  . Drug use: No    Types: "Crack" cocaine         OPHTHALMIC EXAM:  Base Eye Exam    Visual Acuity (Snellen - Linear)      Right Left   Dist cc 20/60 -2 20/20 -1   Dist ph cc 20/30 -2    Correction: Glasses       Tonometry (Tonopen, 9:59 AM)      Right Left   Pressure 18 19       Pupils      Dark Light Shape React APD   Right 2 1 Round Minimal 0   Left 2 1 Round Minimal 0       Visual Fields      Left Right    Full Full       Extraocular Movement      Right Left    Full Full       Neuro/Psych    Oriented x3: Yes   Mood/Affect: Normal       Dilation    Both eyes: 1.0% Mydriacyl, 2.5% Phenylephrine @ 9:59 AM        Slit Lamp and Fundus Exam    Slit Lamp Exam      Right Left   Lids/Lashes Dermatochalasis - mild MGD Dermatochalasis - upper lid   Conjunctiva/Sclera Melanosis Melanosis, trace temporal Pinguecula   Cornea 8 RK scars, mild Arcus 8 RK scars, mild Arcus   Anterior Chamber Deep and clear Deep and quiet   Iris Round and moderately dilated Round and moderately dilated   Lens 2+ Nuclear sclerosis, 2+ Cortical cataract 2+ Nuclear sclerosis, 2-3+ Cortical cataract   Vitreous Vitreous syneresis, Posterior vitreous detachment, vitreous condensations settling inferiorly, VH turning white Vitreous syneresis,  +pigment, Posterior vitreous detachment, blood stained vitreous condensations, red VH settling inferiorly       Fundus Exam      Right Left   Disc Sharp rim, +cupping, mild pallor, Peripapillary atrophy Pink, sharp.  +cupping.   C/D Ratio 0.8 0.65   Macula Very hazy view, flat, Blunted foveal reflex, trace Epiretinal membrane, RPE mottling and clumping, No heme or edema flat, trace ERM, no heme or edema   Vessels Vascular attenuation, mild tortuosity Vascular attenuation, Copper wiring   Periphery Hazy view temporally; Attached, pigmented lattice at 1200, 0100; HST with bridging vessel 0130-0200 -- good cryo surrounding, bridging vessels sclerosed; pigmented paving stone from 0600-0730, pigmented CR scar and lattice at 1030, small HST at 1045 -- good laser surrounding all lesions Retinal break at 1200, HST from 0100-0130 with cuff of +SRF and +lattice within flap, round holes at 0200 with +SRF, pigmented CR scar at 0200 just anterior to holes, pigmented paving stone inferiorly -- good laser surrounding all lesions        Refraction    Wearing Rx      Sphere Cylinder Axis Add   Right -8.00 +1.50 168 +2.00   Left -5.50 +1.00 036 +2.00   Type: PAL          IMAGING AND PROCEDURES  Imaging and Procedures for @  OCT, Retina - OU - Both Eyes       Right Eye Quality was good. Central Foveal Thickness: 258. Progression has been stable. Findings include normal foveal contour, no SRF, no IRF, myopic contour (Mild interval improvement in vitreous opacities, prominent floaters centrally -- slightly improved).   Left Eye Quality was good. Central Foveal Thickness: 275. Progression  has been stable. Findings include normal foveal contour, no IRF, no SRF, myopic contour (Mild interval improvement in vitreous opacities).   Notes *Images captured and stored on drive  Diagnosis / Impression:  NFP, no IRF/SRF OU Myopic contour OU mild interval improvement in vitreous opacities  OU  Clinical management:  See below  Abbreviations: NFP - Normal foveal profile. CME - cystoid macular edema. PED - pigment epithelial detachment. IRF - intraretinal fluid. SRF - subretinal fluid. EZ - ellipsoid zone. ERM - epiretinal membrane. ORA - outer retinal atrophy. ORT - outer retinal tubulation. SRHM - subretinal hyper-reflective material                ASSESSMENT/PLAN:    ICD-10-CM   1. Retinal tear of both eyes  H33.313   2. Bilateral retinal detachment  H33.23   3. Lattice degeneration of both retinas  H35.413   4. Vitreous hemorrhage of both eyes ([place])  H43.13   5. Retinal edema  H35.81 OCT, Retina - OU - Both Eyes  6. 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 History of radial keratotomy  Z98.890   8. Combined forms of age-related cataract of both eyes  H25.813   9. Glaucoma suspect of both eyes  H40.003     1-5. Retinal tears with focal retinal detachments and vitreous hemorrhage OU          Lattice degeneration w/ atrophic holes OU  - s/p laser retinopexy OD (6.26.20) to large superonasal HST and superior patches of lattice, fill-in (07.01.20) to HST at 1045, fill-in (08.07.20) -- good laser surrounding all lesions  - s/p laser retinopexy OS (06.10.20) and touch up laser with Dr. Zigmund Daniel 6.19.2020 -- good laser surrounding lesions OS (HST at 0130 with cuff of +SRF and +lattice within flap, scattered patches of lattice degeneration)  - both eyes with diffuse vitreous hemorrhages 2/2 RTs w/ bridging vessels, vitreous hemes improving OU  - s/p cryopexy OD (08.21.20) to cauterize bridging vessels OD -- good cryo changes in place and bridging vessels sclerosed  - all retinal lesions now well treated with laser and cryo -- now just residual VH and vitreous floaters limiting vision OD -- okay to proceed with f/u with Dr. Ellie Lunch  - discussed importance of VH precautions  -- minimize activities, keep head elevated, avoid  ASA/NSAIDs/blood thinners as able  - f/u 4 weeks  6,7. History of high myopia s/p 8-cut Radial Keratotomy OU  - discussed increased risk of lattice degeneration, RT, RD in high myopes  - stable  - monitor  8. 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  - discussed diagnosis and progression  - visually significant-- OD > OS  - under the expert management of Dr. Ellie Lunch   - clear from a retina standpoint to proceed with cataract surgery when pt and surgeon are ready  - right eye scheduled for October 22 with Dr. Ellie Lunch  9. Glaucoma Suspect OU  - IOP 18 OD, 19 OS  - +cupping  - +family history of glaucoma  - expert management per Dr. Ellie Lunch  - cont brimonidine TID OU and Cosopt BID OU   Ophthalmic Meds Ordered this visit:  No orders of the defined types were placed in this encounter.      Return in about 4 weeks (around 11/03/2019) for f/u VH OU, DFE,  OCT.  There are no Patient Instructions on file for this visit.   Explained the diagnoses, plan, and follow up with the patient and they expressed understanding.  Patient expressed understanding of the importance of proper follow up care.   This document serves as a record of services personally performed by Karie ChimeraBrian G. Lexton Hidalgo, MD, PhD. It was created on their behalf by Annalee Gentaaryl Barber, COMT. The creation of this record is the provider's dictation and/or activities during the visit.  Electronically signed by: Annalee Gentaaryl Barber, COMT 10/07/19 1:24 AM  Karie ChimeraBrian G. Zayley Arras, M.D., Ph.D. Diseases & Surgery of the Retina and Vitreous [facility] 10/07/19  I have reviewed the above documentation for accuracy and completeness, and I agree with the above. Karie ChimeraBrian G. Dion Sibal, M.D., Ph.D. 10/07/19 1:24 AM   Abbreviations: M myopia (nearsighted); A astigmatism; H hyperopia (farsighted); P presbyopia; Mrx spectacle prescription;  CTL contact  lenses; OD right eye; OS left eye; OU both eyes  XT exotropia; ET esotropia; PEK punctate epithelial keratitis; PEE punctate epithelial erosions; DES dry eye syndrome; MGD meibomian gland dysfunction; ATs artificial tears; PFAT's preservative free artificial tears; NSC nuclear sclerotic cataract; PSC posterior subcapsular cataract; ERM epi-retinal membrane; PVD posterior vitreous detachment; RD retinal detachment; DM diabetes mellitus; DR diabetic retinopathy; NPDR non-proliferative diabetic retinopathy; PDR proliferative diabetic retinopathy; CSME clinically significant macular edema; DME diabetic macular edema; dbh dot blot hemorrhages; CWS cotton wool spot; POAG primary open angle glaucoma; C/D cup-to-disc ratio; HVF humphrey visual field; GVF goldmann visual field; OCT optical coherence tomography; IOP intraocular pressure; BRVO Branch retinal vein occlusion; CRVO central retinal vein occlusion; CRAO central retinal artery occlusion; BRAO branch retinal artery occlusion; RT retinal tear; SB scleral buckle; PPV pars plana vitrectomy; VH Vitreous hemorrhage; PRP panretinal laser photocoagulation; IVK intravitreal kenalog; VMT vitreomacular traction; MH Macular hole;  NVD neovascularization of the disc; NVE neovascularization elsewhere; AREDS age related eye disease study; ARMD age related macular degeneration; POAG primary open angle glaucoma; EBMD epithelial/anterior basement membrane dystrophy; ACIOL anterior chamber intraocular lens; IOL intraocular lens; PCIOL posterior chamber intraocular lens; Phaco/IOL phacoemulsification with intraocular lens placement; PRK photorefractive keratectomy; LASIK laser assisted in situ keratomileusis; HTN hypertension; DM diabetes mellitus; COPD chronic obstructive pulmonary disease

## 2019-10-06 ENCOUNTER — Ambulatory Visit (INDEPENDENT_AMBULATORY_CARE_PROVIDER_SITE_OTHER): Payer: PRIVATE HEALTH INSURANCE | Admitting: Ophthalmology

## 2019-10-06 ENCOUNTER — Other Ambulatory Visit: Payer: Self-pay

## 2019-10-06 DIAGNOSIS — H3581 Retinal edema: Secondary | ICD-10-CM

## 2019-10-06 DIAGNOSIS — H5213 Myopia, bilateral: Secondary | ICD-10-CM

## 2019-10-06 DIAGNOSIS — H25813 Combined forms of age-related cataract, bilateral: Secondary | ICD-10-CM

## 2019-10-06 DIAGNOSIS — H33313 Horseshoe tear of retina without detachment, bilateral: Secondary | ICD-10-CM

## 2019-10-06 DIAGNOSIS — H52203 Unspecified astigmatism, bilateral: Secondary | ICD-10-CM

## 2019-10-06 DIAGNOSIS — H35413 Lattice degeneration of retina, bilateral: Secondary | ICD-10-CM

## 2019-10-06 DIAGNOSIS — H3323 Serous retinal detachment, bilateral: Secondary | ICD-10-CM

## 2019-10-06 DIAGNOSIS — H524 Presbyopia: Secondary | ICD-10-CM

## 2019-10-06 DIAGNOSIS — H40003 Preglaucoma, unspecified, bilateral: Secondary | ICD-10-CM

## 2019-10-06 DIAGNOSIS — H4313 Vitreous hemorrhage, bilateral: Secondary | ICD-10-CM

## 2019-10-06 DIAGNOSIS — Z9889 Other specified postprocedural states: Secondary | ICD-10-CM

## 2019-10-07 ENCOUNTER — Encounter (INDEPENDENT_AMBULATORY_CARE_PROVIDER_SITE_OTHER): Payer: Self-pay | Admitting: Ophthalmology

## 2019-10-20 ENCOUNTER — Other Ambulatory Visit: Payer: Self-pay

## 2019-10-20 DIAGNOSIS — Z20822 Contact with and (suspected) exposure to covid-19: Secondary | ICD-10-CM

## 2019-10-21 LAB — NOVEL CORONAVIRUS, NAA: SARS-CoV-2, NAA: NOT DETECTED

## 2019-10-24 ENCOUNTER — Encounter: Payer: Self-pay | Admitting: Internal Medicine

## 2019-10-24 ENCOUNTER — Ambulatory Visit (INDEPENDENT_AMBULATORY_CARE_PROVIDER_SITE_OTHER): Payer: PRIVATE HEALTH INSURANCE | Admitting: Internal Medicine

## 2019-10-24 DIAGNOSIS — R05 Cough: Secondary | ICD-10-CM | POA: Diagnosis not present

## 2019-10-24 DIAGNOSIS — R059 Cough, unspecified: Secondary | ICD-10-CM

## 2019-10-24 MED ORDER — BENZONATATE 200 MG PO CAPS: 200.0000 mg | ORAL_CAPSULE | Freq: Three times a day (TID) | ORAL | 0 refills | Status: DC | PRN

## 2019-10-24 MED ORDER — DOXYCYCLINE HYCLATE 100 MG PO TABS: 100.0000 mg | ORAL_TABLET | Freq: Two times a day (BID) | ORAL | 0 refills | Status: DC

## 2019-10-24 NOTE — Assessment & Plan Note (Signed)
Could be sinusitis and rx doxycycline and tessalon perles. Encouraged to continue allergy medicine otc. Covid-19 testing done and negative.

## 2019-10-24 NOTE — Progress Notes (Signed)
Virtual Visit via Video Note  I connected with Bradley Watson on 10/24/19 at  8:40 AM EST by a video enabled telemedicine application and verified that I am speaking with the correct person using two identifiers.  The patient and the provider were at separate locations throughout the entire encounter.   I discussed the limitations of evaluation and management by telemedicine and the availability of in person appointments. The patient expressed understanding and agreed to proceed. The patient and the provider were the only parties present for the visit unless noted in HPI below.  History of Present Illness: The patient is a 58 y.o. man with visit for cough and congestions. Started about 10 days ago while he was at his eye doctor's office. He was using the mask and this caused some sneezing and coughing. He continued to feel worse and worse. He has had congestion and cough which is at times productive. Denies SOB. Denies fevers or chills. Tested for covid-19 on 10/20/19 which is negative. He has been using otc cold products with temporary relief but overall not improving. Overall it is stable but not improving. Has tried allergy medication otc as well which is not helping  Observations/Objective: Appearance: obese, breathing appears normal, coughing non-productive during visit, no respiratory distress and speaking in full sentences, casual grooming, abdomen does not appear distended, throat not visualized due to poor video quality, memory normal, mental status is A and O times 3  Assessment and Plan: See problem oriented charting  Follow Up Instructions: rx doxycycline and tessalon perles  Visit time 25 minutes: greater than 50% of that time was spent in face to face counseling and coordination of care with the patient: counseled about covid-19 and testing and timing of testing in ruling out covid-19, potential causes to his symptoms as well as treatment and need to still isolate until feeling well  given that his covid-19 test was done almost a week after symptoms started could have missed infectious window  I discussed the assessment and treatment plan with the patient. The patient was provided an opportunity to ask questions and all were answered. The patient agreed with the plan and demonstrated an understanding of the instructions.   The patient was advised to call back or seek an in-person evaluation if the symptoms worsen or if the condition fails to improve as anticipated.  Hoyt Koch, MD

## 2019-11-02 ENCOUNTER — Ambulatory Visit (INDEPENDENT_AMBULATORY_CARE_PROVIDER_SITE_OTHER): Payer: PRIVATE HEALTH INSURANCE | Admitting: Internal Medicine

## 2019-11-02 ENCOUNTER — Other Ambulatory Visit: Payer: Self-pay

## 2019-11-02 ENCOUNTER — Telehealth: Payer: Self-pay

## 2019-11-02 DIAGNOSIS — J0100 Acute maxillary sinusitis, unspecified: Secondary | ICD-10-CM

## 2019-11-02 DIAGNOSIS — J40 Bronchitis, not specified as acute or chronic: Secondary | ICD-10-CM

## 2019-11-02 DIAGNOSIS — J301 Allergic rhinitis due to pollen: Secondary | ICD-10-CM | POA: Diagnosis not present

## 2019-11-02 MED ORDER — LEVOFLOXACIN 500 MG PO TABS: 500.0000 mg | ORAL_TABLET | Freq: Every day | ORAL | 0 refills | Status: DC

## 2019-11-02 MED ORDER — PSEUDOEPHEDRINE HCL ER 120 MG PO TB12: 120.0000 mg | ORAL_TABLET | Freq: Two times a day (BID) | ORAL | 0 refills | Status: DC | PRN

## 2019-11-02 MED ORDER — PROMETHAZINE-CODEINE 6.25-10 MG/5ML PO SYRP: 5.0000 mL | ORAL_SOLUTION | ORAL | 0 refills | Status: DC | PRN

## 2019-11-02 MED ORDER — LORATADINE 10 MG PO TABS: 10.0000 mg | ORAL_TABLET | Freq: Every day | ORAL | 3 refills | Status: DC

## 2019-11-02 NOTE — Telephone Encounter (Signed)
Copied from [place] [id number]. Topic: Quick Communication - See Telephone Encounter >> Nov 02, 2019 11:33 AM Loma Boston wrote: CRM for notification. See Telephone encounter for: 11/02/19. Pt wants a FU to appt for Covid type symptoms on 11/3. He had appt due to Sharlet Salina doing more of the virtual appt. States the med is not working and thinks that the congestion is not improved at all. He wants the dr or nurse to FU with him as he fills he need a strong med and knows he can not come in. [phone]  Patient agrees to virtual visit today with dr plotnikov--dr crawford out of office---already covid tested negative--advised we will send link to his email, and he can connect with dr plotnikov at 4pm today---advised if breathing worsens, go to ED or call 911

## 2019-11-02 NOTE — Progress Notes (Signed)
Virtual Visit via Video Note  I connected with Bradley Watson on 11/02/19 at  4:00 PM EST by a video enabled telemedicine application and verified that I am speaking with the correct person using two identifiers.   I discussed the limitations of evaluation and management by telemedicine and the availability of in person appointments. The patient expressed understanding and agreed to proceed.  History of Present Illness: The patient is complaining of severe nasal congestion, productive cough, nighttime cough.  He is not better after the course of doxycycline.  He tested COVID-19 negative.  No fever  There has been no chest pain, shortness of breath, abdominal pain, diarrhea, constipation, arthralgias, skin rashes.   Observations/Objective: The patient appears to be in no acute distress.  Assessment and Plan:  See my Assessment and Plan. Follow Up Instructions:    I discussed the assessment and treatment plan with the patient. The patient was provided an opportunity to ask questions and all were answered. The patient agreed with the plan and demonstrated an understanding of the instructions.   The patient was advised to call back or seek an in-person evaluation if the symptoms worsen or if the condition fails to improve as anticipated.  I provided face-to-face time during this encounter. We were at different locations.   Walker Kehr, MD

## 2019-11-03 ENCOUNTER — Encounter: Payer: Self-pay | Admitting: Internal Medicine

## 2019-11-03 ENCOUNTER — Encounter (INDEPENDENT_AMBULATORY_CARE_PROVIDER_SITE_OTHER): Payer: PRIVATE HEALTH INSURANCE | Admitting: Ophthalmology

## 2019-11-03 DIAGNOSIS — J40 Bronchitis, not specified as acute or chronic: Secondary | ICD-10-CM | POA: Insufficient documentation

## 2019-11-03 DIAGNOSIS — J019 Acute sinusitis, unspecified: Secondary | ICD-10-CM | POA: Insufficient documentation

## 2019-11-03 DIAGNOSIS — J309 Allergic rhinitis, unspecified: Secondary | ICD-10-CM | POA: Insufficient documentation

## 2019-11-03 NOTE — Assessment & Plan Note (Signed)
No improvement with doxycycline.  Given Levaquin Promethazine with codeine cough syrup

## 2019-11-03 NOTE — Assessment & Plan Note (Signed)
treated with Doxy - refractory Given Levaquin, Sudafed as needed

## 2019-11-03 NOTE — Assessment & Plan Note (Signed)
Start Claritin.  Sudafed as needed

## 2019-11-08 ENCOUNTER — Encounter (INDEPENDENT_AMBULATORY_CARE_PROVIDER_SITE_OTHER): Payer: PRIVATE HEALTH INSURANCE | Admitting: Ophthalmology

## 2019-11-09 ENCOUNTER — Other Ambulatory Visit: Payer: Self-pay

## 2019-11-09 DIAGNOSIS — Z20822 Contact with and (suspected) exposure to covid-19: Secondary | ICD-10-CM

## 2019-11-12 LAB — NOVEL CORONAVIRUS, NAA: SARS-CoV-2, NAA: NOT DETECTED

## 2019-11-14 ENCOUNTER — Ambulatory Visit (INDEPENDENT_AMBULATORY_CARE_PROVIDER_SITE_OTHER): Payer: PRIVATE HEALTH INSURANCE | Admitting: Internal Medicine

## 2019-11-14 DIAGNOSIS — R062 Wheezing: Secondary | ICD-10-CM | POA: Diagnosis not present

## 2019-11-14 DIAGNOSIS — R05 Cough: Secondary | ICD-10-CM | POA: Diagnosis not present

## 2019-11-14 DIAGNOSIS — R0600 Dyspnea, unspecified: Secondary | ICD-10-CM | POA: Diagnosis not present

## 2019-11-14 DIAGNOSIS — R0602 Shortness of breath: Secondary | ICD-10-CM

## 2019-11-14 DIAGNOSIS — R7303 Prediabetes: Secondary | ICD-10-CM

## 2019-11-14 DIAGNOSIS — R059 Cough, unspecified: Secondary | ICD-10-CM

## 2019-11-14 DIAGNOSIS — I10 Essential (primary) hypertension: Secondary | ICD-10-CM

## 2019-11-14 MED ORDER — PREDNISONE 10 MG PO TABS: ORAL_TABLET | ORAL | 0 refills | Status: DC

## 2019-11-14 MED ORDER — SULFAMETHOXAZOLE-TRIMETHOPRIM 800-160 MG PO TABS: 1.0000 | ORAL_TABLET | Freq: Two times a day (BID) | ORAL | 0 refills | Status: DC

## 2019-11-14 MED ORDER — ALBUTEROL SULFATE HFA 108 (90 BASE) MCG/ACT IN AERS: 2.0000 | INHALATION_SPRAY | Freq: Four times a day (QID) | RESPIRATORY_TRACT | 5 refills | Status: DC | PRN

## 2019-11-14 MED ORDER — HYDROCODONE-HOMATROPINE 5-1.5 MG/5ML PO SYRP: 5.0000 mL | ORAL_SOLUTION | Freq: Four times a day (QID) | ORAL | 0 refills | Status: AC | PRN

## 2019-11-14 NOTE — Progress Notes (Addendum)
[facility] - Clinic Note  11/15/2019     CHIEF COMPLAINT Patient presents for Retina Follow Up   HISTORY OF PRESENT ILLNESS: Bradley Watson is a 58 y.o. male who presents to the clinic today for:   HPI    Retina Follow Up    Patient presents with  Other.  In both eyes.  This started 4 weeks ago.  Severity is moderate.  I, the attending physician,  performed the HPI with the patient and updated documentation appropriately.          Comments    Patient here for 4 weeks retina follow up for s/p retinopexy OU. Patient states vision getting used to vision. Doesn't see as well in the am. Hoping new glasses will help. Has had some eye pain about 1 - 2 weeks ago. Not sure what from. Cant smell or taste. Getting new meds today.       Last edited by Rennis Chris, MD on 11/16/2019 11:50 AM. (History)    pt states he had cataract sx on his right eye on October 22 with Dr. Charlotte Sanes, pt states he has been sick with respiratory issues since his last visit here, he has tested negative for covid twice   Referring physician: Maris Berger, MD [address] [place],  [place] [address]  HISTORICAL INFORMATION:   Selected notes from the MEDICAL RECORD NUMBER Referred by Dr. Maris Berger for concern of retinal tear OS LEE: 06.10.20 (C. [facility]) [BCVA: OD: 20/40 OS: 20/25] Ocular Hx- PMH-glaucoma suspect, lattice OU, NS OU, myopia, h/o RK OU   CURRENT MEDICATIONS: Current Outpatient Medications (Ophthalmic Drugs)  Medication Sig  . brimonidine (ALPHAGAN) 0.2 % ophthalmic solution Place 1 drop into both eyes 3 (three) times daily.  . prednisoLONE acetate (PRED FORTE) 1 % ophthalmic suspension Place 1 drop into the right eye 4 (four) times daily.   No current facility-administered medications for this visit.  (Ophthalmic Drugs)   Current Outpatient Medications (Other)  Medication Sig  . albuterol (VENTOLIN HFA) 108 (90 Base) MCG/ACT inhaler Inhale 2 puffs into the  lungs every 6 (six) hours as needed for wheezing or shortness of breath.  Marland Kitchen aspirin 81 MG EC tablet Take 1 tablet (81 mg total) by mouth daily.  . benzonatate (TESSALON) 200 MG capsule Take 1 capsule (200 mg total) by mouth 3 (three) times daily as needed for cough.  Marland Kitchen HYDROcodone-homatropine (HYCODAN) 5-1.5 MG/5ML syrup Take 5 mLs by mouth every 6 (six) hours as needed for up to 10 days for cough.  . hydrOXYzine (ATARAX/VISTARIL) 10 MG tablet Take 30-60 mg by mouth daily as needed for itching.   Marland Kitchen ibuprofen (ADVIL,MOTRIN) 800 MG tablet Take 1 tablet (800 mg total) by mouth every 8 (eight) hours as needed for up to 30 doses.  Marland Kitchen levofloxacin (LEVAQUIN) 500 MG tablet Take 1 tablet (500 mg total) by mouth daily.  Marland Kitchen loratadine (CLARITIN) 10 MG tablet Take 1 tablet (10 mg total) by mouth daily.  Marland Kitchen omeprazole (PRILOSEC) 20 MG capsule Take 20 mg by mouth daily as needed (heartburn).   . predniSONE (DELTASONE) 10 MG tablet 3 tabs by mouth per day for 3 days,2tabs per day for 3 days,1tab per day for 3 days  . promethazine-codeine (PHENERGAN WITH CODEINE) 6.25-10 MG/5ML syrup Take 5-10 mLs by mouth every 4 (four) hours as needed.  . pseudoephedrine (SUDAFED) 120 MG 12 hr tablet Take 1 tablet (120 mg total) by mouth 2 (two) times daily as  needed for congestion.  . sulfamethoxazole-trimethoprim (BACTRIM DS) 800-160 MG tablet Take 1 tablet by mouth 2 (two) times daily.  . tacrolimus (PROTOPIC) 0.1 % ointment APP EXT TO THE SKIN BID  . tamsulosin (FLOMAX) 0.4 MG CAPS capsule Take 1 capsule (0.4 mg total) by mouth daily after supper. (Patient not taking: Reported on 10/24/2019)  . triamcinolone ointment (KENALOG) 0.1 % APPLY ON THE SKIN TWICE A DAY AS NEEDED   No current facility-administered medications for this visit.  (Other)      REVIEW OF SYSTEMS: ROS    Positive for: Gastrointestinal, Cardiovascular, Eyes   Negative for: Constitutional, Neurological, Skin, Genitourinary, Musculoskeletal, HENT,  Endocrine, Respiratory, Psychiatric, Allergic/Imm, Heme/Lymph   Last edited by Laddie Aquaslarke, Rebecca S, COA on 11/15/2019  9:01 AM. (History)       ALLERGIES Allergies  Allergen Reactions  . Penicillins Anxiety and Other (See Comments)    "Made me feel like gravity had increased and it was pulling me down" (10/27/2012) Did it involve swelling of the face/tongue/throat, SOB, or low BP? No Did it involve sudden or severe rash/hives, skin peeling, or any reaction on the inside of your mouth or nose? No Did you need to seek medical attention at a hospital or doctor's office? No When did it last happen?Over 10 years ago If all above answers are "NO", may proceed with cephalosporin use.  Marland Kitchen. Lisinopril Cough    PAST MEDICAL HISTORY Past Medical History:  Diagnosis Date  . Carotid stenosis    Pre-CABG Dopplers: RICA 40-59%;  Carotid US (05/2014):  R 1-39%; normal LICA  . Cataract    OU  . Childhood asthma   . Cluster headaches 1985   Mid 80's  . Coronary artery disease    a.  LHC 10/27/12 demonstrated pLAD 70%, AV CFX occluded, pOM 80%, dCFX collateralized the LAD and RCA, RCA without obstructive lesions, EF 50% with inferobasal HK;  b.  Echo 10/30/12: EF 55-60%, grade 1 diastolic dysfunction, mild LAE, normal LV wall motion;  c. s/p CABG 10/31/12 with Dr. Dorris FetchHendrickson:  L-LAD, left radial-OM;   b.  ETT-Nuc (05/2014):  + ECG changes, no ischemia; low risk  . Diverticulitis 02-2012   CT scan   . Fatty liver 02-2012   Mild- CT scan   . GERD (gastroesophageal reflux disease)   . HLD (hyperlipidemia)   . Hx of cardiovascular stress test    ETT-Myovew (05/2014):  + ECG changes with down-sloping ST depression; images with apical thinning but no ischemia, EF 52%; Low Risk  . Iron deficiency anemia 1980's   Past Surgical History:  Procedure Laterality Date  . CARDIAC CATHETERIZATION  10/27/2012  . cataract  2020  . CORONARY ARTERY BYPASS GRAFT  10/31/2012   Procedure: CORONARY ARTERY BYPASS GRAFTING  (CABG);  Surgeon: Loreli SlotSteven C Hendrickson, MD;  [facility];  Service: Open Heart Surgery;  Laterality: N/A;  . LEFT HEART CATHETERIZATION WITH CORONARY ANGIOGRAM N/A 10/27/2012   Procedure: LEFT HEART CATHETERIZATION WITH CORONARY ANGIOGRAM;  Surgeon: Herby Abrahamhomas D Stuckey, MD;  [facility];  Service: Cardiovascular;  Laterality: N/A;  . RADIAL ARTERY HARVEST  10/31/2012   Procedure: RADIAL ARTERY HARVEST;  Surgeon: Loreli SlotSteven C Hendrickson, MD;  [facility];  Service: Open Heart Surgery;  Laterality: Left;  . RADIAL KERATOTOMY  1985   bilaterally    FAMILY HISTORY Family History  Problem Relation Age of Onset  . Lung cancer Father   . Diabetes Mellitus II Paternal Grandmother  SOCIAL HISTORY Social History   Tobacco Use  . Smoking status: Former Smoker    Packs/day: 0.12    Years: 1.50    Pack years: 0.18  . Smokeless tobacco: Never Used  Substance Use Topics  . Alcohol use: Yes    Comment: occ  . Drug use: No    Types: "Crack" cocaine         OPHTHALMIC EXAM:  Base Eye Exam    Visual Acuity (Snellen - Linear)      Right Left   Dist cc 20/40 +2 20/25   Dist ph cc 20/30 +1    Correction: Glasses       Tonometry (Tonopen, 8:57 AM)      Right Left   Pressure 18 18       Pupils      Dark Light Shape React APD   Right 2 1 Round Minimal None   Left 2 1 Round Minimal None       Visual Fields (Counting fingers)      Left Right    Full Full       Extraocular Movement      Right Left    Full Full       Neuro/Psych    Oriented x3: Yes   Mood/Affect: Normal       Dilation    Both eyes: 1.0% Mydriacyl, 2.5% Phenylephrine @ 8:57 AM        Slit Lamp and Fundus Exam    Slit Lamp Exam      Right Left   Lids/Lashes Dermatochalasis - mild MGD Dermatochalasis - upper lid   Conjunctiva/Sclera Melanosis Melanosis, trace temporal Pinguecula   Cornea 8 RK scars, mild Arcus, well healed temporal cataract wounds, trace Punctate epithelial erosions 8 RK  scars, mild Arcus   Anterior Chamber Deep and clear Deep and quiet   Iris Round and moderately dilated Round and moderately dilated   Lens PC IOL in good position 2+ Nuclear sclerosis, 2-3+ Cortical cataract   Vitreous Vitreous syneresis, +pigment, Posterior vitreous detachment, vitreous condensations centrally - slightly improved, VH turning white, old VH settled inferiorly Vitreous syneresis, +pigment, Posterior vitreous detachment, vitreous condensations centrally, red VH settling inferiorly       Fundus Exam      Right Left   Disc Sharp rim, +cupping, mild pallor, Peripapillary atrophy Pink, sharp.  +cupping.   C/D Ratio 0.8 0.65   Macula flat, good foveal reflex, trace Epiretinal membrane, RPE mottling and clumping, No heme or edema flat, trace ERM, no heme or edema   Vessels Vascular attenuation, mild tortuosity Vascular attenuation, Copper wiring   Periphery Attached, pigmented lattice at 1200, 0100; HST with bridging vessel 0130-0200 -- good cryo surrounding, bridging vessels sclerosed; pigmented paving stone from 0600-0730, pigmented CR scar and lattice at 1030, small HST at 1045 -- good laser surrounding all lesions Retinal break at 1200, HST from 0100-0130 with cuff of +SRF and +lattice within flap, round holes at 0200 with +SRF, pigmented CR scar at 0200 just anterior to holes, pigmented paving stone inferiorly -- good laser surrounding all lesions        Refraction    Wearing Rx      Sphere Cylinder Axis Add   Right -8.00 +1.50 168 +2.00   Left -5.50 +1.00 036 +2.00   Type: PAL          IMAGING AND PROCEDURES  Imaging and Procedures for @TODAY @  OCT, Retina - OU - Both Eyes  Right Eye Quality was good. Central Foveal Thickness: 258. Progression has been stable. Findings include normal foveal contour, no SRF, no IRF, myopic contour (Mild interval improvement in vitreous opacities, prominent floaters centrally -- slightly improved).   Left Eye Quality was good.  Central Foveal Thickness: 270. Progression has been stable. Findings include normal foveal contour, no IRF, no SRF, myopic contour (Mild interval improvement in vitreous opacities).   Notes *Images captured and stored on drive  Diagnosis / Impression:  NFP, no IRF/SRF OU Myopic contour OU mild interval improvement in vitreous opacities OU  Clinical management:  See below  Abbreviations: NFP - Normal foveal profile. CME - cystoid macular edema. PED - pigment epithelial detachment. IRF - intraretinal fluid. SRF - subretinal fluid. EZ - ellipsoid zone. ERM - epiretinal membrane. ORA - outer retinal atrophy. ORT - outer retinal tubulation. SRHM - subretinal hyper-reflective material                ASSESSMENT/PLAN:    ICD-10-CM   1. Retinal tear of both eyes  H33.313   2. Bilateral retinal detachment  H33.23   3. Lattice degeneration of both retinas  H35.413   4. Vitreous hemorrhage of both eyes (HCC)  H43.13   5. Retinal edema  H35.81 OCT, Retina - OU - Both Eyes  6. 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 History of radial keratotomy  Z98.890   8. Combined forms of age-related cataract of left eye  H25.812   9. Pseudophakia  Z96.1   10. Glaucoma suspect of both eyes  H40.003     1-5. Retinal tears with focal retinal detachments and vitreous hemorrhage OU          Lattice degeneration w/ atrophic holes OU  - s/p laser retinopexy OD (6.26.20) to large superonasal HST and superior patches of lattice, fill-in (07.01.20) to HST at 1045, fill-in (08.07.20) -- good laser surrounding all lesions  - s/p laser retinopexy OS (06.10.20) and touch up laser with Dr. Ashley Royalty 6.19.2020 -- good laser surrounding lesions OS (HST at 0130 with cuff of +SRF and +lattice within flap, scattered patches of lattice degeneration)  - both eyes with diffuse vitreous hemorrhages 2/2 RTs w/ bridging vessels, vitreous hemes improving OU  - s/p cryopexy OD  (08.21.20) to cauterize bridging vessels OD -- good cryo changes in place and bridging vessels sclerosed  - all retinal lesions now well treated with laser and cryo -- now just residual VH and vitreous floaters limiting vision OD -- cleared for f/u with Dr. Charlotte Sanes  - VH improving - discussed importance of VH precautions  -- minimize activities, keep head elevated, avoid ASA/NSAIDs/blood thinners as able  - f/u 6-8 weeks  6,7. History of high myopia s/p 8-cut Radial Keratotomy OU  - discussed increased risk of lattice degeneration, RT, RD in high myopes  - stable  - monitor  8. 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  - discussed diagnosis and progression  - under the expert management of Dr. Charlotte Sanes   - clear from a retina standpoint to proceed with cataract surgery when pt and surgeon are ready    9. Pseudophakia OD  - s/p CE/IOL (October 2020, [place])  - beautiful surgery, doing well  - monitor  10. Glaucoma Suspect OU  - IOP 18 OU  - +cupping  - +  family history of glaucoma  - expert management per Dr. Ellie Lunch  - cont brimonidine TID OU and Cosopt BID OU   Ophthalmic Meds Ordered this visit:  No orders of the defined types were placed in this encounter.      Return for f/u 6-8 weeks, VH OU, DFE, OCT.  There are no Patient Instructions on file for this visit.   Explained the diagnoses, plan, and follow up with the patient and they expressed understanding.  Patient expressed understanding of the importance of proper follow up care.   This document serves as a record of services personally performed by Gardiner Sleeper, MD, PhD. It was created on their behalf by Roselee Nova, COMT. The creation of this record is the provider's dictation and/or activities during the visit.  Electronically signed by: Roselee Nova, COMT 11/16/19 11:58 AM   This document serves as a record of services  personally performed by Gardiner Sleeper, MD, PhD. It was created on their behalf by Ernest Mallick, OA, an ophthalmic assistant. The creation of this record is the provider's dictation and/or activities during the visit.    Electronically signed by: Ernest Mallick, OA 11.25.2020 11:58 AM  Gardiner Sleeper, M.D., Ph.D. Diseases & Surgery of the Retina and [place] 11/15/2019   I have reviewed the above documentation for accuracy and completeness, and I agree with the above. Gardiner Sleeper, M.D., Ph.D. 11/16/19 11:58 AM   Abbreviations: M myopia (nearsighted); A astigmatism; H hyperopia (farsighted); P presbyopia; Mrx spectacle prescription;  CTL contact lenses; OD right eye; OS left eye; OU both eyes  XT exotropia; ET esotropia; PEK punctate epithelial keratitis; PEE punctate epithelial erosions; DES dry eye syndrome; MGD meibomian gland dysfunction; ATs artificial tears; PFAT's preservative free artificial tears; [place] nuclear sclerotic cataract; PSC posterior subcapsular cataract; ERM epi-retinal membrane; PVD posterior vitreous detachment; RD retinal detachment; DM diabetes mellitus; DR diabetic retinopathy; NPDR non-proliferative diabetic retinopathy; PDR proliferative diabetic retinopathy; CSME clinically significant macular edema; DME diabetic macular edema; dbh dot blot hemorrhages; CWS cotton wool spot; POAG primary open angle glaucoma; C/D cup-to-disc ratio; HVF humphrey visual field; GVF goldmann visual field; OCT optical coherence tomography; IOP intraocular pressure; BRVO Branch retinal vein occlusion; CRVO central retinal vein occlusion; CRAO central retinal artery occlusion; BRAO branch retinal artery occlusion; RT retinal tear; SB scleral buckle; PPV pars plana vitrectomy; VH Vitreous hemorrhage; PRP panretinal laser photocoagulation; IVK intravitreal kenalog; VMT vitreomacular traction; MH Macular hole;  NVD neovascularization of the disc; NVE  neovascularization elsewhere; AREDS age related eye disease study; ARMD age related macular degeneration; POAG primary open angle glaucoma; EBMD epithelial/anterior basement membrane dystrophy; ACIOL anterior chamber intraocular lens; IOL intraocular lens; PCIOL posterior chamber intraocular lens; Phaco/IOL phacoemulsification with intraocular lens placement; [place] photorefractive keratectomy; LASIK laser assisted in situ keratomileusis; HTN hypertension; DM diabetes mellitus; COPD chronic obstructive pulmonary disease

## 2019-11-14 NOTE — Progress Notes (Signed)
Patient ID: Bradley Watson, male   DOB: [birth year], 58 y.o.   MRN: [id number]  Virtual Visit via Video Note  I connected with Bradley Watson on 11/14/19 at  3:00 PM EST by a video enabled telemedicine application and verified that I am speaking with the correct person using two identifiers.  Location: Patient: at home Provider: at office   I discussed the limitations of evaluation and management by telemedicine and the availability of in person appointments. The patient expressed understanding and agreed to proceed.  History of Present Illness: Here with c/o persistent maybe worsening chest congestion and non bloody prod cough, now ongoing for about 6 wks for unclear reasons, has already had 2 course antibx. mucinex and claritin bu no help, in fact maybe worse last night with some rhoncorous type wheezing it seemed with lying down, overall has some mild sob/doe but no activity limiting. Denies fever, chills, was COVID neg 3 days ago, no recent cxr.  Pt denies chest pain, orthopnea, PND, increased LE swelling, palpitations, dizziness or syncope.  Pt denies polydipsia, polyuria Past Medical History:  Diagnosis Date  . Carotid stenosis    Pre-CABG Dopplers: RICA 40-59%;  Carotid US (05/2014):  R 8-11%; normal LICA  . Cataract    OU  . Childhood asthma   . Cluster headaches 1985   Mid 80's  . Coronary artery disease    a.  LHC 10/27/12 demonstrated pLAD 70%, AV CFX occluded, pOM 80%, dCFX collateralized the LAD and RCA, RCA without obstructive lesions, EF 50% with inferobasal HK;  b.  Echo 10/30/12: EF 91-47%, grade 1 diastolic dysfunction, mild LAE, normal LV wall motion;  c. s/p CABG 10/31/12 with Dr. Roxan Hockey:  L-LAD, left radial-OM;   b.  ETT-Nuc (05/2014):  + ECG changes, no ischemia; low risk  . Diverticulitis 02-2012   CT scan   . Fatty liver 02-2012   Mild- CT scan   . GERD (gastroesophageal reflux disease)   . HLD (hyperlipidemia)   . Hx of cardiovascular stress test     ETT-Myovew (05/2014):  + ECG changes with down-sloping ST depression; images with apical thinning but no ischemia, EF 52%; Low Risk  . Iron deficiency anemia 1980's   Past Surgical History:  Procedure Laterality Date  . CARDIAC CATHETERIZATION  10/27/2012  . cataract  2020  . CORONARY ARTERY BYPASS GRAFT  10/31/2012   Procedure: CORONARY ARTERY BYPASS GRAFTING (CABG);  Surgeon: Melrose Nakayama, MD;  Location: [facility];  Service: Open Heart Surgery;  Laterality: N/A;  . LEFT HEART CATHETERIZATION WITH CORONARY ANGIOGRAM N/A 10/27/2012   Procedure: LEFT HEART CATHETERIZATION WITH CORONARY ANGIOGRAM;  Surgeon: Hillary Bow, MD;  Location: [facility] CATH LAB;  Service: Cardiovascular;  Laterality: N/A;  . RADIAL ARTERY HARVEST  10/31/2012   Procedure: RADIAL ARTERY HARVEST;  Surgeon: Melrose Nakayama, MD;  Location: [facility];  Service: Open Heart Surgery;  Laterality: Left;  . [place]   bilaterally    reports that he has quit smoking. He has a 0.18 pack-year smoking history. He has never used smokeless tobacco. He reports current alcohol use. He reports that he does not use drugs. family history includes Diabetes Mellitus II in his paternal grandmother; Lung cancer in his father. Allergies  Allergen Reactions  . Penicillins Anxiety and Other (See Comments)    "Made me feel like gravity had increased and it was pulling me down" (10/27/2012) Did it involve swelling of the face/tongue/throat, SOB, or low BP?  No Did it involve sudden or severe rash/hives, skin peeling, or any reaction on the inside of your mouth or nose? No Did you need to seek medical attention at a hospital or doctor's office? No When did it last happen?Over 10 years ago If all above answers are "NO", may proceed with cephalosporin use.  Marland Kitchen Lisinopril Cough   Current Outpatient Medications on File Prior to Visit  Medication Sig Dispense Refill  . aspirin 81 MG EC tablet Take 1 tablet (81 mg total) by mouth daily.  1 tablet   . benzonatate (TESSALON) 200 MG capsule Take 1 capsule (200 mg total) by mouth 3 (three) times daily as needed for cough. 60 capsule 0  . brimonidine (ALPHAGAN) 0.2 % ophthalmic solution Place 1 drop into both eyes 3 (three) times daily. 10 mL 10  . hydrOXYzine (ATARAX/VISTARIL) 10 MG tablet Take 30-60 mg by mouth daily as needed for itching.     Marland Kitchen ibuprofen (ADVIL,MOTRIN) 800 MG tablet Take 1 tablet (800 mg total) by mouth every 8 (eight) hours as needed for up to 30 doses. 30 tablet 0  . levofloxacin (LEVAQUIN) 500 MG tablet Take 1 tablet (500 mg total) by mouth daily. 10 tablet 0  . loratadine (CLARITIN) 10 MG tablet Take 1 tablet (10 mg total) by mouth daily. 90 tablet 3  . omeprazole (PRILOSEC) 20 MG capsule Take 20 mg by mouth daily as needed (heartburn).     . prednisoLONE acetate (PRED FORTE) 1 % ophthalmic suspension Place 1 drop into the right eye 4 (four) times daily. 10 mL 0  . promethazine-codeine (PHENERGAN WITH CODEINE) 6.25-10 MG/5ML syrup Take 5-10 mLs by mouth every 4 (four) hours as needed. 300 mL 0  . pseudoephedrine (SUDAFED) 120 MG 12 hr tablet Take 1 tablet (120 mg total) by mouth 2 (two) times daily as needed for congestion. 60 tablet 0  . tacrolimus (PROTOPIC) 0.1 % ointment APP EXT TO THE SKIN BID    . tamsulosin (FLOMAX) 0.4 MG CAPS capsule Take 1 capsule (0.4 mg total) by mouth daily after supper. (Patient not taking: Reported on 10/24/2019) 20 capsule 0  . triamcinolone ointment (KENALOG) 0.1 % APPLY ON THE SKIN TWICE A DAY AS NEEDED     No current facility-administered medications on file prior to visit.     Observations/Objective: Alert, NAD, appropriate mood and affect, resps normal, cn 2-12 intact, moves all 4s, no visible rash or swelling Lab Results  Component Value Date   WBC 5.9 08/13/2019   HGB 12.7 (L) 08/13/2019   HCT 40.6 08/13/2019   PLT 202 08/13/2019   GLUCOSE 110 (H) 08/13/2019   CHOL 179 04/20/2017   TRIG 84.0 04/20/2017   HDL 44.40  04/20/2017   LDLCALC 118 (H) 04/20/2017   ALT 19 08/13/2019   AST 33 08/13/2019   NA 139 08/13/2019   K 4.3 08/13/2019   CL 107 08/13/2019   CREATININE 1.20 08/13/2019   BUN 16 08/13/2019   CO2 21 (L) 08/13/2019   TSH 1.56 09/29/2016   PSA 0.37 09/29/2016   INR 1.47 10/31/2012   HGBA1C 6.7 (H) 04/20/2017   Assessment and Plan: See notes  Follow Up Instructions: See notes   I discussed the assessment and treatment plan with the patient. The patient was provided an opportunity to ask questions and all were answered. The patient agreed with the plan and demonstrated an understanding of the instructions.   The patient was advised to call back or seek an in-person evaluation  if the symptoms worsen or if the condition fails to improve as anticipated.   Cathlean Cower, MD

## 2019-11-15 ENCOUNTER — Encounter: Payer: Self-pay | Admitting: Internal Medicine

## 2019-11-15 ENCOUNTER — Ambulatory Visit (INDEPENDENT_AMBULATORY_CARE_PROVIDER_SITE_OTHER): Payer: PRIVATE HEALTH INSURANCE | Admitting: Ophthalmology

## 2019-11-15 ENCOUNTER — Encounter (INDEPENDENT_AMBULATORY_CARE_PROVIDER_SITE_OTHER): Payer: Self-pay | Admitting: Ophthalmology

## 2019-11-15 DIAGNOSIS — H5213 Myopia, bilateral: Secondary | ICD-10-CM

## 2019-11-15 DIAGNOSIS — H33313 Horseshoe tear of retina without detachment, bilateral: Secondary | ICD-10-CM | POA: Diagnosis not present

## 2019-11-15 DIAGNOSIS — Z961 Presence of intraocular lens: Secondary | ICD-10-CM

## 2019-11-15 DIAGNOSIS — H4313 Vitreous hemorrhage, bilateral: Secondary | ICD-10-CM | POA: Diagnosis not present

## 2019-11-15 DIAGNOSIS — H52203 Unspecified astigmatism, bilateral: Secondary | ICD-10-CM

## 2019-11-15 DIAGNOSIS — H524 Presbyopia: Secondary | ICD-10-CM

## 2019-11-15 DIAGNOSIS — H35413 Lattice degeneration of retina, bilateral: Secondary | ICD-10-CM | POA: Diagnosis not present

## 2019-11-15 DIAGNOSIS — H3581 Retinal edema: Secondary | ICD-10-CM

## 2019-11-15 DIAGNOSIS — H25812 Combined forms of age-related cataract, left eye: Secondary | ICD-10-CM

## 2019-11-15 DIAGNOSIS — H3323 Serous retinal detachment, bilateral: Secondary | ICD-10-CM

## 2019-11-15 DIAGNOSIS — H40003 Preglaucoma, unspecified, bilateral: Secondary | ICD-10-CM

## 2019-11-15 DIAGNOSIS — Z9889 Other specified postprocedural states: Secondary | ICD-10-CM

## 2019-11-15 NOTE — Assessment & Plan Note (Signed)
stable overall by history and exam, recent data reviewed with pt, and pt to continue medical treatment as before,  to f/u any worsening symptoms or concerns. Lab Results  Component Value Date   HGBA1C 6.7 (H) 04/20/2017

## 2019-11-15 NOTE — Assessment & Plan Note (Signed)
Encouraged to follow BP at home with goal < 140/90

## 2019-11-15 NOTE — Assessment & Plan Note (Signed)
For tx as above 

## 2019-11-15 NOTE — Assessment & Plan Note (Addendum)
Etiology unclear but seems lower respiratory related by hx, now chronic persistent cant r/o bronchiectasis or other, ok for albuterol INH prn, predpac asd, septra course, and cough med prn; also for CT chest asap   Note:  Total time for pt hx, exam, review of record with pt in the room, determination of diagnoses and plan for further eval and tx is > 40 min, with over 50% spent in coordination and counseling of patient including the differential dx, tx, further evaluation and other management of cough, preDM, HTN, wheezing, dyspnea

## 2019-11-15 NOTE — Patient Instructions (Signed)
Please take all new medication as prescribed  Please continue all other medications as before, and refills have been done if requested.  Please have the pharmacy call with any other refills you may need.  Please continue your efforts at being more active, low cholesterol diet, and weight control.  Please keep your appointments with your specialists as you may have planned  You will be contacted regarding the referral for: CT scan

## 2019-11-15 NOTE — Assessment & Plan Note (Signed)
For predpac , albut inh prn

## 2019-11-16 ENCOUNTER — Encounter (INDEPENDENT_AMBULATORY_CARE_PROVIDER_SITE_OTHER): Payer: Self-pay | Admitting: Ophthalmology

## 2019-11-16 IMAGING — CT CT ABDOMEN AND PELVIS WITH CONTRAST
2 of 5 series · 17 of 46 positions shown, 19 images · IV contrast (omnipaque)
Comparison: None.

CLINICAL DATA: Right lower quadrant pain, nausea, vomiting

EXAM:
CT ABDOMEN AND PELVIS WITH CONTRAST
TECHNIQUE: Multidetector CT imaging of the abdomen and pelvis was performed
using the standard protocol following bolus administration of
intravenous contrast.
CONTRAST:  100mL OMNIPAQUE IOHEXOL 300 MG/ML  SOLN

[Series 2: axial st · axial · 0.80mm/px · z∈[-512,-42]mm · 14 of 106 slices shown, 16 images]
[im 6/106  soft-tissue]
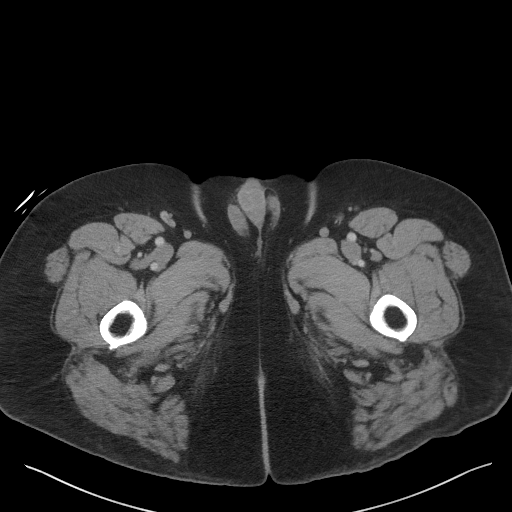
[im 6/106  bone]
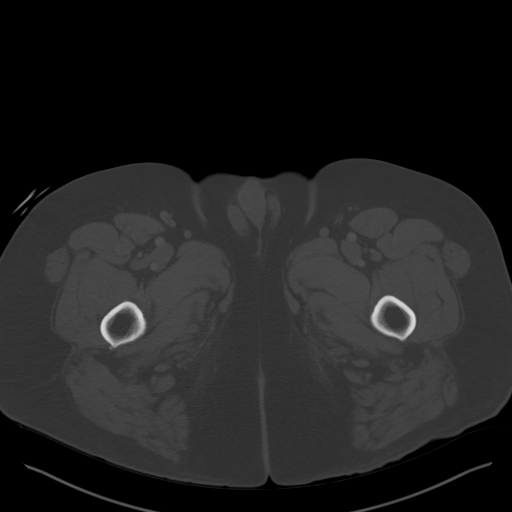
[im 12/106  soft-tissue]
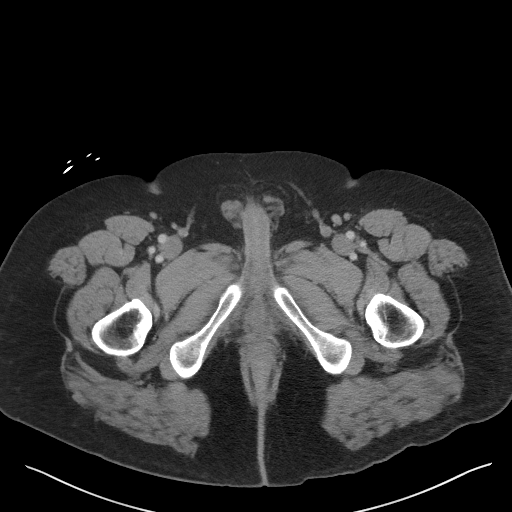
[im 24/106  soft-tissue]
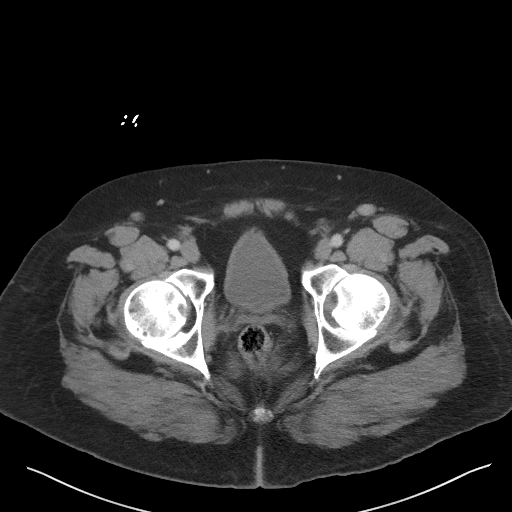
[im 30/106  soft-tissue]
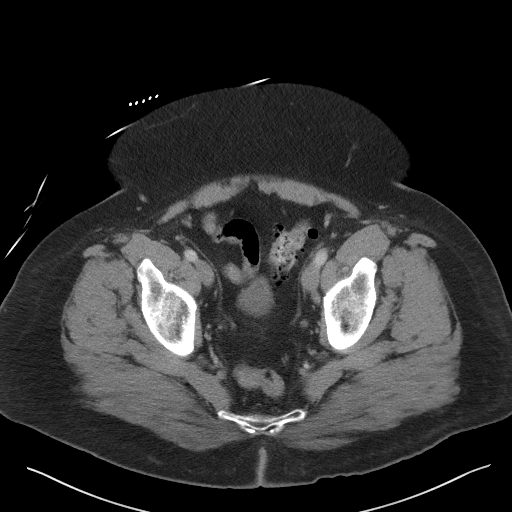
[im 36/106  soft-tissue]
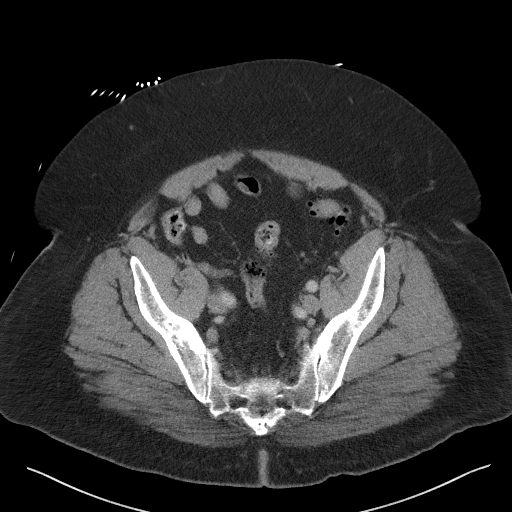
[im 41/106  soft-tissue]
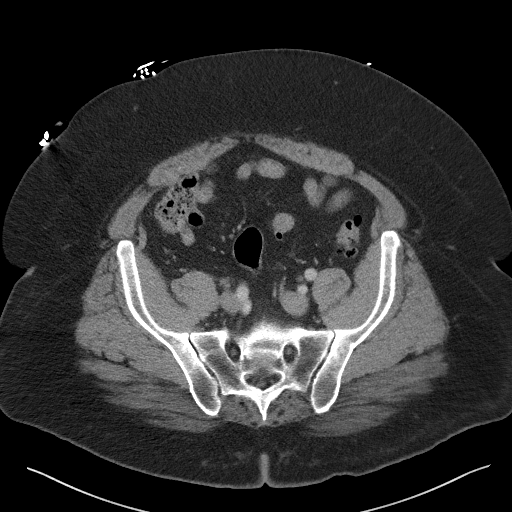
[im 47/106  soft-tissue]
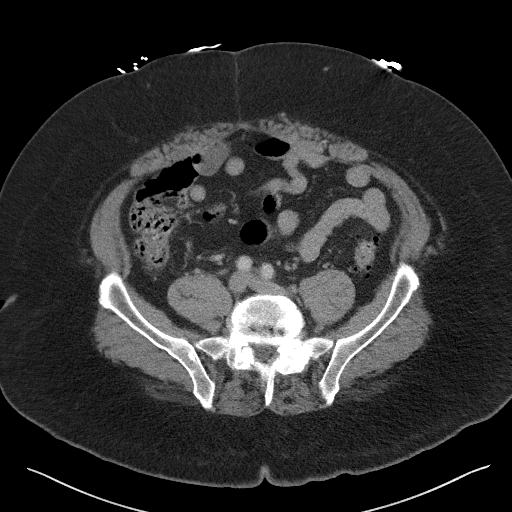
[im 59/106  soft-tissue]
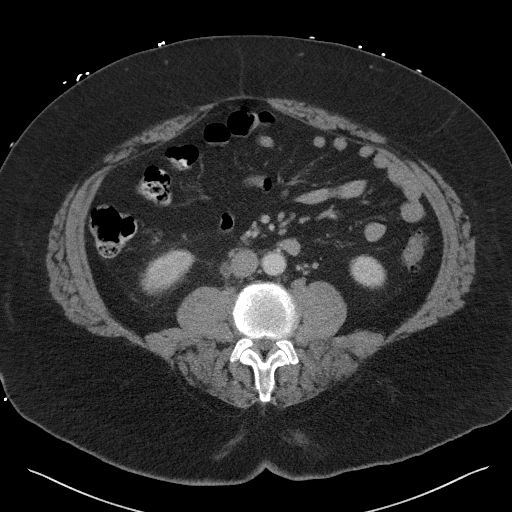
[im 65/106  soft-tissue]
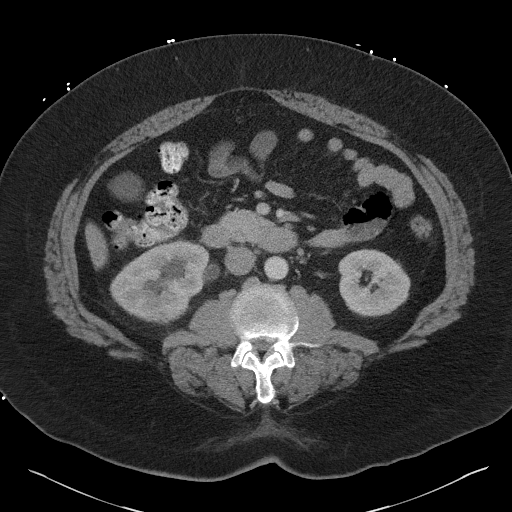
[im 65/106  bone]
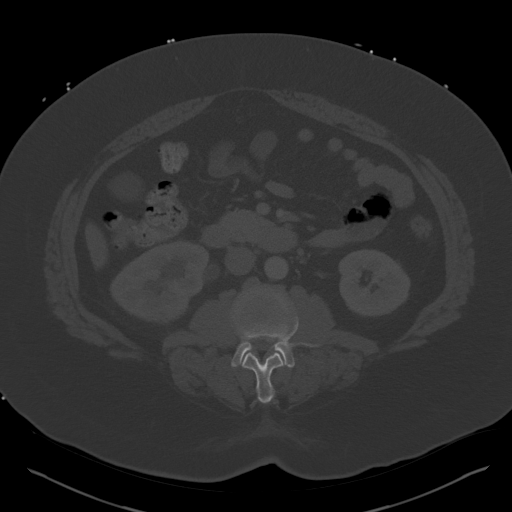
[im 71/106  soft-tissue]
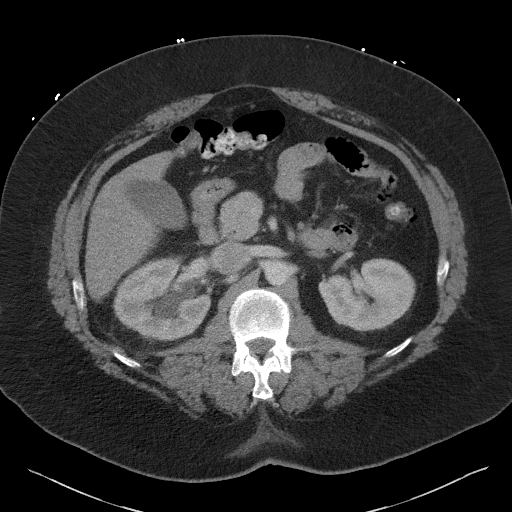
[im 76/106  soft-tissue]
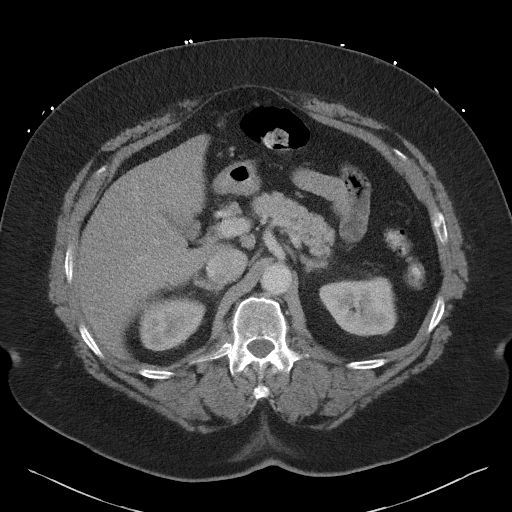
[im 82/106  soft-tissue]
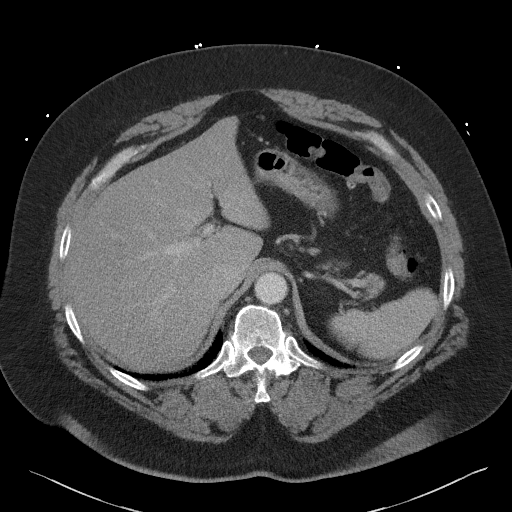
[im 94/106  soft-tissue]
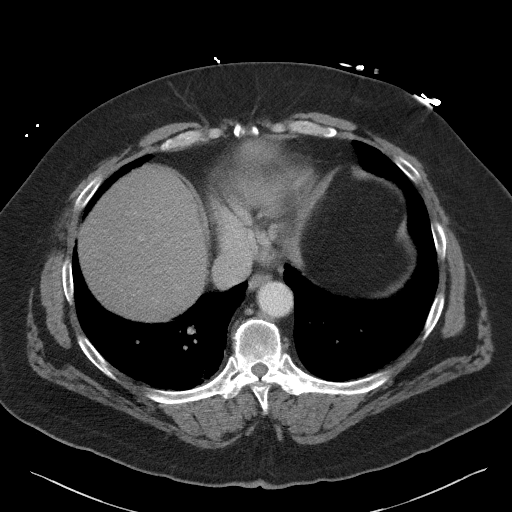
[im 100/106  soft-tissue]
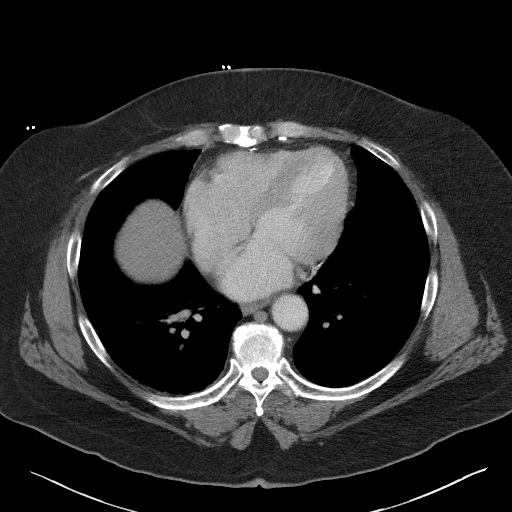

[Series 5: coronal st · coronal · 1.02mm/px · 3 of 109 slices shown]
[im 37/109  soft-tissue]
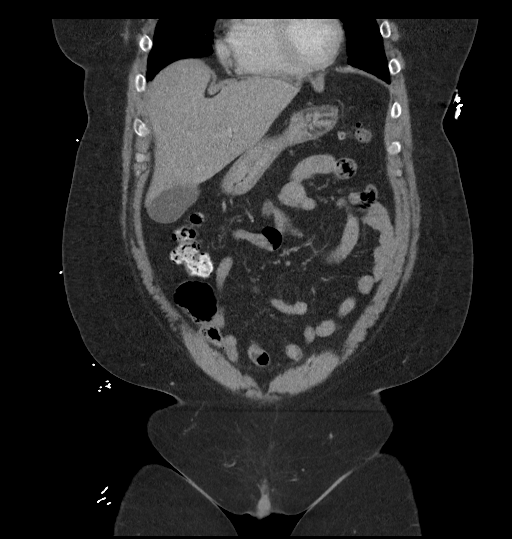
[im 49/109  soft-tissue]
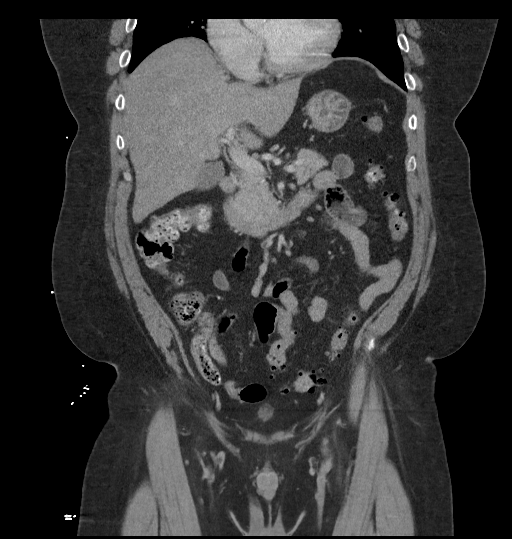
[im 61/109  soft-tissue]
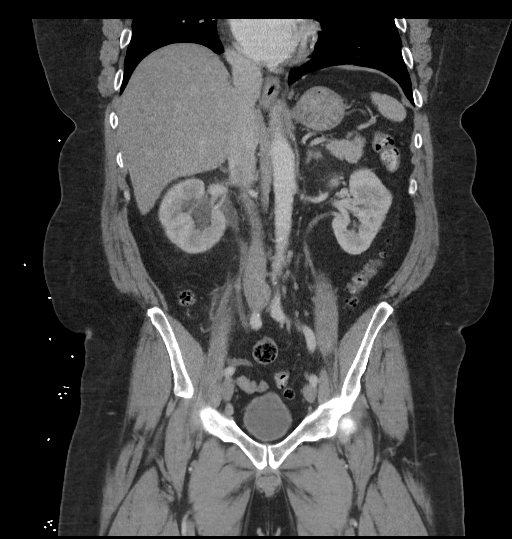

[17 of 46 positions shown; findings below may reference images not displayed]

FINDINGS: Lower chest: No acute abnormality.

Hepatobiliary: Diffuse low-density throughout the liver compatible
with fatty infiltration. No focal abnormality. Gallbladder
unremarkable.

Pancreas: No focal abnormality or ductal dilatation.

Spleen: No focal abnormality.  Normal size.

Adrenals/Urinary Tract: 4 mm proximal right ureteral stone with
moderate right hydronephrosis. 12 mm renal pelvic stone. No stones
or hydronephrosis on the left. Adrenal glands and urinary bladder
unremarkable.

Stomach/Bowel: Sigmoid and descending colonic diverticulosis. No
active diverticulitis. Normal appendix. Stomach, small bowel
unremarkable.

Vascular/Lymphatic: Aortic atherosclerosis. No enlarged abdominal or
pelvic lymph nodes.

Reproductive: Mildly prominent prostate with central calcifications.

Other: No free fluid or free air.

Musculoskeletal: No acute bony abnormality.
IMPRESSION: 4 mm proximal right ureteral stone with moderate right
hydronephrosis.

12 mm right renal pelvic stone.

Fatty infiltration of the liver.

Left colonic diverticulosis.  No active diverticulitis.

Aortic atherosclerosis.

## 2019-11-22 ENCOUNTER — Other Ambulatory Visit: Payer: Self-pay | Admitting: Internal Medicine

## 2019-11-22 ENCOUNTER — Ambulatory Visit (INDEPENDENT_AMBULATORY_CARE_PROVIDER_SITE_OTHER)
Admission: RE | Admit: 2019-11-22 | Discharge: 2019-11-22 | Disposition: A | Discharge status: Home / Self Care | Payer: PRIVATE HEALTH INSURANCE | Source: Ambulatory Visit | Attending: Internal Medicine | Admitting: Internal Medicine

## 2019-11-22 ENCOUNTER — Other Ambulatory Visit: Payer: Self-pay

## 2019-11-22 DIAGNOSIS — R0600 Dyspnea, unspecified: Secondary | ICD-10-CM | POA: Diagnosis not present

## 2019-11-28 LAB — HM DIABETES EYE EXAM

## 2019-11-29 ENCOUNTER — Ambulatory Visit
Admission: RE | Admit: 2019-11-29 | Discharge: 2019-11-29 | Disposition: A | Discharge status: Home / Self Care | Payer: PRIVATE HEALTH INSURANCE | Source: Ambulatory Visit | Attending: Internal Medicine | Admitting: Internal Medicine

## 2019-11-29 DIAGNOSIS — R0602 Shortness of breath: Secondary | ICD-10-CM

## 2019-12-05 ENCOUNTER — Telehealth: Payer: Self-pay | Admitting: Internal Medicine

## 2019-12-05 NOTE — Telephone Encounter (Signed)
I cannot discuss in depth his CT results by email  I did review, there are no acute problems, and can be further discussed at his next appt with Dr Ronnald Ramp if needed

## 2019-12-05 NOTE — Telephone Encounter (Signed)
Can you interpret the results for the CT scan that was ordered?

## 2019-12-05 NOTE — Telephone Encounter (Signed)
Patient is calling because he would like to talk to Dr. Ronnald Ramp or his CMA about his CT results. Please call patient back, thanks.

## 2019-12-05 NOTE — Telephone Encounter (Signed)
Not sure if you just want the patient to come in for an appointment to discuss his results. Please advise.

## 2019-12-06 ENCOUNTER — Other Ambulatory Visit: Payer: Self-pay | Admitting: Internal Medicine

## 2019-12-06 NOTE — Telephone Encounter (Signed)
Dr. Jenny Reichmann will have to take care of this  [place]

## 2019-12-11 NOTE — Telephone Encounter (Signed)
Spoke to pt. Pt stated that he was having sx that were a concern for cardiac issue. No chest pain but some mild sob. Covid tests were negative and pt is no longer having sx that are concerning at this time.   Pt instructed to go to ED if sx change or worsen. Pt informed to let us know if he develops any new sx.

## 2019-12-18 ENCOUNTER — Other Ambulatory Visit: Payer: Self-pay

## 2019-12-18 ENCOUNTER — Ambulatory Visit (INDEPENDENT_AMBULATORY_CARE_PROVIDER_SITE_OTHER): Payer: PRIVATE HEALTH INSURANCE | Admitting: Internal Medicine

## 2019-12-18 ENCOUNTER — Telehealth: Payer: Self-pay | Admitting: *Deleted

## 2019-12-18 ENCOUNTER — Encounter: Payer: Self-pay | Admitting: Internal Medicine

## 2019-12-18 VITALS — BP 162/102 | HR 76 | Temp 98.0°F | Resp 16 | Ht 72.0 in | Wt 282.0 lb

## 2019-12-18 DIAGNOSIS — E118 Type 2 diabetes mellitus with unspecified complications: Secondary | ICD-10-CM | POA: Diagnosis not present

## 2019-12-18 DIAGNOSIS — R0600 Dyspnea, unspecified: Secondary | ICD-10-CM | POA: Diagnosis not present

## 2019-12-18 DIAGNOSIS — I10 Essential (primary) hypertension: Secondary | ICD-10-CM | POA: Diagnosis not present

## 2019-12-18 DIAGNOSIS — J4541 Moderate persistent asthma with (acute) exacerbation: Secondary | ICD-10-CM

## 2019-12-18 DIAGNOSIS — D539 Nutritional anemia, unspecified: Secondary | ICD-10-CM

## 2019-12-18 DIAGNOSIS — E559 Vitamin D deficiency, unspecified: Secondary | ICD-10-CM

## 2019-12-18 DIAGNOSIS — Z0000 Encounter for general adult medical examination without abnormal findings: Secondary | ICD-10-CM

## 2019-12-18 DIAGNOSIS — D520 Dietary folate deficiency anemia: Secondary | ICD-10-CM

## 2019-12-18 DIAGNOSIS — R059 Cough, unspecified: Secondary | ICD-10-CM

## 2019-12-18 DIAGNOSIS — R195 Other fecal abnormalities: Secondary | ICD-10-CM | POA: Insufficient documentation

## 2019-12-18 DIAGNOSIS — R05 Cough: Secondary | ICD-10-CM | POA: Diagnosis not present

## 2019-12-18 DIAGNOSIS — I2510 Atherosclerotic heart disease of native coronary artery without angina pectoris: Secondary | ICD-10-CM | POA: Diagnosis not present

## 2019-12-18 DIAGNOSIS — E785 Hyperlipidemia, unspecified: Secondary | ICD-10-CM

## 2019-12-18 LAB — POCT EXHALED NITRIC OXIDE: FeNO level (ppb): 32

## 2019-12-18 MED ORDER — TRELEGY ELLIPTA 200-62.5-25 MCG/INH IN AEPB: 1.0000 | INHALATION_SPRAY | Freq: Every day | RESPIRATORY_TRACT | 1 refills | Status: DC

## 2019-12-18 MED ORDER — INDAPAMIDE 1.25 MG PO TABS: 1.2500 mg | ORAL_TABLET | Freq: Every day | ORAL | 0 refills | Status: DC

## 2019-12-18 MED ORDER — ASPIRIN 81 MG PO TBEC: 81.0000 mg | DELAYED_RELEASE_TABLET | Freq: Every day | ORAL | 1 refills | Status: DC

## 2019-12-18 MED ORDER — IRBESARTAN 150 MG PO TABS: 150.0000 mg | ORAL_TABLET | Freq: Every day | ORAL | 0 refills | Status: DC

## 2019-12-18 MED ORDER — ROSUVASTATIN CALCIUM 20 MG PO TABS: 20.0000 mg | ORAL_TABLET | Freq: Every day | ORAL | 1 refills | Status: DC

## 2019-12-18 MED ORDER — CARVEDILOL 3.125 MG PO TABS: 3.1250 mg | ORAL_TABLET | Freq: Two times a day (BID) | ORAL | 0 refills | Status: DC

## 2019-12-18 NOTE — Telephone Encounter (Signed)
Troponin 1 (HS) can not be drawn this late 2 a free standing [facility] lab. There was no way to get it to our lab this late. The last courier pickup is at 4:50.  Please reorder test.   FYI: Pt has to come back to drop off urine specimen & he could possibly have it drawn at that time if notifed.

## 2019-12-18 NOTE — Progress Notes (Signed)
Subjective:  Patient ID: Bradley Watson, male    DOB: 02/26/1961  Age: 58 y.o. MRN: [id number]  CC: Annual Exam, Cough, Hypertension, Hyperlipidemia, Diabetes, and Anemia  This visit occurred during the SARS-CoV-2 public health emergency.  Safety protocols were in place, including screening questions prior to the visit, additional usage of staff PPE, and extensive cleaning of exam room while observing appropriate contact time as indicated for disinfecting solutions.    HPI Bradley Watson presents for a CPX.  He has had an intermittent cough for several months.  He has been treated with multiple antibiotics, a course of prednisone, and a cough suppressant.  He tells me that prednisone has helped the most.  He was given a prescription for albuterol but has not been using it.  A recent chest CT scan was unremarkable.  The cough has not recently been productive.  He also complains of wheezing, shortness of breath, and DOE.  Additionally, he complains of chest pain which he describes as an intermittent burning sensation that occurs at rest and with exertion.  He complains of heartburn but denies odynophagia, dysphagia, loss of appetite, or weight loss.  The heartburn is well controlled with omeprazole.  Outpatient Medications Prior to Visit  Medication Sig Dispense Refill  . albuterol (VENTOLIN HFA) 108 (90 Base) MCG/ACT inhaler Inhale 2 puffs into the lungs every 6 (six) hours as needed for wheezing or shortness of breath. 18 g 5  . brimonidine (ALPHAGAN) 0.2 % ophthalmic solution Place 1 drop into both eyes 3 (three) times daily. 10 mL 10  . hydrOXYzine (ATARAX/VISTARIL) 10 MG tablet Take 30-60 mg by mouth daily as needed for itching.     . loratadine (CLARITIN) 10 MG tablet Take 1 tablet (10 mg total) by mouth daily. 90 tablet 3  . omeprazole (PRILOSEC) 20 MG capsule Take 20 mg by mouth daily as needed (heartburn).     . prednisoLONE acetate (PRED FORTE) 1 % ophthalmic suspension Place 1 drop  into the right eye 4 (four) times daily. 10 mL 0  . tacrolimus (PROTOPIC) 0.1 % ointment APP EXT TO THE SKIN BID    . tamsulosin (FLOMAX) 0.4 MG CAPS capsule Take 1 capsule (0.4 mg total) by mouth daily after supper. 20 capsule 0  . triamcinolone ointment (KENALOG) 0.1 % APPLY ON THE SKIN TWICE A DAY AS NEEDED    . aspirin 81 MG EC tablet Take 1 tablet (81 mg total) by mouth daily. 1 tablet   . ibuprofen (ADVIL,MOTRIN) 800 MG tablet Take 1 tablet (800 mg total) by mouth every 8 (eight) hours as needed for up to 30 doses. 30 tablet 0  . pseudoephedrine (SUDAFED) 120 MG 12 hr tablet Take 1 tablet (120 mg total) by mouth 2 (two) times daily as needed for congestion. 60 tablet 0  . benzonatate (TESSALON) 200 MG capsule Take 1 capsule (200 mg total) by mouth 3 (three) times daily as needed for cough. 60 capsule 0  . levofloxacin (LEVAQUIN) 500 MG tablet Take 1 tablet (500 mg total) by mouth daily. 10 tablet 0  . predniSONE (DELTASONE) 10 MG tablet 3 tabs by mouth per day for 3 days,2tabs per day for 3 days,1tab per day for 3 days 18 tablet 0  . promethazine-codeine (PHENERGAN WITH CODEINE) 6.25-10 MG/5ML syrup Take 5-10 mLs by mouth every 4 (four) hours as needed. 300 mL 0  . sulfamethoxazole-trimethoprim (BACTRIM DS) 800-160 MG tablet Take 1 tablet by mouth 2 (two) times daily. 20 tablet  0   No facility-administered medications prior to visit.    ROS Review of Systems  Constitutional: Negative.  Negative for appetite change, diaphoresis, fatigue and unexpected weight change.  HENT: Negative.  Negative for sore throat, trouble swallowing and voice change.   Eyes: Negative.   Respiratory: Positive for cough, shortness of breath and wheezing. Negative for choking, chest tightness and stridor.   Cardiovascular: Positive for chest pain. Negative for leg swelling.  Gastrointestinal: Negative for abdominal pain, anal bleeding, blood in stool, constipation, diarrhea, nausea and vomiting.  Endocrine:  Negative.   Genitourinary: Negative.  Negative for difficulty urinating, dysuria, hematuria, penile swelling, scrotal swelling, testicular pain and urgency.  Musculoskeletal: Negative.  Negative for myalgias.  Skin: Negative.   Neurological: Negative.  Negative for dizziness, weakness, light-headedness and numbness.  Hematological: Negative for adenopathy. Does not bruise/bleed easily.  Psychiatric/Behavioral: Negative.     Objective:  BP (!) 162/102 (BP Location: Left Arm, Patient Position: Sitting, Cuff Size: Large)   Pulse 76   Temp 98 F (36.7 C) (Oral)   Resp 16   Ht 6' (1.829 m)   Wt 282 lb (127.9 kg)   SpO2 96%   BMI 38.25 kg/m   BP Readings from Last 3 Encounters:  12/18/19 (!) 162/102  08/13/19 (!) 163/103  02/10/19 (!) 187/99    Wt Readings from Last 3 Encounters:  12/18/19 282 lb (127.9 kg)  08/13/19 280 lb (127 kg)  02/10/19 278 lb (126.1 kg)    Physical Exam Vitals reviewed.  Constitutional:      General: He is not in acute distress.    Appearance: He is obese. He is not ill-appearing, toxic-appearing or diaphoretic.  HENT:     Nose: Nose normal.     Mouth/Throat:     Mouth: Mucous membranes are moist.  Eyes:     General: No scleral icterus.    Conjunctiva/sclera: Conjunctivae normal.  Cardiovascular:     Rate and Rhythm: Normal rate and regular rhythm.     Heart sounds: Normal heart sounds, S1 normal and S2 normal. No murmur. No friction rub. No gallop.      Comments: EKG ---  NSR, no LVH or ischemia. Normal EKG. Pulmonary:     Effort: Pulmonary effort is normal. No tachypnea or accessory muscle usage.     Breath sounds: Normal breath sounds. No decreased breath sounds, wheezing, rhonchi or rales.  Abdominal:     General: Abdomen is protuberant. Bowel sounds are normal. There is no distension.     Palpations: Abdomen is soft. There is no hepatomegaly or splenomegaly.     Tenderness: There is no abdominal tenderness.     Hernia: No hernia is  present. There is no hernia in the left inguinal area or right inguinal area.  Genitourinary:    Pubic Area: No rash.      Penis: Normal and circumcised. No discharge, swelling or lesions.      Testes: Normal.        Right: Mass, tenderness or swelling not present.        Left: Mass, tenderness or swelling not present.     Epididymis:     Right: Normal. Not inflamed or enlarged. No mass or tenderness.     Left: Not enlarged. No mass or tenderness.     Prostate: Normal. Not enlarged, not tender and no nodules present.     Rectum: Guaiac result positive. Internal hemorrhoid present. No mass, tenderness, anal fissure or external hemorrhoid. Normal anal tone.  Musculoskeletal:        General: No swelling.     Cervical back: Neck supple.     Right lower leg: No edema.     Left lower leg: No edema.  Lymphadenopathy:     Cervical: No cervical adenopathy.     Lower Body: No right inguinal adenopathy. No left inguinal adenopathy.  Skin:    General: Skin is warm and dry.  Neurological:     General: No focal deficit present.     Mental Status: He is alert and oriented to person, place, and time.  Psychiatric:        Mood and Affect: Mood normal.        Behavior: Behavior normal.        Thought Content: Thought content normal.        Judgment: Judgment normal.     Lab Results  Component Value Date   WBC 5.2 12/18/2019   HGB 12.5 (L) 12/18/2019   HCT 38.9 (L) 12/18/2019   PLT 255.0 12/18/2019   GLUCOSE 111 (H) 12/18/2019   CHOL 320 (H) 12/18/2019   TRIG 160.0 (H) 12/18/2019   HDL 51.60 12/18/2019   LDLCALC 236 (H) 12/18/2019   ALT 19 08/13/2019   AST 33 08/13/2019   NA 135 12/18/2019   K 3.8 12/18/2019   CL 102 12/18/2019   CREATININE 1.16 12/18/2019   BUN 15 12/18/2019   CO2 28 12/18/2019   TSH 1.94 12/18/2019   PSA 1.1 12/18/2019   INR 1.47 10/31/2012   HGBA1C 6.0 12/18/2019   MICROALBUR <0.7 12/20/2019    CT Chest Wo Contrast  Result Date: 11/29/2019 CLINICAL DATA:   Shortness of breath and productive cough 2 months. Patient has been on antibiotics without improvement. Negative COVID-19 test. EXAM: CT CHEST WITHOUT CONTRAST TECHNIQUE: Multidetector CT imaging of the chest was performed following the standard protocol without IV contrast. COMPARISON:  None. FINDINGS: Cardiovascular: Heart is normal in size. Calcified plaque is present over the left anterior descending and lateral circumflex coronary arteries. Previous median sternotomy. Thoracic aorta is normal caliber. Remaining vascular structures are unremarkable. Mediastinum/Nodes: No mediastinal or hilar adenopathy. Remaining mediastinal structures are normal. Lungs/Pleura: Lungs are adequately inflated with minimal linear density over the posterior left upper lobe abutting the major fissure likely atelectasis/scarring. 2 mm subpleural nodule over the anterolateral left upper lobe. No focal lobar consolidation or effusion. Airways are normal. Upper Abdomen: No acute findings. Minimal diverticulosis of the colon. Musculoskeletal: Mild degenerative change of the spine IMPRESSION: 1. No acute cardiopulmonary disease. Minimal linear atelectasis/scarring in the posterior left upper lobe abutting the major fissure. 2.  Mild atherosclerotic coronary artery disease. 3.  Mild colonic diverticulosis. 2. Electronically Signed   By: Elberta Fortis M.D.   On: 11/29/2019 13:38    Assessment & Plan:   Quame was seen today for annual exam, cough, hypertension, hyperlipidemia, diabetes and anemia.  Diagnoses and all orders for this visit:  Atherosclerosis of native coronary artery of native heart without angina pectoris - He has symptoms that are suspicious for angina.  I will continue to work on risk factor modifications.  His EKG is normal today but I do not think he can do a treadmill test.  I recommended that he undergo a myocardial perfusion imaging to screen for ischemia. -     aspirin 81 MG EC tablet; Take 1 tablet (81 mg  total) by mouth daily. -     rosuvastatin (CRESTOR) 20 MG tablet; Take 1 tablet (  20 mg total) by mouth daily. -     carvedilol (COREG) 3.125 MG tablet; Take 1 tablet (3.125 mg total) by mouth 2 (two) times daily with a meal. -     irbesartan (AVAPRO) 150 MG tablet; Take 1 tablet (150 mg total) by mouth daily. -     EKG 12-Lead -     Cancel: Troponin I (High Sensitivity) -     Myocardial Perfusion Imaging; Future  Dyspnea, unspecified type- See above -     CBC with Differential -     Cancel: Troponin I (High Sensitivity) -     Myocardial Perfusion Imaging; Future  Routine general medical examination at a health care facility- Exam completed, labs reviewed, vaccines reviewed and updated, he is referred for colonoscopy for colon cancer screening, patient education was given. -     Lipid panel -     Cancel: PSA -     PSA  Deficiency anemia- His H&H have worsened.  I will screen him for vitamin deficiencies. -     CBC with Differential -     B12 -     IBC panel -     Ferritin -     Folate -     Vitamin B1  Type II diabetes mellitus with manifestations ([place])- His A1c is at 6.0.  His blood sugars are adequately well controlled. -     Basic metabolic panel -     Cancel: Urinalysis, Routine w reflex microscopic -     Hemoglobin A1c -     Cancel: Microalbumin / creatinine urine ratio -     HM Diabetes Foot Exam -     irbesartan (AVAPRO) 150 MG tablet; Take 1 tablet (150 mg total) by mouth daily. -     Urinalysis, Routine w reflex microscopic; Future -     Microalbumin / creatinine urine ratio; Future -     Microalbumin / creatinine urine ratio -     Urinalysis, Routine w reflex microscopic  Hyperlipidemia LDL goal <70- He has not achieved his LDL goal.  I have asked him to start high intensity statin therapy. -     TSH -     rosuvastatin (CRESTOR) 20 MG tablet; Take 1 tablet (20 mg total) by mouth daily.  Essential hypertension- His blood pressure is not adequately well controlled.  I  recommended that he start the combination of indapamide, carvedilol, and irbesartan. -     TSH -     Cancel: Urinalysis, Routine w reflex microscopic -     Vitamin D 25 hydroxy -     indapamide (LOZOL) 1.25 MG tablet; Take 1 tablet (1.25 mg total) by mouth daily. -     carvedilol (COREG) 3.125 MG tablet; Take 1 tablet (3.125 mg total) by mouth 2 (two) times daily with a meal. -     irbesartan (AVAPRO) 150 MG tablet; Take 1 tablet (150 mg total) by mouth daily. -     EKG 12-Lead  Cough- His FeNO score is mildly elevated consistent with asthma. -     POCT EXHALED NITRIC OXIDE  Occult blood in stools -     Ambulatory referral to Gastroenterology  Moderate persistent asthma with acute exacerbation- I gave him a sample of Trelegy and showed him how to use it.  He demonstrated proficiency with its use. -     Fluticasone-Umeclidin-Vilant (TRELEGY ELLIPTA) 200-62.5-25 MCG/INH AEPB; Inhale 1 puff into the lungs daily.  Dietary folate deficiency anemia -  folic acid (FOLVITE) 1 MG tablet; Take 1 tablet (1 mg total) by mouth daily.  Vitamin D deficiency disease -     Cholecalciferol 1.25 MG (50000 UT) capsule; Take 1 capsule (50,000 Units total) by mouth once a week.   I have discontinued Janetta Hora. [place]'s ibuprofen, benzonatate, levofloxacin, promethazine-codeine, pseudoephedrine, sulfamethoxazole-trimethoprim, and predniSONE. I am also having him start on rosuvastatin, indapamide, carvedilol, irbesartan, Trelegy Ellipta, folic acid, and Cholecalciferol. Additionally, I am having him maintain his omeprazole, hydrOXYzine, triamcinolone ointment, prednisoLONE acetate, tacrolimus, brimonidine, tamsulosin, loratadine, albuterol, and aspirin.  Meds ordered this encounter  Medications  . aspirin 81 MG EC tablet    Sig: Take 1 tablet (81 mg total) by mouth daily.    Dispense:  90 tablet    Refill:  1  . rosuvastatin (CRESTOR) 20 MG tablet    Sig: Take 1 tablet (20 mg total) by mouth daily.     Dispense:  90 tablet    Refill:  1  . indapamide (LOZOL) 1.25 MG tablet    Sig: Take 1 tablet (1.25 mg total) by mouth daily.    Dispense:  90 tablet    Refill:  0  . carvedilol (COREG) 3.125 MG tablet    Sig: Take 1 tablet (3.125 mg total) by mouth 2 (two) times daily with a meal.    Dispense:  180 tablet    Refill:  0  . irbesartan (AVAPRO) 150 MG tablet    Sig: Take 1 tablet (150 mg total) by mouth daily.    Dispense:  90 tablet    Refill:  0  . Fluticasone-Umeclidin-Vilant (TRELEGY ELLIPTA) 200-62.5-25 MCG/INH AEPB    Sig: Inhale 1 puff into the lungs daily.    Dispense:  120 each    Refill:  1  . folic acid (FOLVITE) 1 MG tablet    Sig: Take 1 tablet (1 mg total) by mouth daily.    Dispense:  90 tablet    Refill:  1  . Cholecalciferol 1.25 MG (50000 UT) capsule    Sig: Take 1 capsule (50,000 Units total) by mouth once a week.    Dispense:  12 capsule    Refill:  1     Follow-up: Return in about 6 weeks (around 01/29/2020).  Sanda Linger, MD

## 2019-12-18 NOTE — Patient Instructions (Signed)

## 2019-12-19 ENCOUNTER — Encounter: Payer: Self-pay | Admitting: Internal Medicine

## 2019-12-19 DIAGNOSIS — E559 Vitamin D deficiency, unspecified: Secondary | ICD-10-CM | POA: Insufficient documentation

## 2019-12-19 DIAGNOSIS — D520 Dietary folate deficiency anemia: Secondary | ICD-10-CM | POA: Insufficient documentation

## 2019-12-19 LAB — FERRITIN: Ferritin: 536.8 ng/mL — ABNORMAL HIGH (ref 22.0–322.0)

## 2019-12-19 LAB — CBC WITH DIFFERENTIAL/PLATELET
Basophils Absolute: 0 10*3/uL (ref 0.0–0.1)
Basophils Relative: 0.8 % (ref 0.0–3.0)
Eosinophils Absolute: 0.1 10*3/uL (ref 0.0–0.7)
Eosinophils Relative: 2.9 % (ref 0.0–5.0)
HCT: 38.9 % — ABNORMAL LOW (ref 39.0–52.0)
Hemoglobin: 12.5 g/dL — ABNORMAL LOW (ref 13.0–17.0)
Lymphocytes Relative: 44.8 % (ref 12.0–46.0)
Lymphs Abs: 2.3 10*3/uL (ref 0.7–4.0)
MCHC: 32.2 g/dL (ref 30.0–36.0)
MCV: 95.1 fl (ref 78.0–100.0)
Monocytes Absolute: 0.4 10*3/uL (ref 0.1–1.0)
Monocytes Relative: 8.3 % (ref 3.0–12.0)
Neutro Abs: 2.3 10*3/uL (ref 1.4–7.7)
Neutrophils Relative %: 43.2 % (ref 43.0–77.0)
Platelets: 255 10*3/uL (ref 150.0–400.0)
RBC: 4.09 Mil/uL — ABNORMAL LOW (ref 4.22–5.81)
RDW: 16.2 % — ABNORMAL HIGH (ref 11.5–15.5)
WBC: 5.2 10*3/uL (ref 4.0–10.5)

## 2019-12-19 LAB — HEMOGLOBIN A1C: Hgb A1c MFr Bld: 6 % (ref 4.6–6.5)

## 2019-12-19 LAB — BASIC METABOLIC PANEL
BUN: 15 mg/dL (ref 6–23)
CO2: 28 mEq/L (ref 19–32)
Calcium: 9 mg/dL (ref 8.4–10.5)
Chloride: 102 mEq/L (ref 96–112)
Creatinine, Ser: 1.16 mg/dL (ref 0.40–1.50)
GFR: 77.47 mL/min (ref >60.00)
Glucose, Bld: 111 mg/dL — ABNORMAL HIGH (ref 70–99)
Potassium: 3.8 mEq/L (ref 3.5–5.1)
Sodium: 135 mEq/L (ref 135–145)

## 2019-12-19 LAB — LIPID PANEL
Cholesterol: 320 mg/dL — ABNORMAL HIGH (ref 0–200)
HDL: 51.6 mg/dL (ref >39.00)
LDL Cholesterol: 236 mg/dL — ABNORMAL HIGH (ref 0–99)
NonHDL: 268.35
Total CHOL/HDL Ratio: 6
Triglycerides: 160 mg/dL — ABNORMAL HIGH (ref 0.0–149.0)
VLDL: 32 mg/dL (ref 0.0–40.0)

## 2019-12-19 LAB — IBC PANEL
Iron: 54 ug/dL (ref 42–165)
Saturation Ratios: 15.8 % — ABNORMAL LOW (ref 20.0–50.0)
Transferrin: 244 mg/dL (ref 212.0–360.0)

## 2019-12-19 LAB — VITAMIN B12: Vitamin B-12: 929 pg/mL — ABNORMAL HIGH (ref 211–911)

## 2019-12-19 LAB — FOLATE: Folate: 4.1 ng/mL — ABNORMAL LOW (ref >5.9)

## 2019-12-19 LAB — TSH: TSH: 1.94 u[IU]/mL (ref 0.35–4.50)

## 2019-12-19 LAB — VITAMIN D 25 HYDROXY (VIT D DEFICIENCY, FRACTURES): VITD: 10.48 ng/mL — ABNORMAL LOW (ref 30.00–100.00)

## 2019-12-19 MED ORDER — FOLIC ACID 1 MG PO TABS: 1.0000 mg | ORAL_TABLET | Freq: Every day | ORAL | 1 refills | Status: DC

## 2019-12-19 MED ORDER — CHOLECALCIFEROL 1.25 MG (50000 UT) PO CAPS: 50000.0000 [IU] | ORAL_CAPSULE | ORAL | 1 refills | Status: DC

## 2019-12-19 NOTE — Telephone Encounter (Signed)
Yes he had everything drawn but the Troponin. The blood was picked up today around 12:30. He also dropped off his urine during lunch today.

## 2019-12-20 ENCOUNTER — Encounter: Payer: Self-pay | Admitting: Internal Medicine

## 2019-12-20 LAB — MICROALBUMIN / CREATININE URINE RATIO
Creatinine,U: 132.2 mg/dL
Microalb Creat Ratio: 0.5 mg/g (ref 0.0–30.0)
Microalb, Ur: <0.7 mg/dL (ref 0.0–1.9)

## 2019-12-20 LAB — URINALYSIS, ROUTINE W REFLEX MICROSCOPIC
Bilirubin Urine: NEGATIVE
Hgb urine dipstick: NEGATIVE
Ketones, ur: NEGATIVE
Leukocytes,Ua: NEGATIVE
Nitrite: NEGATIVE
RBC / HPF: NONE SEEN (ref 0–?)
Specific Gravity, Urine: 1.02 (ref 1.000–1.030)
Total Protein, Urine: NEGATIVE
Urine Glucose: NEGATIVE
Urobilinogen, UA: 0.2 (ref 0.0–1.0)
pH: 5.5 (ref 5.0–8.0)

## 2019-12-20 LAB — PSA: PSA: 1.1 ng/mL (ref <=4.0)

## 2019-12-23 LAB — VITAMIN B1: Vitamin B1 (Thiamine): <6 nmol/L — ABNORMAL LOW (ref 8–30)

## 2019-12-24 ENCOUNTER — Encounter: Payer: Self-pay | Admitting: Internal Medicine

## 2019-12-24 ENCOUNTER — Other Ambulatory Visit: Payer: Self-pay | Admitting: Internal Medicine

## 2019-12-24 DIAGNOSIS — E519 Thiamine deficiency, unspecified: Secondary | ICD-10-CM | POA: Insufficient documentation

## 2019-12-24 MED ORDER — THIAMINE HCL 100 MG PO TABS: 100.0000 mg | ORAL_TABLET | ORAL | 1 refills | Status: DC

## 2019-12-25 ENCOUNTER — Telehealth: Payer: Self-pay | Admitting: Internal Medicine

## 2019-12-25 NOTE — Telephone Encounter (Signed)
Spoke to pt and gave him cardiology's number to call to schedule stress test

## 2019-12-25 NOTE — Telephone Encounter (Signed)
Referral Request - Has patient seen PCP for this complaint? Yes.   *If NO, is insurance requiring patient see PCP for this issue before PCP can refer them? Referral for which specialty: radiology  Preferred provider/office: nuclear test  Reason for referral: Patient would like to speak to someone about scheduling a nuclear test. Please call patient back, thanks.

## 2020-01-03 ENCOUNTER — Telehealth: Payer: Self-pay | Admitting: Internal Medicine

## 2020-01-03 NOTE — Telephone Encounter (Signed)
Migraine at the temple of right eye. They last 30-45 minutes. Pt stated that he has had a migraine everyday for the last 5-6 days. He also stated that he has changed his diet more so since the last visit including cutting sugars, fats and salt.   The pt stated he was concerned that one of the these medications may be causing his migraines: carvedilol, vit d, trelegy, folic acid, indapamide, irbesartan  Do you think any of those medications could be causing pts migraines?

## 2020-01-03 NOTE — Telephone Encounter (Signed)
Spoke with pt, he said he would come sometime tomorrow to pick up the samples.

## 2020-01-03 NOTE — Telephone Encounter (Signed)
Pt called and stated that he would like a call from the office regarding medications. Pt states that he has had some migraines. Pt believes  migraines are related to new medications that he is taking.  Pt would like a call from the office because he said he has trouble with mychart. Please advise

## 2020-01-04 NOTE — Telephone Encounter (Signed)
Per PCP, dispense Nurtec. Pt picked up Nurtec.

## 2020-01-10 ENCOUNTER — Encounter (INDEPENDENT_AMBULATORY_CARE_PROVIDER_SITE_OTHER): Payer: Self-pay

## 2020-01-10 ENCOUNTER — Encounter (INDEPENDENT_AMBULATORY_CARE_PROVIDER_SITE_OTHER): Payer: PRIVATE HEALTH INSURANCE | Admitting: Ophthalmology

## 2020-01-11 ENCOUNTER — Telehealth (HOSPITAL_COMMUNITY): Payer: Self-pay

## 2020-01-11 NOTE — Telephone Encounter (Signed)
Spoke with the patient, instructions given. He stated that he would be here for his test. Asked to call back with any questions. S.Novi Calia EMTP 

## 2020-01-16 ENCOUNTER — Encounter: Payer: Self-pay | Admitting: Physician Assistant

## 2020-01-16 ENCOUNTER — Encounter: Payer: Self-pay | Admitting: Family Medicine

## 2020-01-16 ENCOUNTER — Other Ambulatory Visit: Payer: Self-pay

## 2020-01-16 ENCOUNTER — Ambulatory Visit (HOSPITAL_COMMUNITY): Payer: PRIVATE HEALTH INSURANCE | Attending: Cardiovascular Disease

## 2020-01-16 DIAGNOSIS — I2510 Atherosclerotic heart disease of native coronary artery without angina pectoris: Secondary | ICD-10-CM | POA: Diagnosis present

## 2020-01-16 DIAGNOSIS — R0600 Dyspnea, unspecified: Secondary | ICD-10-CM | POA: Diagnosis present

## 2020-01-16 MED ORDER — TECHNETIUM TC 99M TETROFOSMIN IV KIT
31.7000 | PACK | Freq: Once | INTRAVENOUS | Status: AC | PRN
  Administered 2020-01-16: 31.7 via INTRAVENOUS
  Filled 2020-01-16: qty 32

## 2020-01-16 MED ORDER — REGADENOSON 0.4 MG/5ML IV SOLN
0.4000 mg | Freq: Once | INTRAVENOUS | Status: AC
  Administered 2020-01-16: 0.4 mg via INTRAVENOUS

## 2020-01-17 ENCOUNTER — Other Ambulatory Visit: Payer: Self-pay | Admitting: Internal Medicine

## 2020-01-17 ENCOUNTER — Encounter: Payer: Self-pay | Admitting: Internal Medicine

## 2020-01-17 ENCOUNTER — Ambulatory Visit (HOSPITAL_COMMUNITY): Payer: PRIVATE HEALTH INSURANCE | Attending: Internal Medicine

## 2020-01-17 DIAGNOSIS — R9430 Abnormal result of cardiovascular function study, unspecified: Secondary | ICD-10-CM | POA: Insufficient documentation

## 2020-01-17 LAB — MYOCARDIAL PERFUSION IMAGING
LV dias vol: 117 mL (ref 62–150)
LV sys vol: 71 mL
Peak HR: 93 {beats}/min
Rest HR: 67 {beats}/min
SDS: 1
SRS: 0
SSS: 1
TID: 1.07

## 2020-01-17 MED ORDER — TECHNETIUM TC 99M TETROFOSMIN IV KIT
31.0000 | PACK | Freq: Once | INTRAVENOUS | Status: AC | PRN
  Administered 2020-01-17: 13:00:00 31 via INTRAVENOUS
  Filled 2020-01-17: qty 31

## 2020-01-21 ENCOUNTER — Encounter: Payer: Self-pay | Admitting: Cardiology

## 2020-01-21 NOTE — Progress Notes (Signed)
Cardiology Office Note   Date:  01/22/2020   ID:  TAMAS SUEN, DOB 1961-05-14, MRN [id number]  PCP:  [name], MD  Cardiologist:   No primary care provider on file. Referring:  [name], MD  Chief Complaint  Patient presents with  . Fatigue      History of Present Illness: Bradley Watson is a 59 y.o. male who is referred by [name], MD for evaluation of a reduced EF.    He was seen by Dr. Jens Som greater than 3 years ago.  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  He had LIMA to the LAD, left radial to the obtuse marginal. Echocardiogram November 2013 showed normal LV function, grade 1 diastolic dysfunction and mild left atrial enlargement. Nuclear study in June of 2015 showed an ejection fraction of 52%. Apical thinning but no ischemia. He had a repeat Lexiscan Myoview ordered by [name], MD last week.  This demonstrated a small apical reversible defect with an EF that was slightly lower than previous.     The patient has been having symptoms that he thinks are not consistent with Covid.  He has had sore throat of cough and lost his taste and smell starting in November.  However, he had 2 - Covid tests.  He had a chest CT that did not suggest any abnormalities other than coronary artery calcifications.  Chest x-ray was okay.  He subsequently had resolution of his cough although it took a long time.  He eventually developed migraines and now has profound fatigue.  He saw his primary provider who ordered the stress test for evaluation of the coronary calcification.  He also has significantly uncontrolled blood pressure and lipids.  He was prescribed Crestor carvedilol and Avapro but he stopped taking these because of the fatigue.  He lost 20 pounds.  His blood pressures actually been better since then.  He denies any chest pressure, neck or arm discomfort.  He has had no new shortness of breath, PND or orthopnea.  Has had no weight gain or edema.   Past Medical  History:  Diagnosis Date  . Carotid stenosis    Pre-CABG Dopplers: RICA 40-59%;  Carotid US (05/2014):  R 1-39%; normal LICA  . Cataract    OU  . Childhood asthma   . Cluster headaches 1985   Mid 80's  . Coronary artery disease    a.  LHC 10/27/12 demonstrated pLAD 70%, AV CFX occluded, pOM 80%, dCFX collateralized the LAD and RCA, RCA without obstructive lesions, EF 50% with inferobasal HK;  b.  Echo 10/30/12: EF 55-60%, grade 1 diastolic dysfunction, mild LAE, normal LV wall motion;  c. s/p CABG 10/31/12 with Dr. Dorris Fetch:  L-LAD, left radial-OM;   b.  ETT-Nuc (05/2014):  + ECG changes, no ischemia; low risk  . Diverticulitis 02-2012   CT scan   . Fatty liver 02-2012   Mild- CT scan   . GERD (gastroesophageal reflux disease)   . HLD (hyperlipidemia)   . Iron deficiency anemia 1980's  . Type II diabetes mellitus with manifestations [facility])     Past Surgical History:  Procedure Laterality Date  . CARDIAC CATHETERIZATION  10/27/2012  . cataract  2020  . CORONARY ARTERY BYPASS GRAFT  10/31/2012   Procedure: CORONARY ARTERY BYPASS GRAFTING (CABG);  Surgeon: Loreli Slot, MD;  Location: [facility] OR;  Service: Open Heart Surgery;  Laterality: N/A;  . LEFT HEART CATHETERIZATION WITH CORONARY ANGIOGRAM N/A  10/27/2012   Procedure: LEFT HEART CATHETERIZATION WITH CORONARY ANGIOGRAM;  Surgeon: Hillary Bow, MD;  Location: [facility] CATH LAB;  Service: Cardiovascular;  Laterality: N/A;  . RADIAL ARTERY HARVEST  10/31/2012   Procedure: RADIAL ARTERY HARVEST;  Surgeon: Melrose Nakayama, MD;  Location: [place];  Service: Open Heart Surgery;  Laterality: Left;  . RADIAL KERATOTOMY  1985   bilaterally     Current Outpatient Medications  Medication Sig Dispense Refill  . aspirin 81 MG EC tablet Take 1 tablet (81 mg total) by mouth daily. 90 tablet 1  . brimonidine (ALPHAGAN) 0.2 % ophthalmic solution Place 1 drop into both eyes 3 (three) times daily. (Patient taking differently: Place 1 drop into  both eyes 2 (two) times daily. ) 10 mL 10  . Fluticasone-Umeclidin-Vilant (TRELEGY ELLIPTA) 200-62.5-25 MCG/INH AEPB Inhale 1 puff into the lungs daily. 120 each 1  . hydrOXYzine (ATARAX/VISTARIL) 10 MG tablet Take 30-60 mg by mouth daily as needed for itching.     Marland Kitchen omeprazole (PRILOSEC) 20 MG capsule Take 20 mg by mouth daily as needed (heartburn).     . prednisoLONE acetate (PRED FORTE) 1 % ophthalmic suspension Place 1 drop into the right eye 4 (four) times daily. 10 mL 0  . tacrolimus (PROTOPIC) 0.1 % ointment APP EXT TO THE SKIN BID    . triamcinolone ointment (KENALOG) 0.1 % APPLY ON THE SKIN TWICE A DAY AS NEEDED     No current facility-administered medications for this visit.    Allergies:   Penicillins and Lisinopril    Social History:  The patient  reports that he has quit smoking. He has a 0.18 pack-year smoking history. He has never used smokeless tobacco. He reports current alcohol use. He reports that he does not use drugs.   Family History:  The patient's family history includes Diabetes Mellitus II in his paternal grandmother; Lung cancer in his father; Other in his mother.    ROS:  Please see the history of present illness.   Otherwise, review of systems are positive for none.   All other systems are reviewed and negative.    PHYSICAL EXAM: VS:  BP 114/80   Pulse 75   Temp 97.7 F (36.5 C) (Temporal)   Ht 6' (1.829 m)   Wt 265 lb (120.2 kg)   BMI 35.94 kg/m  , BMI Body mass index is 35.94 kg/m. GENERAL:  Well appearing HEENT:  Pupils equal round and reactive, fundi not visualized, oral mucosa unremarkable NECK:  No jugular venous distention, waveform within normal limits, carotid upstroke brisk and symmetric, no bruits, no thyromegaly LYMPHATICS:  No cervical, inguinal adenopathy LUNGS:  Clear to auscultation bilaterally BACK:  No CVA tenderness CHEST:  Well healed sternotomy scar. HEART:  PMI not displaced or sustained,S1 and S2 within normal limits, no S3,  no S4, no clicks, no rubs, no murmurs ABD:  Flat, positive bowel sounds normal in frequency in pitch, no bruits, no rebound, no guarding, no midline pulsatile mass, no hepatomegaly, no splenomegaly EXT:  2 plus pulses throughout, no edema, no cyanosis no clubbing SKIN:  No rashes no nodules NEURO:  Cranial nerves II through XII grossly intact, motor grossly intact throughout PSYCH:  Cognitively intact, oriented to person place and time    EKG:  EKG is ordered today. The ekg ordered today demonstrates sinus rhythm, rate 75, axis within normal limits, intervals within normal limits, no acute ST-T wave changes.   Recent Labs: 08/13/2019: ALT 19 12/18/2019: BUN  15; Creatinine, Ser 1.16; Hemoglobin 12.5; Platelets 255.0; Potassium 3.8; Sodium 135; TSH 1.94    Lipid Panel    Component Value Date/Time   CHOL 320 (H) 12/18/2019 1644   TRIG 160.0 (H) 12/18/2019 1644   HDL 51.60 12/18/2019 1644   CHOLHDL 6 12/18/2019 1644   VLDL 32.0 12/18/2019 1644   LDLCALC 236 (H) 12/18/2019 1644      Wt Readings from Last 3 Encounters:  01/22/20 265 lb (120.2 kg)  01/16/20 282 lb (127.9 kg)  12/18/19 282 lb (127.9 kg)      Other studies Reviewed: Additional studies/ records that were reviewed today include: Lexiscan Myoview, labs, carotid Doppler. Review of the above records demonstrates:  Please see elsewhere in the note.     ASSESSMENT AND PLAN:  CAD: He had only a small apical perfusion defect on the recent study.  This was not a high risk finding.  However, he needs aggressive risk reduction.  However, no further cardiovascular testing is suggested.  ISCHEMIC CARDIOMYOPATHY:    EF on perfusion imaging was reduced as above.  The most recent echo was several years ago in 2013 and his EF was 55 to 60%.  I will check an echocardiogram.  I might be able to convince him to start taking some of his medications if his ejection fraction is reduced.  HTN:   The blood pressure has been elevated at  all of his recent readings.  Blood pressure is actually well controlled right now.  He does not want take medications unless his ejection fraction is reduced as above.  DM: A1c was 6.0.  No change in therapy  DYSLIPIDEMIA: LDL most recently was 236.  Triglycerides 160.  Total cholesterol 320.  He does not want take more medications but would agree if a fasting cholesterol done today is not at target.  CAROTID STENOSIS: He had very mild plaque in 2017.  No further imaging.      Current medicines are reviewed at length with the patient today.  The patient does not have concerns regarding medicines.  The following changes have been made:  no change  Labs/ tests ordered today include:   Orders Placed This Encounter  Procedures  . Lipid panel  . EKG 12-Lead  . ECHOCARDIOGRAM COMPLETE     Disposition:   FU with me after the above studies.     Signed, Rollene Rotunda, MD  01/22/2020 1:17 PM    [facility]

## 2020-01-22 ENCOUNTER — Ambulatory Visit (INDEPENDENT_AMBULATORY_CARE_PROVIDER_SITE_OTHER): Payer: PRIVATE HEALTH INSURANCE | Admitting: Cardiology

## 2020-01-22 ENCOUNTER — Other Ambulatory Visit: Payer: Self-pay

## 2020-01-22 ENCOUNTER — Encounter: Payer: Self-pay | Admitting: Cardiology

## 2020-01-22 VITALS — BP 114/80 | HR 75 | Temp 97.7°F | Ht 72.0 in | Wt 265.0 lb

## 2020-01-22 DIAGNOSIS — E118 Type 2 diabetes mellitus with unspecified complications: Secondary | ICD-10-CM | POA: Diagnosis not present

## 2020-01-22 DIAGNOSIS — I6529 Occlusion and stenosis of unspecified carotid artery: Secondary | ICD-10-CM

## 2020-01-22 DIAGNOSIS — E785 Hyperlipidemia, unspecified: Secondary | ICD-10-CM | POA: Diagnosis not present

## 2020-01-22 DIAGNOSIS — I10 Essential (primary) hypertension: Secondary | ICD-10-CM

## 2020-01-22 DIAGNOSIS — I2510 Atherosclerotic heart disease of native coronary artery without angina pectoris: Secondary | ICD-10-CM | POA: Diagnosis not present

## 2020-01-22 DIAGNOSIS — I429 Cardiomyopathy, unspecified: Secondary | ICD-10-CM

## 2020-01-22 NOTE — Patient Instructions (Signed)
Medication Instructions:  No changes *If you need a refill on your cardiac medications before your next appointment, please call your pharmacy*  Lab Work: Your physician recommends that you return for lab work today (lipids) If you have labs (blood work) drawn today and your tests are completely normal, you will receive your results only by: Marland Kitchen MyChart Message (if you have MyChart) OR . A paper copy in the mail If you have any lab test that is abnormal or we need to change your treatment, we will call you to review the results.  Testing/Procedures: Your physician has requested that you have an echocardiogram. Echocardiography is a painless test that uses sound waves to create images of your heart. It provides your doctor with information about the size and shape of your heart and how well your heart's chambers and valves are working. This procedure takes approximately one hour. There are no restrictions for this procedure. [address]  Follow-Up: At [facility], you and your health needs are our priority.  As part of our continuing mission to provide you with exceptional heart care, we have created designated Provider Care Teams.  These Care Teams include your primary Cardiologist (physician) and Advanced Practice Providers (APPs -  Physician Assistants and Nurse Practitioners) who all work together to provide you with the care you need, when you need it.  Your next appointment:   Follow up after echocardiogram

## 2020-01-23 LAB — LIPID PANEL
Chol/HDL Ratio: 6.7 ratio — ABNORMAL HIGH (ref 0.0–5.0)
Cholesterol, Total: 296 mg/dL — ABNORMAL HIGH (ref 100–199)
HDL: 44 mg/dL (ref >39)
LDL Chol Calc (NIH): 227 mg/dL — ABNORMAL HIGH (ref 0–99)
Triglycerides: 134 mg/dL (ref 0–149)
VLDL Cholesterol Cal: 25 mg/dL (ref 5–40)

## 2020-01-26 ENCOUNTER — Ambulatory Visit: Payer: PRIVATE HEALTH INSURANCE | Admitting: Physician Assistant

## 2020-01-26 ENCOUNTER — Telehealth: Payer: Self-pay | Admitting: Cardiology

## 2020-01-26 NOTE — Telephone Encounter (Signed)
I don't think he is going to bring his lipids down to target without significant medical intervention and I would still suggest an appointment with Dr. Rennis Golden

## 2020-01-26 NOTE — Telephone Encounter (Signed)
Called patient to offer sooner lipid clinic appointment with Dr. Rennis Golden [facility] Feb 8). He reports when he had the labs this week, he had not been taking his cholesterol medication consistently. He is taking crestor 20 and also reports taking carvedilol 3.125mg  BID - neither on list but have been added.   Routed to MD/RN as Lorain Childes

## 2020-02-02 ENCOUNTER — Other Ambulatory Visit: Payer: Self-pay

## 2020-02-02 ENCOUNTER — Ambulatory Visit (HOSPITAL_COMMUNITY): Payer: PRIVATE HEALTH INSURANCE | Attending: Internal Medicine

## 2020-02-02 DIAGNOSIS — I429 Cardiomyopathy, unspecified: Secondary | ICD-10-CM | POA: Insufficient documentation

## 2020-02-02 MED ORDER — PERFLUTREN LIPID MICROSPHERE
1.0000 mL | INTRAVENOUS | Status: AC | PRN
  Administered 2020-02-02: 1 mL via INTRAVENOUS

## 2020-02-05 ENCOUNTER — Telehealth: Payer: Self-pay

## 2020-02-05 NOTE — Telephone Encounter (Signed)
Spoke with patient. Patient reports since beginning of January he has been having dizziness and fatigue. He reports dizziness is sitting, standing, walking. It comes and goes throughout the day no time period is worse than the other.   Pt takes vitamins, Trelegy, Carvedilol, Rosuvastatin and aspirin in the morning.   Patient wants to know what's causing his dizziness and fatigue.   Will send to MD for review.

## 2020-02-05 NOTE — Telephone Encounter (Signed)
-----   Message from Rollene Rotunda, MD sent at 02/05/2020 12:03 PM EST ----- Regarding: FW: Patient condition Need to understand which meds does he think is making him fatigued?   ----- Message ----- From: Yolande Jolly, RDCS Sent: 02/02/2020  11:04 AM EST To: Rollene Rotunda, MD Subject: Patient condition                              Dr. Antoine Poche,  Good morning! Just finished doing Bradley Watson's echo this morning and he would like me to relay a message to you. Bradley Watson told me that whenever he takes his medicines in the morning, he's very dizzy and fatigued. He was this way this morning as well. I am wondering if you are able to talk to him and get the whole story. Thanks!  Genevie Cheshire

## 2020-02-08 NOTE — Telephone Encounter (Signed)
Spoke with patient. Per Dr. Antoine Poche patient needs an appt to review results and discuss meds. He has many med intolerances. Patient scheduled for Monday 02/12/20 with Joni Reining.

## 2020-02-08 NOTE — Telephone Encounter (Signed)
He needs an appt to review results and discuss meds.  He has many med intolerances.  Could be with APP.

## 2020-02-11 NOTE — Progress Notes (Signed)
Cardiology Office Note   Date:  02/12/2020   ID:  Bradley Watson, Bradley Watson 1961-05-27, MRN [id number]  PCP:  Janith Lima, MD  Cardiologist: Dr.  Percival Spanish  No chief complaint on file.    History of Present Illness: Bradley Watson is a 59 y.o. male who presents for ongoing assessment and management of coronary artery disease.  He has a history of coronary artery bypass grafting 10/2012 (LIMA to LAD, left radial to obtuse marginal: (.,  Echocardiogram at that time revealed normal LV systolic function.  His primary care physician, Dr. Ronnald Ramp, ordered a repeat [facility] which demonstrated a small apical reversible defect with an EF that was slightly lower than previous documentation.  Mr. Kaufhold  thought that he may have had COVID, as he was having symptoms consistent with sore throat cough loss of taste and smell along with some difficulty breathing.  He was negative on his Covid test.  CT scan was negative for abnormalities other than coronary artery calcification.  He was profoundly fatigued however leading to the Lexiscan stress test.  Lexiscan stress test was not found to be a high risk study however he does need aggressive risk reduction to include better blood pressure control, hyperlipidemia control.  Echocardiogram was reordered due to reduced EF on Lexiscan.  The patient does not like taking medications and therefore test results of echo would help to manage his medications more consistently.  It is noted that he has medication intolerances.  Echocardiogram dated 02/02/2020 revealed reduced EF of 45% to 50%, the left ventricle did have some mildly decreased function.  He was found to have grade 1 diastolic dysfunction.  RV systolic function was also moderately reduced.  As a result of the abnormal echocardiogram we will need to reconfigure his medication regimen and give thoughtful education concerning the basis for this and hopefully engage the patient more concerning medication  compliance.  He comes today with multiple somatic complaints from headaches to Covid symptoms to fatigue to palpitations, dizziness.  He has not been taking carvedilol as directed nor has he been taking rosuvastatin as directed.  He states he did not know what they were for and had to lift him up on the Internet and became confused about them.  He has not yet returned to work due to his multiple symptoms.  Past Medical History:  Diagnosis Date  . Carotid stenosis    Pre-CABG Dopplers: RICA 40-59%;  Carotid US (05/2014):  R 1-93%; normal LICA  . Cataract    OU  . Childhood asthma   . Cluster headaches 1985   Mid 80's  . Coronary artery disease    a.  LHC 10/27/12 demonstrated pLAD 70%, AV CFX occluded, pOM 80%, dCFX collateralized the LAD and RCA, RCA without obstructive lesions, EF 50% with inferobasal HK;  b.  Echo 10/30/12: EF 79-02%, grade 1 diastolic dysfunction, mild LAE, normal LV wall motion;  c. s/p CABG 10/31/12 with Dr. Roxan Hockey:  L-LAD, left radial-OM;   b.  ETT-Nuc (05/2014):  + ECG changes, no ischemia; low risk  . Diverticulitis 02-2012   CT scan   . Fatty liver 02-2012   Mild- CT scan   . GERD (gastroesophageal reflux disease)   . HLD (hyperlipidemia)   . Iron deficiency anemia 1980's  . Type II diabetes mellitus with manifestations [facility])     Past Surgical History:  Procedure Laterality Date  . CARDIAC CATHETERIZATION  10/27/2012  . cataract  2020  . CORONARY ARTERY BYPASS  GRAFT  10/31/2012   Procedure: CORONARY ARTERY BYPASS GRAFTING (CABG);  Surgeon: Melrose Nakayama, MD;  Location: [place];  Service: Open Heart Surgery;  Laterality: N/A;  . LEFT HEART CATHETERIZATION WITH CORONARY ANGIOGRAM N/A 10/27/2012   Procedure: LEFT HEART CATHETERIZATION WITH CORONARY ANGIOGRAM;  Surgeon: Hillary Bow, MD;  Location: [facility] CATH LAB;  Service: Cardiovascular;  Laterality: N/A;  . RADIAL ARTERY HARVEST  10/31/2012   Procedure: RADIAL ARTERY HARVEST;  Surgeon: Melrose Nakayama, MD;  Location: [place];  Service: Open Heart Surgery;  Laterality: Left;  . RADIAL KERATOTOMY  1985   bilaterally     Current Outpatient Medications  Medication Sig Dispense Refill  . aspirin 81 MG EC tablet Take 1 tablet (81 mg total) by mouth daily. 90 tablet 1  . brimonidine (ALPHAGAN) 0.2 % ophthalmic solution Place 1 drop into both eyes 3 (three) times daily. (Patient taking differently: Place 1 drop into both eyes 2 (two) times daily. ) 10 mL 10  . carvedilol (COREG) 3.125 MG tablet Take 1 tablet (3.125 mg total) by mouth 2 (two) times daily with a meal. 180 tablet 3  . Fluticasone-Umeclidin-Vilant (TRELEGY ELLIPTA) 200-62.5-25 MCG/INH AEPB Inhale 1 puff into the lungs daily. 120 each 1  . hydrOXYzine (ATARAX/VISTARIL) 10 MG tablet Take 30-60 mg by mouth daily as needed for itching.     Marland Kitchen omeprazole (PRILOSEC) 20 MG capsule Take 20 mg by mouth daily as needed (heartburn).     . prednisoLONE acetate (PRED FORTE) 1 % ophthalmic suspension Place 1 drop into the right eye 4 (four) times daily. 10 mL 0  . rosuvastatin (CRESTOR) 20 MG tablet Take 1 tablet (20 mg total) by mouth daily. 90 tablet 3  . tacrolimus (PROTOPIC) 0.1 % ointment APP EXT TO THE SKIN BID    . triamcinolone ointment (KENALOG) 0.1 % APPLY ON THE SKIN TWICE A DAY AS NEEDED     No current facility-administered medications for this visit.    Allergies:   Penicillins and Lisinopril    Social History:  The patient  reports that he has quit smoking. He has a 0.18 pack-year smoking history. He has never used smokeless tobacco. He reports current alcohol use. He reports that he does not use drugs.   Family History:  The patient's family history includes Diabetes Mellitus II in his paternal grandmother; Lung cancer in his father; Other in his mother.    ROS: All other systems are reviewed and negative. Unless otherwise mentioned in H&P    PHYSICAL EXAM: VS:  BP 114/80   Pulse 87   Ht 6' (1.829 m)   Wt 259 lb  3.2 oz (117.6 kg)   SpO2 94%   BMI 35.15 kg/m  , BMI Body mass index is 35.15 kg/m. GEN: Well nourished, well developed, in no acute distress HEENT: normal Neck: no JVD, carotid bruits, or masses Cardiac: RRR, tachycardic; no murmurs, rubs, or gallops,no edema  Respiratory:  Clear to auscultation bilaterally, normal work of breathing GI: soft, nontender, nondistended, + BS MS: no deformity or atrophy Skin: warm and dry, no rash Neuro:  Strength and sensation are intact Psych: euthymic mood, full affect   EKG: Not completed this office visit.  Recent Labs: 08/13/2019: ALT 19 12/18/2019: BUN 15; Creatinine, Ser 1.16; Hemoglobin 12.5; Platelets 255.0; Potassium 3.8; Sodium 135; TSH 1.94    Lipid Panel    Component Value Date/Time   CHOL 296 (H) 01/22/2020 1200   TRIG 134 01/22/2020 1200  HDL 44 01/22/2020 1200   CHOLHDL 6.7 (H) 01/22/2020 1200   CHOLHDL 6 12/18/2019 1644   VLDL 32.0 12/18/2019 1644   LDLCALC 227 (H) 01/22/2020 1200      Wt Readings from Last 3 Encounters:  02/12/20 259 lb 3.2 oz (117.6 kg)  01/22/20 265 lb (120.2 kg)  01/16/20 282 lb (127.9 kg)      Other studies Reviewed: Echocardiogram Feb 07, 2020 1. Left ventricular ejection fraction, by estimation, is 45 to 50%. The  left ventricle has mildly decreased function. The left ventricle  demonstrates global hypokinesis. There is mildly increased left  ventricular hypertrophy. Left ventricular diastolic  parameters are consistent with Grade I diastolic dysfunction (impaired  relaxation).  2. Right ventricular systolic function is moderately reduced. The right  ventricular size is normal. Tricuspid regurgitation signal is inadequate  for assessing PA pressure.  3. The mitral valve is normal in structure and function. Trivial mitral  valve regurgitation. No evidence of mitral stenosis.  4. The aortic valve is tricuspid. Aortic valve regurgitation is not  visualized. No aortic stenosis is present.    5. The inferior vena cava is dilated in size with <50% respiratory  variability, suggesting right atrial pressure of 15 mmHg.   Stress Test 01/17/2020  The left ventricular ejection fraction is moderately decreased (30-44%).  Nuclear stress EF: 40%.  No T wave inversion was noted during stress.  There was no ST segment deviation noted during stress.  Defect 1: There is a small defect of mild severity.  This is an intermediate risk study.   Small size, mild intensity partially reversible apical perfusion defect, may be artifact (SDS 2) - no significant ischemia. LVEF 40% with global hypokinesis. This is an intermediate risk study. Clinical correlation is advised. Compared to a prior study in 2015, the LVEF is lower.  ASSESSMENT AND PLAN:  1.  Coronary artery disease: History of CABG in 2013 (LIMA to LAD, left radial to obtuse marginal).  Most recent [place] revealed a small apical reversible defect but found to be low risk.  The patient will need aggressive medical management.  He has not been taking carvedilol or rosuvastatin as directed.  I have spent a good bit of time discussing the need for him to take his carvedilol as directed.  I did let him know that he may feel worse before he feels better if he takes this regularly until his body begins to get used to the medication.  Not to stop taking it because he feels badly when he restarts his.  I will see him again in 6 weeks to evaluate his response to medication, blood pressure, and repeat an EKG at that time.  2.  Hyperlipidemia: Most recent labs dated 01/22/2020 revealed a total cholesterol 296, LDL of 227.  I have showed him these lab work results and explained to him high risk for repeat myocardial infarction or worsening coronary artery disease.  He is willing to restart rosuvastatin 20 mg daily.  I will repeat his fasting lipids and LFTs in 6 weeks.  I have reinforced the need for consistent medication administration so  that we can evaluate his response to the medications and prevent further or worsening CAD.  He verbalizes understanding.  3.  Diabetes: He states that he has lost approximately 30 pounds on a diabetic diet.  He has no idea what his blood glucose control is.  On follow-up labs I will check a hemoglobin A1c so that his primary care physician can follow  trends.  4.  Hypertension: Blood pressure is currently controlled.  We will continue to follow this with reinstitution of carvedilol consistently.    Current medicines are reviewed at length with the patient today.  I have spent 40 minutes dedicated to the care of this patient on the date of this encounter to include pre-visit review of records, assessment, management and diagnostic testing,with shared decision making.  Labs/ tests ordered today include: C-MET, lipids, hemoglobin A1c.  Phill Myron. West Pugh, ANP, AACC   02/12/2020 3:36 PM    [address] [place] [address] Office [phone] Fax [phone]  Notice: This dictation was prepared with Dragon dictation along with smaller phrase technology. Any transcriptional errors that result from this process are unintentional and may not be corrected upon review.

## 2020-02-12 ENCOUNTER — Ambulatory Visit (INDEPENDENT_AMBULATORY_CARE_PROVIDER_SITE_OTHER): Payer: PRIVATE HEALTH INSURANCE | Admitting: Adult Health

## 2020-02-12 ENCOUNTER — Other Ambulatory Visit: Payer: Self-pay

## 2020-02-12 ENCOUNTER — Encounter: Payer: Self-pay | Admitting: Adult Health

## 2020-02-12 VITALS — BP 114/80 | HR 87 | Ht 72.0 in | Wt 259.2 lb

## 2020-02-12 DIAGNOSIS — I10 Essential (primary) hypertension: Secondary | ICD-10-CM | POA: Diagnosis not present

## 2020-02-12 DIAGNOSIS — E785 Hyperlipidemia, unspecified: Secondary | ICD-10-CM | POA: Diagnosis not present

## 2020-02-12 DIAGNOSIS — I2510 Atherosclerotic heart disease of native coronary artery without angina pectoris: Secondary | ICD-10-CM

## 2020-02-12 DIAGNOSIS — Z9119 Patient's noncompliance with other medical treatment and regimen: Secondary | ICD-10-CM

## 2020-02-12 DIAGNOSIS — I43 Cardiomyopathy in diseases classified elsewhere: Secondary | ICD-10-CM

## 2020-02-12 DIAGNOSIS — Z91199 Patient's noncompliance with other medical treatment and regimen due to unspecified reason: Secondary | ICD-10-CM

## 2020-02-12 DIAGNOSIS — Z79899 Other long term (current) drug therapy: Secondary | ICD-10-CM

## 2020-02-12 DIAGNOSIS — Z951 Presence of aortocoronary bypass graft: Secondary | ICD-10-CM

## 2020-02-12 DIAGNOSIS — E118 Type 2 diabetes mellitus with unspecified complications: Secondary | ICD-10-CM | POA: Diagnosis not present

## 2020-02-12 MED ORDER — ROSUVASTATIN CALCIUM 20 MG PO TABS: 20.0000 mg | ORAL_TABLET | Freq: Every day | ORAL | 3 refills | Status: DC

## 2020-02-12 MED ORDER — CARVEDILOL 3.125 MG PO TABS: 3.1250 mg | ORAL_TABLET | Freq: Two times a day (BID) | ORAL | 3 refills | Status: DC

## 2020-02-12 NOTE — Patient Instructions (Signed)
Medication Instructions:  Continue current medications  *If you need a refill on your cardiac medications before your next appointment, please call your pharmacy*  Lab Work: Fasting Lipid and Liver, CBC and HgB A1C  If you have labs (blood work) drawn today and your tests are completely normal, you will receive your results only by: Marland Kitchen MyChart Message (if you have MyChart) OR . A paper copy in the mail If you have any lab test that is abnormal or we need to change your treatment, we will call you to review the results.  Testing/Procedures: None Ordered  Follow-Up: At [facility], you and your health needs are our priority.  As part of our continuing mission to provide you with exceptional heart care, we have created designated Provider Care Teams.  These Care Teams include your primary Cardiologist (physician) and Advanced Practice Providers (APPs -  Physician Assistants and Nurse Practitioners) who all work together to provide you with the care you need, when you need it.  Your next appointment:   6 week(s)  The format for your next appointment:   In Person  Provider:   Dr Rollene Rotunda or Joni Reining, DNP

## 2020-02-22 ENCOUNTER — Encounter: Payer: Self-pay | Admitting: Internal Medicine

## 2020-02-22 ENCOUNTER — Other Ambulatory Visit: Payer: Self-pay

## 2020-02-22 ENCOUNTER — Telehealth: Payer: Self-pay | Admitting: Internal Medicine

## 2020-02-22 ENCOUNTER — Ambulatory Visit (INDEPENDENT_AMBULATORY_CARE_PROVIDER_SITE_OTHER): Payer: PRIVATE HEALTH INSURANCE | Admitting: Internal Medicine

## 2020-02-22 VITALS — BP 132/88 | HR 82 | Temp 97.0°F | Ht 72.0 in | Wt 253.2 lb

## 2020-02-22 DIAGNOSIS — I2510 Atherosclerotic heart disease of native coronary artery without angina pectoris: Secondary | ICD-10-CM | POA: Diagnosis not present

## 2020-02-22 DIAGNOSIS — E7849 Other hyperlipidemia: Secondary | ICD-10-CM | POA: Diagnosis not present

## 2020-02-22 DIAGNOSIS — E118 Type 2 diabetes mellitus with unspecified complications: Secondary | ICD-10-CM | POA: Diagnosis not present

## 2020-02-22 DIAGNOSIS — Z91199 Patient's noncompliance with other medical treatment and regimen due to unspecified reason: Secondary | ICD-10-CM

## 2020-02-22 DIAGNOSIS — Z951 Presence of aortocoronary bypass graft: Secondary | ICD-10-CM

## 2020-02-22 DIAGNOSIS — Z9119 Patient's noncompliance with other medical treatment and regimen: Secondary | ICD-10-CM

## 2020-02-22 NOTE — Patient Instructions (Signed)
Medication Instructions:  Continue current medications  *If you need a refill on your cardiac medications before your next appointment, please call your pharmacy*   Lab Work: FASTING lab work in 3 months to check cholesterol  If you have labs (blood work) drawn today and your tests are completely normal, you will receive your results only by: Marland Kitchen MyChart Message (if you have MyChart) OR . A paper copy in the mail If you have any lab test that is abnormal or we need to change your treatment, we will call you to review the results.   Testing/Procedures: NONE   Follow-Up: At [facility], you and your health needs are our priority.  As part of our continuing mission to provide you with exceptional heart care, we have created designated Provider Care Teams.  These Care Teams include your primary Cardiologist (physician) and Advanced Practice Providers (APPs -  Physician Assistants and Nurse Practitioners) who all work together to provide you with the care you need, when you need it.  We recommend signing up for the patient portal called "MyChart".  Sign up information is provided on this After Visit Summary.  MyChart is used to connect with patients for Virtual Visits (Telemedicine).  Patients are able to view lab/test results, encounter notes, upcoming appointments, etc.  Non-urgent messages can be sent to your provider as well.   To learn more about what you can do with MyChart, go to [email or web address].    Your next appointment:   3 month(s) - lipid clinic  The format for your next appointment:   In Person  Provider:   K. Italy Hilty, MD   Other Instructions

## 2020-02-22 NOTE — Telephone Encounter (Signed)
New message:    Pt is calling and wanting to know of any diabetic doctors in our practice that he can go and see. Please advise.

## 2020-02-23 ENCOUNTER — Encounter: Payer: Self-pay | Admitting: Internal Medicine

## 2020-02-23 NOTE — Telephone Encounter (Signed)
Pt contacted. Pt stated that he is feeling tired all of the time and is concerned that it is DM related. I offered pt the first available appt and pt is sched for 02/27/2020 at 08:20am

## 2020-02-23 NOTE — Progress Notes (Signed)
LIPID CLINIC CONSULT NOTE  Chief Complaint:  Manage dyslipidemia  Primary Care Physician: Janith Lima, MD  Primary Cardiologist:  No primary care provider on file.  HPI:  Bradley Watson is a 59 y.o. male who is being seen today for the evaluation of dyslipidemia at the request of Marijo File, MD. This is a pleasant 59 year old male with a longstanding history of dyslipidemia and unfortunately early onset heart disease.  He underwent CABG previously and has struggled with dyslipidemia.  He is also struggled with significant obesity and dietary challenges.  Most recently he had a lipid profile which showed a total cholesterol of 296, triglycerides 134, HDL 44 and LDL 227.  He had previously been on rosuvastatin however was recently not taking it.  As of the fact past several weeks he restarted taking the medication.  He is also on aspirin and some carvedilol.  The lipids are certainly concerning for probable familial hyperlipidemia, especially given his early onset heart disease however he is adopted therefore we do not really know his biologic family history.  In addition he does not have any children or plan to.  Genetic testing in this situation would not be of much benefit.  Target LDL of course is much lower at least a 50% reduction or if achievable try to target LDL less than 70.  PMHx:  Past Medical History:  Diagnosis Date  . Carotid stenosis    Pre-CABG Dopplers: RICA 40-59%;  Carotid US (05/2014):  R 6-60%; normal LICA  . Cataract    OU  . Childhood asthma   . Cluster headaches 1985   Mid 80's  . Coronary artery disease    a.  LHC 10/27/12 demonstrated pLAD 70%, AV CFX occluded, pOM 80%, dCFX collateralized the LAD and RCA, RCA without obstructive lesions, EF 50% with inferobasal HK;  b.  Echo 10/30/12: EF 63-01%, grade 1 diastolic dysfunction, mild LAE, normal LV wall motion;  c. s/p CABG 10/31/12 with Dr. Roxan Hockey:  L-LAD, left radial-OM;   b.  ETT-Nuc (05/2014):  +  ECG changes, no ischemia; low risk  . Diverticulitis 02-2012   CT scan   . Fatty liver 02-2012   Mild- CT scan   . GERD (gastroesophageal reflux disease)   . HLD (hyperlipidemia)   . Iron deficiency anemia 1980's  . Type II diabetes mellitus with manifestations [facility])     Past Surgical History:  Procedure Laterality Date  . CARDIAC CATHETERIZATION  10/27/2012  . cataract  2020  . CORONARY ARTERY BYPASS GRAFT  10/31/2012   Procedure: CORONARY ARTERY BYPASS GRAFTING (CABG);  Surgeon: Melrose Nakayama, MD;  Location: [place];  Service: Open Heart Surgery;  Laterality: N/A;  . LEFT HEART CATHETERIZATION WITH CORONARY ANGIOGRAM N/A 10/27/2012   Procedure: LEFT HEART CATHETERIZATION WITH CORONARY ANGIOGRAM;  Surgeon: Hillary Bow, MD;  Location: [facility] C[facility] LAB;  Service: Cardiovascular;  Laterality: N/A;  . RADIAL ARTERY HARVEST  10/31/2012   Procedure: RADIAL ARTERY HARVEST;  Surgeon: Melrose Nakayama, MD;  Location: [place];  Service: Open Heart Surgery;  Laterality: Left;  . RADIAL KERATOTOMY  1985   bilaterally    FAMHx:  Family History  Problem Relation Age of Onset  . Other Mother        car accident   . Lung cancer Father   . Diabetes Mellitus II Paternal Grandmother     SOCHx:   reports that he has quit smoking. He has a 0.18 pack-year smoking history.  He has never used smokeless tobacco. He reports current alcohol use. He reports that he does not use drugs.  ALLERGIES:  Allergies  Allergen Reactions  . Penicillins Anxiety and Other (See Comments)    "Made me feel like gravity had increased and it was pulling me down" (10/27/2012) Did it involve swelling of the face/tongue/throat, SOB, or low BP? No Did it involve sudden or severe rash/hives, skin peeling, or any reaction on the inside of your mouth or nose? No Did you need to seek medical attention at a hospital or doctor's office? No When did it last happen?Over 10 years ago If all above answers are "NO", may  proceed with cephalosporin use.  Marland Kitchen Lisinopril Cough    ROS: Pertinent items noted in HPI and remainder of comprehensive ROS otherwise negative.  HOME MEDS: Current Outpatient Medications on File Prior to Visit  Medication Sig Dispense Refill  . aspirin 81 MG EC tablet Take 1 tablet (81 mg total) by mouth daily. 90 tablet 1  . brimonidine (ALPHAGAN) 0.2 % ophthalmic solution Place 1 drop into both eyes 3 (three) times daily. (Patient taking differently: Place 1 drop into both eyes 2 (two) times daily. ) 10 mL 10  . carvedilol (COREG) 3.125 MG tablet Take 1 tablet (3.125 mg total) by mouth 2 (two) times daily with a meal. 180 tablet 3  . Fluticasone-Umeclidin-Vilant (TRELEGY ELLIPTA) 200-62.5-25 MCG/INH AEPB Inhale 1 puff into the lungs daily. 120 each 1  . hydrOXYzine (ATARAX/VISTARIL) 10 MG tablet Take 30-60 mg by mouth daily as needed for itching.     Marland Kitchen omeprazole (PRILOSEC) 20 MG capsule Take 20 mg by mouth daily as needed (heartburn).     . prednisoLONE acetate (PRED FORTE) 1 % ophthalmic suspension Place 1 drop into the right eye 4 (four) times daily. 10 mL 0  . rosuvastatin (CRESTOR) 20 MG tablet Take 1 tablet (20 mg total) by mouth daily. 90 tablet 3  . tacrolimus (PROTOPIC) 0.1 % ointment APP EXT TO THE SKIN BID    . triamcinolone ointment (KENALOG) 0.1 % APPLY ON THE SKIN TWICE A DAY AS NEEDED     No current facility-administered medications on file prior to visit.    LABS/IMAGING: No results found for this or any previous visit (from the past 48 hour(s)). No results found.  LIPID PANEL:    Component Value Date/Time   CHOL 296 (H) 01/22/2020 1200   TRIG 134 01/22/2020 1200   HDL 44 01/22/2020 1200   CHOLHDL 6.7 (H) 01/22/2020 1200   CHOLHDL 6 12/18/2019 1644   VLDL 32.0 12/18/2019 1644   LDLCALC 227 (H) 01/22/2020 1200    WEIGHTS: Wt Readings from Last 3 Encounters:  02/22/20 253 lb 3.2 oz (114.9 kg)  02/12/20 259 lb 3.2 oz (117.6 kg)  01/22/20 265 lb (120.2 kg)     VITALS: BP 132/88   Pulse 82   Temp (!) 97 F (36.1 C)   Ht 6' (1.829 m)   Wt 253 lb 3.2 oz (114.9 kg)   SpO2 95%   BMI 34.34 kg/m   EXAM: General appearance: alert, no distress and morbidly obese Neck: no carotid bruit, no JVD and thyroid not enlarged, symmetric, no tenderness/mass/nodules Lungs: clear to auscultation bilaterally Heart: regular rate and rhythm, S1, S2 normal, no murmur, click, rub or gallop Abdomen: soft, non-tender; bowel sounds normal; no masses,  no organomegaly Extremities: extremities normal, atraumatic, no cyanosis or edema Pulses: 2+ and symmetric Skin: Skin color, texture, turgor normal. No  rashes or lesions Neurologic: Grossly normal Psych: Pleasant  EKG: Deferred  ASSESSMENT: 1. 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 Coronary artery disease status post CABG (premature onset) 3. Morbid obesity with weight loss efforts in place  PLAN: 1.   Mr. Mechling has a significant dyslipidemia however is made significant new dietary changes and lost a decent amount of weight.  He is also recently restarted Crestor only for about 2 to 3 weeks.  His lipid profile is concerning for familial hyperlipidemia however since he is adopted it will be difficult to establish this.  I would like to see primarily how he does with diet, weight loss and resumption of the statin.  If he remains above LDL less than 70, would consider adding a PCSK9 inhibitor.  Plan follow-up with me in about 3 months with a lipid NMR and LP(a). I did provide additional information about FH and the FH foundation.  Thanks again for the kind referral.  Chrystie Nose, MD, [facility]  [place]  [facility]  Medical Director of the Advanced Lipid Disorders &  Cardiovascular Risk Reduction Clinic Diplomate of the American Board of Clinical Lipidology Attending Cardiologist  Direct Dial: [phone]  Fax: [phone]  Website:  [email or web address]  Imonie Tuch 02/23/2020, 11:10 AM

## 2020-02-27 ENCOUNTER — Ambulatory Visit: Payer: PRIVATE HEALTH INSURANCE | Admitting: Internal Medicine

## 2020-02-27 ENCOUNTER — Encounter: Payer: Self-pay | Admitting: Internal Medicine

## 2020-02-27 ENCOUNTER — Other Ambulatory Visit: Payer: Self-pay

## 2020-02-27 VITALS — BP 112/76 | HR 78 | Temp 98.4°F | Resp 16 | Ht 72.0 in | Wt 253.2 lb

## 2020-02-27 DIAGNOSIS — I10 Essential (primary) hypertension: Secondary | ICD-10-CM | POA: Diagnosis not present

## 2020-02-27 DIAGNOSIS — E519 Thiamine deficiency, unspecified: Secondary | ICD-10-CM | POA: Diagnosis not present

## 2020-02-27 DIAGNOSIS — D520 Dietary folate deficiency anemia: Secondary | ICD-10-CM | POA: Diagnosis not present

## 2020-02-27 DIAGNOSIS — E785 Hyperlipidemia, unspecified: Secondary | ICD-10-CM | POA: Diagnosis not present

## 2020-02-27 DIAGNOSIS — G4719 Other hypersomnia: Secondary | ICD-10-CM | POA: Insufficient documentation

## 2020-02-27 DIAGNOSIS — Z1211 Encounter for screening for malignant neoplasm of colon: Secondary | ICD-10-CM | POA: Insufficient documentation

## 2020-02-27 DIAGNOSIS — D539 Nutritional anemia, unspecified: Secondary | ICD-10-CM

## 2020-02-27 LAB — LIPID PANEL
Cholesterol: 158 mg/dL (ref 0–200)
HDL: 33.5 mg/dL — ABNORMAL LOW (ref >39.00)
LDL Cholesterol: 107 mg/dL — ABNORMAL HIGH (ref 0–99)
NonHDL: 124.78
Total CHOL/HDL Ratio: 5
Triglycerides: 89 mg/dL (ref 0.0–149.0)
VLDL: 17.8 mg/dL (ref 0.0–40.0)

## 2020-02-27 LAB — CBC WITH DIFFERENTIAL/PLATELET
Basophils Absolute: 0 10*3/uL (ref 0.0–0.1)
Basophils Relative: 0.9 % (ref 0.0–3.0)
Eosinophils Absolute: 0.2 10*3/uL (ref 0.0–0.7)
Eosinophils Relative: 3.2 % (ref 0.0–5.0)
HCT: 36.8 % — ABNORMAL LOW (ref 39.0–52.0)
Hemoglobin: 12.2 g/dL — ABNORMAL LOW (ref 13.0–17.0)
Lymphocytes Relative: 37.8 % (ref 12.0–46.0)
Lymphs Abs: 1.9 10*3/uL (ref 0.7–4.0)
MCHC: 33.1 g/dL (ref 30.0–36.0)
MCV: 91.7 fl (ref 78.0–100.0)
Monocytes Absolute: 0.5 10*3/uL (ref 0.1–1.0)
Monocytes Relative: 9.9 % (ref 3.0–12.0)
Neutro Abs: 2.4 10*3/uL (ref 1.4–7.7)
Neutrophils Relative %: 48.2 % (ref 43.0–77.0)
Platelets: 207 10*3/uL (ref 150.0–400.0)
RBC: 4.01 Mil/uL — ABNORMAL LOW (ref 4.22–5.81)
RDW: 14.3 % (ref 11.5–15.5)
WBC: 5 10*3/uL (ref 4.0–10.5)

## 2020-02-27 LAB — FOLATE: Folate: 11.9 ng/mL (ref >5.9)

## 2020-02-27 MED ORDER — ARMODAFINIL 150 MG PO TABS: 150.0000 mg | ORAL_TABLET | Freq: Every day | ORAL | 1 refills | Status: DC

## 2020-02-27 NOTE — Progress Notes (Signed)
Subjective:  Patient ID: Bradley Watson, male    DOB: November 13, 1961  Age: 59 y.o. MRN: [id number]  CC: Anemia and Hypertension  This visit occurred during the SARS-CoV-2 public health emergency.  Safety protocols were in place, including screening questions prior to the visit, additional usage of staff PPE, and extensive cleaning of exam room while observing appropriate contact time as indicated for disinfecting solutions.    HPI Bradley Watson presents for f/up - He complains that over the last few months his fatigue has worsened and has had a few episodes of dizziness.  His cardiologist has told him to get a sleep study done.  He has excessive daytime sleepiness.  He sleeps up to 12 to 14 hours a day.  Outpatient Medications Prior to Visit  Medication Sig Dispense Refill  . aspirin 81 MG EC tablet Take 1 tablet (81 mg total) by mouth daily. 90 tablet 1  . brimonidine (ALPHAGAN) 0.2 % ophthalmic solution Place 1 drop into both eyes 3 (three) times daily. (Patient taking differently: Place 1 drop into both eyes 2 (two) times daily. ) 10 mL 10  . carvedilol (COREG) 3.125 MG tablet Take 1 tablet (3.125 mg total) by mouth 2 (two) times daily with a meal. 180 tablet 3  . Fluticasone-Umeclidin-Vilant (TRELEGY ELLIPTA) 200-62.5-25 MCG/INH AEPB Inhale 1 puff into the lungs daily. 120 each 1  . hydrOXYzine (ATARAX/VISTARIL) 10 MG tablet Take 30-60 mg by mouth daily as needed for itching.     Marland Kitchen omeprazole (PRILOSEC) 20 MG capsule Take 20 mg by mouth daily as needed (heartburn).     . rosuvastatin (CRESTOR) 20 MG tablet Take 1 tablet (20 mg total) by mouth daily. 90 tablet 3  . tacrolimus (PROTOPIC) 0.1 % ointment APP EXT TO THE SKIN BID    . triamcinolone ointment (KENALOG) 0.1 % APPLY ON THE SKIN TWICE A DAY AS NEEDED    . prednisoLONE acetate (PRED FORTE) 1 % ophthalmic suspension Place 1 drop into the right eye 4 (four) times daily. 10 mL 0   No facility-administered medications prior to visit.      ROS Review of Systems  Constitutional: Positive for fatigue. Negative for appetite change, chills, diaphoresis and unexpected weight change.  HENT: Negative.   Eyes: Negative for visual disturbance.  Respiratory: Negative for apnea, cough, chest tightness, shortness of breath and wheezing.   Cardiovascular: Negative for chest pain, palpitations and leg swelling.  Gastrointestinal: Negative for abdominal pain, blood in stool, constipation, diarrhea, nausea and vomiting.  Endocrine: Negative.   Genitourinary: Negative.   Musculoskeletal: Negative for arthralgias, joint swelling and myalgias.  Skin: Negative for color change, pallor and rash.  Neurological: Positive for dizziness. Negative for weakness, light-headedness and numbness.  Hematological: Negative for adenopathy. Does not bruise/bleed easily.  Psychiatric/Behavioral: Negative.     Objective:  BP 112/76 (BP Location: Left Arm, Patient Position: Sitting, Cuff Size: Large)   Pulse 78   Temp 98.4 F (36.9 C) (Oral)   Resp 16   Ht 6' (1.829 m)   Wt 253 lb 4 oz (114.9 kg)   SpO2 97%   BMI 34.35 kg/m   BP Readings from Last 3 Encounters:  02/27/20 112/76  02/22/20 132/88  02/12/20 114/80    Wt Readings from Last 3 Encounters:  02/27/20 253 lb 4 oz (114.9 kg)  02/22/20 253 lb 3.2 oz (114.9 kg)  02/12/20 259 lb 3.2 oz (117.6 kg)    Physical Exam Vitals reviewed.  HENT:  Nose: Nose normal.     Mouth/Throat:     Mouth: Mucous membranes are moist.  Eyes:     General: No scleral icterus.    Conjunctiva/sclera: Conjunctivae normal.  Cardiovascular:     Rate and Rhythm: Normal rate and regular rhythm.     Heart sounds: No murmur.  Pulmonary:     Effort: Pulmonary effort is normal.     Breath sounds: No stridor. No wheezing, rhonchi or rales.  Abdominal:     General: Abdomen is protuberant. Bowel sounds are normal. There is no distension.     Palpations: Abdomen is soft. There is no hepatomegaly,  splenomegaly or mass.  Musculoskeletal:        General: Normal range of motion.     Cervical back: Neck supple.     Right lower leg: No edema.     Left lower leg: No edema.  Lymphadenopathy:     Cervical: No cervical adenopathy.  Skin:    General: Skin is warm and dry.     Coloration: Skin is not pale.  Neurological:     General: No focal deficit present.     Mental Status: He is alert.     Lab Results  Component Value Date   WBC 5.0 02/27/2020   HGB 12.2 (L) 02/27/2020   HCT 36.8 (L) 02/27/2020   PLT 207.0 02/27/2020   GLUCOSE 111 (H) 12/18/2019   CHOL 158 02/27/2020   TRIG 89.0 02/27/2020   HDL 33.50 (L) 02/27/2020   LDLCALC 107 (H) 02/27/2020   ALT 19 08/13/2019   AST 33 08/13/2019   NA 135 12/18/2019   K 3.8 12/18/2019   CL 102 12/18/2019   CREATININE 1.16 12/18/2019   BUN 15 12/18/2019   CO2 28 12/18/2019   TSH 1.94 12/18/2019   PSA 1.1 12/18/2019   INR 1.47 10/31/2012   HGBA1C 6.0 12/18/2019   MICROALBUR <0.7 12/20/2019    CT Chest Wo Contrast  Result Date: 11/29/2019 CLINICAL DATA:  Shortness of breath and productive cough 2 months. Patient has been on antibiotics without improvement. Negative COVID-19 test. EXAM: CT CHEST WITHOUT CONTRAST TECHNIQUE: Multidetector CT imaging of the chest was performed following the standard protocol without IV contrast. COMPARISON:  None. FINDINGS: Cardiovascular: Heart is normal in size. Calcified plaque is present over the left anterior descending and lateral circumflex coronary arteries. Previous median sternotomy. Thoracic aorta is normal caliber. Remaining vascular structures are unremarkable. Mediastinum/Nodes: No mediastinal or hilar adenopathy. Remaining mediastinal structures are normal. Lungs/Pleura: Lungs are adequately inflated with minimal linear density over the posterior left upper lobe abutting the major fissure likely atelectasis/scarring. 2 mm subpleural nodule over the anterolateral left upper lobe. No focal  lobar consolidation or effusion. Airways are normal. Upper Abdomen: No acute findings. Minimal diverticulosis of the colon. Musculoskeletal: Mild degenerative change of the spine IMPRESSION: 1. No acute cardiopulmonary disease. Minimal linear atelectasis/scarring in the posterior left upper lobe abutting the major fissure. 2.  Mild atherosclerotic coronary artery disease. 3.  Mild colonic diverticulosis. 2. Electronically Signed   By: Marin Olp M.D.   On: 11/29/2019 13:38    Assessment & Plan:   Jahmeek was seen today for anemia and hypertension.  Diagnoses and all orders for this visit:  Essential hypertension- His blood pressure is adequately well controlled.  Dietary folate deficiency anemia- I will monitor his folate level. -     CBC with Differential/Platelet -     Folate  Thiamine deficiency- I will monitor his thiamine  level.   -     CBC with Differential/Platelet -     Vitamin B1  Hyperlipidemia LDL goal <70- He has achieved his LDL goal and is doing well on the statin. -     Lipid panel  Colon cancer screening -     Cologuard  Excessive daytime sleepiness- I will treat this with armodafinil and will refer for sleep evaluation.  I will also monitor him for hypogonadism. -     Ambulatory referral to Sleep Studies -     Armodafinil 150 MG tablet; Take 1 tablet (150 mg total) by mouth daily. -     Testosterone Total,Free,Bio, Males  Deficiency anemia- I will screen for iron deficiency and hemoglobinopathy. -     IBC panel -     Ferritin -     Hemoglobinopathy Evaluation   I have discontinued Leonette Most E. Gillean's prednisoLONE acetate. I am also having him start on Armodafinil. Additionally, I am having him maintain his omeprazole, hydrOXYzine, triamcinolone ointment, tacrolimus, brimonidine, aspirin, Trelegy Ellipta, rosuvastatin, and carvedilol.  Meds ordered this encounter  Medications  . Armodafinil 150 MG tablet    Sig: Take 1 tablet (150 mg total) by mouth daily.     Dispense:  90 tablet    Refill:  1     Follow-up: Return in about 3 months (around 05/29/2020).  Sanda Linger, MD

## 2020-02-27 NOTE — Patient Instructions (Signed)
Anemia  Anemia is a condition in which you do not have enough red blood cells or hemoglobin. Hemoglobin is a substance in red blood cells that carries oxygen. When you do not have enough red blood cells or hemoglobin (are anemic), your body cannot get enough oxygen and your organs may not work properly. As a result, you may feel very tired or have other problems. What are the causes? Common causes of anemia include:  Excessive bleeding. Anemia can be caused by excessive bleeding inside or outside the body, including bleeding from the intestine or from periods in women.  Poor nutrition.  Long-lasting (chronic) kidney, thyroid, and liver disease.  Bone marrow disorders.  Cancer and treatments for cancer.  HIV (human immunodeficiency virus) and AIDS (acquired immunodeficiency syndrome).  Treatments for HIV and AIDS.  Spleen problems.  Blood disorders.  Infections, medicines, and autoimmune disorders that destroy red blood cells. What are the signs or symptoms? Symptoms of this condition include:  Minor weakness.  Dizziness.  Headache.  Feeling heartbeats that are irregular or faster than normal (palpitations).  Shortness of breath, especially with exercise.  Paleness.  Cold sensitivity.  Indigestion.  Nausea.  Difficulty sleeping.  Difficulty concentrating. Symptoms may occur suddenly or develop slowly. If your anemia is mild, you may not have symptoms. How is this diagnosed? This condition is diagnosed based on:  Blood tests.  Your medical history.  A physical exam.  Bone marrow biopsy. Your health care provider may also check your stool (feces) for blood and may do additional testing to look for the cause of your bleeding. You may also have other tests, including:  Imaging tests, such as a CT scan or MRI.  Endoscopy.  Colonoscopy. How is this treated? Treatment for this condition depends on the cause. If you continue to lose a lot of blood, you may  need to be treated at a hospital. Treatment may include:  Taking supplements of iron, vitamin S31, or folic acid.  Taking a hormone medicine (erythropoietin) that can help to stimulate red blood cell growth.  Having a blood transfusion. This may be needed if you lose a lot of blood.  Making changes to your diet.  Having surgery to remove your spleen. Follow these instructions at home:  Take over-the-counter and prescription medicines only as told by your health care provider.  Take supplements only as told by your health care provider.  Follow any diet instructions that you were given.  Keep all follow-up visits as told by your health care provider. This is important. Contact a health care provider if:  You develop new bleeding anywhere in the body. Get help right away if:  You are very weak.  You are short of breath.  You have pain in your abdomen or chest.  You are dizzy or feel faint.  You have trouble concentrating.  You have bloody or black, tarry stools.  You vomit repeatedly or you vomit up blood. Summary  Anemia is a condition in which you do not have enough red blood cells or enough of a substance in your red blood cells that carries oxygen (hemoglobin).  Symptoms may occur suddenly or develop slowly.  If your anemia is mild, you may not have symptoms.  This condition is diagnosed with blood tests as well as a medical history and physical exam. Other tests may be needed.  Treatment for this condition depends on the cause of the anemia. This information is not intended to replace advice given to you by  your health care provider. Make sure you discuss any questions you have with your health care provider. Document Revised: 11/19/2017 Document Reviewed: 01/08/2017 Elsevier Patient Education  [place].

## 2020-02-28 ENCOUNTER — Encounter: Payer: Self-pay | Admitting: Internal Medicine

## 2020-03-04 ENCOUNTER — Encounter: Payer: Self-pay | Admitting: Internal Medicine

## 2020-03-04 LAB — VITAMIN B1: Vitamin B1 (Thiamine): 7 nmol/L — ABNORMAL LOW (ref 8–30)

## 2020-03-06 DIAGNOSIS — Z7189 Other specified counseling: Secondary | ICD-10-CM | POA: Insufficient documentation

## 2020-03-06 DIAGNOSIS — I255 Ischemic cardiomyopathy: Secondary | ICD-10-CM | POA: Insufficient documentation

## 2020-03-06 DIAGNOSIS — E118 Type 2 diabetes mellitus with unspecified complications: Secondary | ICD-10-CM | POA: Insufficient documentation

## 2020-03-06 NOTE — Progress Notes (Signed)
Cardiology Office Note   Date:  03/07/2020   ID:  Bradley Watson, DOB [birth year], MRN [id number]  PCP:  [name], MD  Cardiologist:   No primary care provider on file. Referring:  [name], MD  Chief Complaint  Patient presents with  . Coronary Artery Disease      History of Present Illness: Bradley Watson is a 59 y.o. male who is referred by [name], MD for evaluation of a reduced EF.  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  He had LIMA to the LAD, left radial to the obtuse marginal. Echocardiogram November 2013 showed normal LV function, grade 1 diastolic dysfunction and mild left atrial enlargement. Nuclear study in June of 2015 showed an ejection fraction of 52%. Apical thinning but no ischemia. He had a repeat Lexiscan Myoview ordered by [name], MD last week.  This demonstrated a small apical reversible defect with an EF that was slightly lower than previous.    Echocardiogram dated 02/02/2020 revealed reduced EF of 45% to 50%, the left ventricle did have some mildly decreased function.  He was found to have grade 1 diastolic dysfunction.  RV systolic function was also moderately reduced.  The patient is done exceptionally well since we last saw him.  He is lost quite a bit of weight.  He started walking now routinely.  He was very fatigued but he feels better recently.  He started taking some over-the-counter iron and he thinks that made a difference.  He denies any chest pressure, neck or arm discomfort.  He had no new shortness of breath, PND or orthopnea.  He has had no weight gain or edema.  Past Medical History:  Diagnosis Date  . Carotid stenosis    Pre-CABG Dopplers: RICA 40-59%;  Carotid US (05/2014):  R 1-39%; normal LICA  . Cataract    OU  . Childhood asthma   . Cluster headaches 1985   Mid 80's  . Coronary artery disease    a.  LHC 10/27/12 demonstrated pLAD 70%, AV CFX occluded, pOM 80%, dCFX collateralized the LAD and RCA, RCA without  obstructive lesions, EF 50% with inferobasal HK;  b.  Echo 10/30/12: EF 55-60%, grade 1 diastolic dysfunction, mild LAE, normal LV wall motion;  c. s/p CABG 10/31/12 with Dr. Dorris Fetch:  L-LAD, left radial-OM;   b.  ETT-Nuc (05/2014):  + ECG changes, no ischemia; low risk  . Diverticulitis 02-2012   CT scan   . Fatty liver 02-2012   Mild- CT scan   . GERD (gastroesophageal reflux disease)   . HLD (hyperlipidemia)   . Iron deficiency anemia 1980's  . Type II diabetes mellitus with manifestations [facility])     Past Surgical History:  Procedure Laterality Date  . CARDIAC CATHETERIZATION  10/27/2012  . cataract  2020  . CORONARY ARTERY BYPASS GRAFT  10/31/2012   Procedure: CORONARY ARTERY BYPASS GRAFTING (CABG);  Surgeon: Loreli Slot, MD;  Location[facility];  Service: Open Heart Surgery;  Laterality: N/A;  . LEFT HEART CATHETERIZATION WITH CORONARY ANGIOGRAM N/A 10/27/2012   Procedure: LEFT HEART CATHETERIZATION WITH CORONARY ANGIOGRAM;  Surgeon: Herby Abraham, MD;  Location: [facility] CATH LAB;  Service: Cardiovascular;  Laterality: N/A;  . RADIAL ARTERY HARVEST  10/31/2012   Procedure: RADIAL ARTERY HARVEST;  Surgeon: Loreli Slot, MD;  Location: [facility] OR;  Service: Open Heart Surgery;  Laterality: Left;  . RADIAL KERATOTOMY  1985   bilaterally  Current Outpatient Medications  Medication Sig Dispense Refill  . Armodafinil 150 MG tablet Take 1 tablet (150 mg total) by mouth daily. 90 tablet 1  . aspirin 81 MG EC tablet Take 1 tablet (81 mg total) by mouth daily. 90 tablet 1  . brimonidine (ALPHAGAN) 0.2 % ophthalmic solution Place 1 drop into both eyes 3 (three) times daily. (Patient taking differently: Place 1 drop into both eyes 2 (two) times daily. ) 10 mL 10  . carvedilol (COREG) 3.125 MG tablet Take 1 tablet (3.125 mg total) by mouth 2 (two) times daily with a meal. 180 tablet 3  . Fluticasone-Umeclidin-Vilant (TRELEGY ELLIPTA) 200-62.5-25 MCG/INH AEPB Inhale 1 puff into the  lungs daily. 120 each 1  . hydrOXYzine (ATARAX/VISTARIL) 10 MG tablet Take 30-60 mg by mouth daily as needed for itching.     Marland Kitchen omeprazole (PRILOSEC) 20 MG capsule Take 20 mg by mouth daily as needed (heartburn).     . rosuvastatin (CRESTOR) 20 MG tablet Take 1 tablet (20 mg total) by mouth daily. 90 tablet 3  . tacrolimus (PROTOPIC) 0.1 % ointment APP EXT TO THE SKIN BID    . triamcinolone ointment (KENALOG) 0.1 % APPLY ON THE SKIN TWICE A DAY AS NEEDED     No current facility-administered medications for this visit.    Allergies:   Penicillins and Lisinopril    ROS:  Please see the history of present illness.   Otherwise, review of systems are positive for none.   All other systems are reviewed and negative.    PHYSICAL EXAM: VS:  BP 120/80   Pulse 78   Temp (!) 97 F (36.1 C)   Ht 6' (1.829 m)   Wt 252 lb (114.3 kg)   SpO2 93%   BMI 34.18 kg/m  , BMI Body mass index is 34.18 kg/m. GENERAL:  Well appearing NECK:  No jugular venous distention, waveform within normal limits, carotid upstroke brisk and symmetric, no bruits, no thyromegaly LUNGS:  Clear to auscultation bilaterally CHEST:  Well healed sternotomy scar. HEART:  PMI not displaced or sustained,S1 and S2 within normal limits, no S3, no S4, no clicks, no rubs, no murmurs ABD:  Flat, positive bowel sounds normal in frequency in pitch, no bruits, no rebound, no guarding, no midline pulsatile mass, no hepatomegaly, no splenomegaly EXT:  2 plus pulses throughout, no edema, no cyanosis no clubbing   EKG:  EKG is  ordered today. The ekg ordered today demonstrates sinus rhythm, rate 78, axis within normal limits, intervals within normal limits, no acute ST-T wave changes.   Recent Labs: 08/13/2019: ALT 19 12/18/2019: BUN 15; Creatinine, Ser 1.16; Potassium 3.8; Sodium 135; TSH 1.94 02/27/2020: Hemoglobin 12.2; Platelets 207.0    Lipid Panel    Component Value Date/Time   CHOL 158 02/27/2020 0855   CHOL 296 (H)  01/22/2020 1200   TRIG 89.0 02/27/2020 0855   HDL 33.50 (L) 02/27/2020 0855   HDL 44 01/22/2020 1200   CHOLHDL 5 02/27/2020 0855   VLDL 17.8 02/27/2020 0855   LDLCALC 107 (H) 02/27/2020 0855   LDLCALC 227 (H) 01/22/2020 1200      Wt Readings from Last 3 Encounters:  03/07/20 252 lb (114.3 kg)  02/27/20 253 lb 4 oz (114.9 kg)  02/22/20 253 lb 3.2 oz (114.9 kg)      Other studies Reviewed: Additional studies/ records that were reviewed today include: Labs Review of the above records demonstrates:  Please see elsewhere in the note.  ASSESSMENT AND PLAN:  CAD: The patient has no new sypmtoms.  No further cardiovascular testing is indicated.  We will continue with aggressive risk reduction and meds as listed.  ISCHEMIC CARDIOMYOPATHY:    EF on perfusion imaging was reduced as above.   He has no symptoms.  No change in therapy.  HTN:   The blood pressure is well controlled.  No change in therapy.   DM: A1c was 6.0.  No change in therapy.  DYSLIPIDEMIA: LDL most recently was  236.   He is being followed in the Lipid Clinic.  His LDL improved markedly to 107 with low dose Crestor and diet.   He will continue with medical management and I will defer follow-up to Dr. Rennis Golden.   CAROTID STENOSIS: He had very mild plaque in 2017.  No further imaging.   COVID EDUCATION: We talked about this and he does qualify for the vaccine now and he will try to sign up.    Current medicines are reviewed at length with the patient today.  The patient does not have concerns regarding medicines.  The following changes have been made:  no change  Labs/ tests ordered today include:   Orders Placed This Encounter  Procedures  . EKG 12-Lead     Disposition:   FU with me after the above studies.     Signed, Rollene Rotunda, MD  03/07/2020 11:39 AM    [facility]

## 2020-03-07 ENCOUNTER — Encounter: Payer: Self-pay | Admitting: Cardiology

## 2020-03-07 ENCOUNTER — Ambulatory Visit (INDEPENDENT_AMBULATORY_CARE_PROVIDER_SITE_OTHER): Payer: PRIVATE HEALTH INSURANCE | Admitting: Cardiology

## 2020-03-07 ENCOUNTER — Other Ambulatory Visit: Payer: Self-pay

## 2020-03-07 VITALS — BP 120/80 | HR 78 | Temp 97.0°F | Ht 72.0 in | Wt 252.0 lb

## 2020-03-07 DIAGNOSIS — I255 Ischemic cardiomyopathy: Secondary | ICD-10-CM

## 2020-03-07 DIAGNOSIS — I2510 Atherosclerotic heart disease of native coronary artery without angina pectoris: Secondary | ICD-10-CM

## 2020-03-07 DIAGNOSIS — I10 Essential (primary) hypertension: Secondary | ICD-10-CM

## 2020-03-07 DIAGNOSIS — E118 Type 2 diabetes mellitus with unspecified complications: Secondary | ICD-10-CM

## 2020-03-07 DIAGNOSIS — E785 Hyperlipidemia, unspecified: Secondary | ICD-10-CM

## 2020-03-07 DIAGNOSIS — Z7189 Other specified counseling: Secondary | ICD-10-CM

## 2020-03-07 NOTE — Patient Instructions (Signed)
Medication Instructions:  No changes *If you need a refill on your cardiac medications before your next appointment, please call your pharmacy*   Lab Work: None ordered If you have labs (blood work) drawn today and your tests are completely normal, you will receive your results only by: Marland Kitchen MyChart Message (if you have MyChart) OR . A paper copy in the mail If you have any lab test that is abnormal or we need to change your treatment, we will call you to review the results.   Testing/Procedures: None ordered   Follow-Up: At [facility], you and your health needs are our priority.  As part of our continuing mission to provide you with exceptional heart care, we have created designated Provider Care Teams.  These Care Teams include your primary Cardiologist (physician) and Advanced Practice Providers (APPs -  Physician Assistants and Nurse Practitioners) who all work together to provide you with the care you need, when you need it.  We recommend signing up for the patient portal called "MyChart".  Sign up information is provided on this After Visit Summary.  MyChart is used to connect with patients for Virtual Visits (Telemedicine).  Patients are able to view lab/test results, encounter notes, upcoming appointments, etc.  Non-urgent messages can be sent to your provider as well.   To learn more about what you can do with MyChart, go to [email or web address].    Your next appointment:   12 month(s)  The format for your next appointment:   In Person  Provider:   You may see Dr. Antoine Poche or one of the following Advanced Practice Providers on your designated Care Team:    Theodore Demark, PA-C  Joni Reining, DNP, ANP  Cadence Fransico Michael, NP

## 2020-03-15 LAB — COLOGUARD: Cologuard: NEGATIVE

## 2020-03-21 LAB — COLOGUARD: Cologuard: NEGATIVE

## 2020-03-25 LAB — COLOGUARD: COLOGUARD: NEGATIVE

## 2020-03-25 LAB — EXTERNAL GENERIC LAB PROCEDURE: COLOGUARD: NEGATIVE

## 2020-03-28 ENCOUNTER — Ambulatory Visit: Payer: PRIVATE HEALTH INSURANCE | Admitting: Cardiology

## 2020-03-29 ENCOUNTER — Encounter: Payer: Self-pay | Admitting: Internal Medicine

## 2020-04-02 ENCOUNTER — Encounter: Payer: Self-pay | Admitting: Internal Medicine

## 2020-04-15 ENCOUNTER — Telehealth: Payer: Self-pay

## 2020-04-15 NOTE — Telephone Encounter (Signed)
New message  Seen 3.18.21  The patient calling asking can Testerone medicine be prescribe it was discussed at the last office visit   [facility] [phone] - [place], [place] - [address]

## 2020-04-18 NOTE — Telephone Encounter (Signed)
F/u  The patient is asking the CMA to call him back to discuss Testerone.   The patient voiced he has been having trouble with his my chart account.   My chart phone # was given to the patient

## 2020-04-22 NOTE — Telephone Encounter (Signed)
Lvm for pt to call back to discuss  

## 2020-04-23 NOTE — Addendum Note (Signed)
Addended by: Radford Pax M on: 04/23/2020 10:45 AM   Modules accepted: Orders

## 2020-05-29 ENCOUNTER — Ambulatory Visit: Payer: PRIVATE HEALTH INSURANCE | Admitting: Internal Medicine

## 2020-06-26 ENCOUNTER — Other Ambulatory Visit: Payer: Self-pay | Admitting: Internal Medicine

## 2020-06-26 DIAGNOSIS — I2510 Atherosclerotic heart disease of native coronary artery without angina pectoris: Secondary | ICD-10-CM

## 2020-06-26 DIAGNOSIS — I10 Essential (primary) hypertension: Secondary | ICD-10-CM

## 2020-06-26 DIAGNOSIS — E118 Type 2 diabetes mellitus with unspecified complications: Secondary | ICD-10-CM

## 2020-07-18 ENCOUNTER — Telehealth: Payer: Self-pay | Admitting: Cardiology

## 2020-07-18 NOTE — Telephone Encounter (Signed)
LMTCB  (Pt has appt 08/27/20 with Dr. Rennis Golden Cancelled appts for April and June 2021)

## 2020-07-18 NOTE — Telephone Encounter (Signed)
Pt c/o Shortness Of Breath: STAT if SOB developed within the last 24 hours or pt is noticeably SOB on the phone  1. Are you currently SOB (can you hear that pt is SOB on the phone)? No  2. How long have you been experiencing SOB? About 2 months  3. Are you SOB when sitting or when up moving around? When resting  4. Are you currently experiencing any other symptoms? Patient states he has had a bad cough and SOB for the past few months. He states he has taken multiple medications and nothing is working. Denies CP or additional symptoms. Please return call to discuss.

## 2020-07-19 NOTE — Telephone Encounter (Signed)
Left message for pt to call.

## 2020-07-25 NOTE — Telephone Encounter (Signed)
Left message for pt to call.

## 2020-08-27 ENCOUNTER — Ambulatory Visit: Payer: PRIVATE HEALTH INSURANCE | Admitting: Internal Medicine

## 2020-08-27 ENCOUNTER — Encounter: Payer: Self-pay | Admitting: Internal Medicine

## 2020-08-27 ENCOUNTER — Other Ambulatory Visit: Payer: Self-pay

## 2020-08-27 VITALS — BP 120/60 | HR 70 | Ht 72.0 in | Wt 193.0 lb

## 2020-08-27 DIAGNOSIS — E782 Mixed hyperlipidemia: Secondary | ICD-10-CM

## 2020-08-27 DIAGNOSIS — Z951 Presence of aortocoronary bypass graft: Secondary | ICD-10-CM

## 2020-08-27 DIAGNOSIS — E118 Type 2 diabetes mellitus with unspecified complications: Secondary | ICD-10-CM

## 2020-08-27 DIAGNOSIS — Z79899 Other long term (current) drug therapy: Secondary | ICD-10-CM

## 2020-08-27 DIAGNOSIS — I2510 Atherosclerotic heart disease of native coronary artery without angina pectoris: Secondary | ICD-10-CM

## 2020-08-27 NOTE — Patient Instructions (Signed)
Medication Instructions:  Your Physician recommend you continue on your current medication as directed.    *If you need a refill on your cardiac medications before your next appointment, please call your pharmacy*   Lab Work: Your physician recommends that you return for lab work today (Lipid, LPa)  If you have labs (blood work) drawn today and your tests are completely normal, you will receive your results only by: Marland Kitchen MyChart Message (if you have MyChart) OR . A paper copy in the mail If you have any lab test that is abnormal or we need to change your treatment, we will call you to review the results.   Testing/Procedures: None ordered     Follow-Up: At [facility], you and your health needs are our priority.  As part of our continuing mission to provide you with exceptional heart care, we have created designated Provider Care Teams.  These Care Teams include your primary Cardiologist (physician) and Advanced Practice Providers (APPs -  Physician Assistants and Nurse Practitioners) who all work together to provide you with the care you need, when you need it.  We recommend signing up for the patient portal called "MyChart".  Sign up information is provided on this After Visit Summary.  MyChart is used to connect with patients for Virtual Visits (Telemedicine).  Patients are able to view lab/test results, encounter notes, upcoming appointments, etc.  Non-urgent messages can be sent to your provider as well.   To learn more about what you can do with MyChart, go to [email or web address].    Your next appointment:   1 year(s)  The format for your next appointment:   In Person  Provider:   K. Italy Hilty, MD

## 2020-08-27 NOTE — Progress Notes (Signed)
LIPID CLINIC CONSULT NOTE  Chief Complaint:  Manage dyslipidemia  Primary Care Physician: Bradley Grandchild, MD  Primary Cardiologist:  Rollene Rotunda, MD  HPI:  Bradley Watson is a 59 y.o. male who is being seen today for the evaluation of dyslipidemia at the request of Bradley Sheriff, MD. This is a pleasant 59 year old male with a longstanding history of dyslipidemia and unfortunately early onset heart disease.  He underwent CABG previously and has struggled with dyslipidemia.  He is also struggled with significant obesity and dietary challenges.  Most recently he had a lipid profile which showed a total cholesterol of 296, triglycerides 134, HDL 44 and LDL 227.  He had previously been on rosuvastatin however was recently not taking it.  As of the fact past several weeks he restarted taking the medication.  He is also on aspirin and some carvedilol.  The lipids are certainly concerning for probable familial hyperlipidemia, especially given his early onset heart disease however he is adopted therefore we do not really know his biologic family history.  In addition he does not have any children or plan to.  Genetic testing in this situation would not be of much benefit.  Target LDL of course is much lower at least a 50% reduction or if achievable try to target LDL less than 70.  08/27/2020  [name] seen today in follow-up.  He had repeat lipids about 2 months after his previous numbers which showed marked decline in cholesterol from 320-158, triglycerides decreased from 160-89, HDL 33 and LDL cholesterol 107 (down from 236).  He had made significant dietary changes as well as started on high potency rosuvastatin 20 mg daily.  He continues on this.  I had recommended repeat labs which we will check today.  His target LDL is less than 70 and he may have achieved that with further weight loss.  He is actually down another 60 pounds since I saw him down to 193 pounds currently which is almost at  ideal body weight.  PMHx:  Past Medical History:  Diagnosis Date  . Carotid stenosis    Pre-CABG Dopplers: RICA 40-59%;  Carotid US (05/2014):  R 1-39%; normal LICA  . Cataract    OU  . Childhood asthma   . Cluster headaches 1985   Mid 80's  . Coronary artery disease    a.  LHC 10/27/12 demonstrated pLAD 70%, AV CFX occluded, pOM 80%, dCFX collateralized the LAD and RCA, RCA without obstructive lesions, EF 50% with inferobasal HK;  b.  Echo 10/30/12: EF 55-60%, grade 1 diastolic dysfunction, mild LAE, normal LV wall motion;  c. s/p CABG 10/31/12 with Dr. Dorris Fetch:  L-LAD, left radial-OM;   b.  ETT-Nuc (05/2014):  + ECG changes, no ischemia; low risk  . Diverticulitis 02-2012   CT scan   . Fatty liver 02-2012   Mild- CT scan   . GERD (gastroesophageal reflux disease)   . HLD (hyperlipidemia)   . Iron deficiency anemia 1980's  . Type II diabetes mellitus with manifestations [facility])     Past Surgical History:  Procedure Laterality Date  . CARDIAC CATHETERIZATION  10/27/2012  . cataract  2020  . CORONARY ARTERY BYPASS GRAFT  10/31/2012   Procedure: CORONARY ARTERY BYPASS GRAFTING (CABG);  Surgeon: Loreli Slot, MD;  Location: [facility] OR;  Service: Open Heart Surgery;  Laterality: N/A;  . LEFT HEART CATHETERIZATION WITH CORONARY ANGIOGRAM N/A 10/27/2012   Procedure: LEFT HEART CATHETERIZATION WITH CORONARY ANGIOGRAM;  Surgeon:  Herby Abraham, MD;  Location: [facility] CATH LAB;  Service: Cardiovascular;  Laterality: N/A;  . RADIAL ARTERY HARVEST  10/31/2012   Procedure: RADIAL ARTERY HARVEST;  Surgeon: Loreli Slot, MD;  Location[facility] OR;  Service: Open Heart Surgery;  Laterality: Left;  . RADIAL KERATOTOMY  1985   bilaterally    FAMHx:  Family History  Problem Relation Age of Onset  . Other Mother        car accident   . Lung cancer Father   . Diabetes Mellitus II Paternal Grandmother     SOCHx:   reports that he has quit smoking. He has a 0.18 pack-year smoking history.  He has never used smokeless tobacco. He reports current alcohol use. He reports that he does not use drugs.  ALLERGIES:  Allergies  Allergen Reactions  . Penicillins Anxiety and Other (See Comments)    "Made me feel like gravity had increased and it was pulling me down" (10/27/2012) Did it involve swelling of the face/tongue/throat, SOB, or low BP? No Did it involve sudden or severe rash/hives, skin peeling, or any reaction on the inside of your mouth or nose? No Did you need to seek medical attention at a hospital or doctor's office? No When did it last happen?Over 10 years ago If all above answers are "NO", may proceed with cephalosporin use.  Marland Kitchen Lisinopril Cough    ROS: Pertinent items noted in HPI and remainder of comprehensive ROS otherwise negative.  HOME MEDS: Current Outpatient Medications on File Prior to Visit  Medication Sig Dispense Refill  . aspirin 81 MG EC tablet Take 1 tablet (81 mg total) by mouth daily. 90 tablet 1  . brimonidine (ALPHAGAN) 0.2 % ophthalmic solution Place 1 drop into both eyes 3 (three) times daily. (Patient taking differently: Place 1 drop into both eyes 2 (two) times daily. ) 10 mL 10  . carvedilol (COREG) 3.125 MG tablet Take 1 tablet (3.125 mg total) by mouth 2 (two) times daily with a meal. 180 tablet 3  . hydrOXYzine (ATARAX/VISTARIL) 10 MG tablet Take 30-60 mg by mouth daily as needed for itching.     . rosuvastatin (CRESTOR) 20 MG tablet Take 1 tablet (20 mg total) by mouth daily. 90 tablet 3  . tacrolimus (PROTOPIC) 0.1 % ointment APP EXT TO THE SKIN BID    . triamcinolone ointment (KENALOG) 0.1 % APPLY ON THE SKIN TWICE A DAY AS NEEDED     No current facility-administered medications on file prior to visit.    LABS/IMAGING: No results found for this or any previous visit (from the past 48 hour(s)). No results found.  LIPID PANEL:    Component Value Date/Time   CHOL 158 02/27/2020 0855   CHOL 296 (H) 01/22/2020 1200   TRIG 89.0  02/27/2020 0855   HDL 33.50 (L) 02/27/2020 0855   HDL 44 01/22/2020 1200   CHOLHDL 5 02/27/2020 0855   VLDL 17.8 02/27/2020 0855   LDLCALC 107 (H) 02/27/2020 0855   LDLCALC 227 (H) 01/22/2020 1200    WEIGHTS: Wt Readings from Last 3 Encounters:  08/27/20 193 lb (87.5 kg)  03/07/20 252 lb (114.3 kg)  02/27/20 253 lb 4 oz (114.9 kg)    VITALS: BP 120/60   Pulse 70   Ht 6' (1.829 m)   Wt 193 lb (87.5 kg)   BMI 26.18 kg/m   EXAM: Deferred  EKG: Deferred  ASSESSMENT:  1. 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 Coronary artery disease status post  CABG (premature onset) 3. Morbid obesity with substantial weight loss, now near normal  PLAN: 1.   Bradley Watson has had substantial weight loss and is now near normal weight with a mixed dyslipidemia that is significantly improved on statin therapy.  His goal LDL is less than 70 and may actually reach that based on his significant weight loss and high potency statin therapy.  He does have a history of coronary disease and is status post CABG which was premature onset.  We will check his lipids today as well as of LP(a).  If he is well controlled him follow-up with me can be on an annual basis as he sees Dr. Antoine Poche for general cardiology.  Chrystie Nose, MD, [facility], FACP  [place]  [facility]  Medical Director of the Advanced Lipid Disorders &  Cardiovascular Risk Reduction Clinic Diplomate of the American Board of Clinical Lipidology Attending Cardiologist  Direct Dial: [phone]  Fax: [phone]  Website:  [email or web address] Nicely Jari Carollo 08/27/2020, 10:39 AM

## 2020-08-28 LAB — LIPID PANEL
Chol/HDL Ratio: 3.9 ratio (ref 0.0–5.0)
Cholesterol, Total: 208 mg/dL — ABNORMAL HIGH (ref 100–199)
HDL: 54 mg/dL (ref >39)
LDL Chol Calc (NIH): 138 mg/dL — ABNORMAL HIGH (ref 0–99)
Triglycerides: 89 mg/dL (ref 0–149)
VLDL Cholesterol Cal: 16 mg/dL (ref 5–40)

## 2020-08-28 LAB — LIPOPROTEIN A (LPA): Lipoprotein (a): 203.3 nmol/L — ABNORMAL HIGH (ref <75.0)

## 2020-09-04 ENCOUNTER — Encounter: Payer: Self-pay | Admitting: Internal Medicine

## 2020-10-07 NOTE — Progress Notes (Signed)
Virtual Visit via Video Note  I connected with Bradley Watson on 10/08/20 at  9:45 AM EDT by a video enabled telemedicine application and verified that I am speaking with the correct person using two identifiers.   I discussed the limitations of evaluation and management by telemedicine and the availability of in person appointments. The patient expressed understanding and agreed to proceed.  Present for the visit:  Myself, Dr Cheryll Cockayne, Enid Skeens.  The patient is currently at home and I am in the office.    No referring provider.    History of Present Illness: This is an acute visit for coughing x the last 3 months  His cough started about 3 months ago and initially it was just at night and would last 1-2 hours at night prior to him being able to fall asleep.  Now he has it during the day.  Last night he had a sore throat.  It seems like it is in his throat area.  He gets phlegm there and he can bring it up and spit it out.  The phlegm returns.  When he spits it all out and then it is a dry hacking cough.  He does have some wheezing along with a cough.  At times it feels like he has to work hard to breathe in and out.  He does have a history of asthma as a child and has an inhaler and he has used it and that does help with the wheezing and his breathing, but not the cough.  He has not had any smell or taste for a year or more.  He never had covid.  No one knows why.  He has taken lozenges.    Review of Systems  Constitutional: Negative for chills and fever.  HENT: Negative for congestion and sinus pain.        No PND  Respiratory: Positive for cough and wheezing (goes along with cough). Negative for shortness of breath.   Cardiovascular: Negative for chest pain, palpitations and leg swelling.  Gastrointestinal: Negative for abdominal pain, heartburn and nausea.  Neurological: Negative for dizziness and headaches.      Social History   Socioeconomic History  . Marital  status: Married    Spouse name: Not on file  . Number of children: Not on file  . Years of education: Not on file  . Highest education level: Not on file  Occupational History  . Occupation: Corporate treasurer: [employer]    Comment: Haematologist  Tobacco Use  . Smoking status: Former Smoker    Packs/day: 0.12    Years: 1.50    Pack years: 0.18  . Smokeless tobacco: Never Used  Vaping Use  . Vaping Use: Never used  Substance and Sexual Activity  . Alcohol use: Yes    Comment: occ  . Drug use: No    Types: "Crack" cocaine  . Sexual activity: Not on file  Other Topics Concern  . Not on file  Social History Narrative   Lives with wife.    Social Determinants of Health   Financial Resource Strain:   . Difficulty of Paying Living Expenses: Not on file  Food Insecurity:   . Worried About Programme researcher, broadcasting/film/video in the Last Year: Not on file  . Ran Out of Food in the Last Year: Not on file  Transportation Needs:   . Lack of Transportation (Medical): Not on file  . Lack of  Transportation (Non-Medical): Not on file  Physical Activity:   . Days of Exercise per Week: Not on file  . Minutes of Exercise per Session: Not on file  Stress:   . Feeling of Stress : Not on file  Social Connections:   . Frequency of Communication with Friends and Family: Not on file  . Frequency of Social Gatherings with Friends and Family: Not on file  . Attends Religious Services: Not on file  . Active Member of Clubs or Organizations: Not on file  . Attends Banker Meetings: Not on file  . Marital Status: Not on file     Observations/Objective: Appears well in NAD Breathing normally.  Wheezing audible when he holds the phone close and breathes deeply.  Intermittent dry cough Skin appears warm and dry   Assessment and Plan:  Cough, wheezing, GERD: Acute Cough was the initial symptom is started at night, but now is having a during the day as well.  Has some  associated wheezing No symptoms consistent of an infection, but very concerning for GERD causing possible exacerbation of asthma He does have a history of GERD, but with weight loss he was able to get off his medication He denies any obvious reflux, but likely has silent GERD Asthma exacerbation is mild and improved with the albuterol Start omeprazole 40 mg daily for 2 weeks and then decrease to 20 mg daily-advised to take 30 minutes prior to meal Refilled albuterol-he will use this as needed At this point we will hold off on further treatment of his asthma unless there is no improvement in his cough and wheeze over the next week with the omeprazole He will let us know if the symptoms are not improving  Follow Up Instructions:    I discussed the assessment and treatment plan with the patient. The patient was provided an opportunity to ask questions and all were answered. The patient agreed with the plan and demonstrated an understanding of the instructions.   The patient was advised to call back or seek an in-person evaluation if the symptoms worsen or if the condition fails to improve as anticipated.    Pincus Sanes, MD

## 2020-10-08 ENCOUNTER — Encounter: Payer: Self-pay | Admitting: Internal Medicine

## 2020-10-08 ENCOUNTER — Telehealth (INDEPENDENT_AMBULATORY_CARE_PROVIDER_SITE_OTHER): Payer: PRIVATE HEALTH INSURANCE | Admitting: Internal Medicine

## 2020-10-08 DIAGNOSIS — J4541 Moderate persistent asthma with (acute) exacerbation: Secondary | ICD-10-CM | POA: Diagnosis not present

## 2020-10-08 DIAGNOSIS — K219 Gastro-esophageal reflux disease without esophagitis: Secondary | ICD-10-CM

## 2020-10-08 DIAGNOSIS — R059 Cough, unspecified: Secondary | ICD-10-CM

## 2020-10-08 MED ORDER — ALBUTEROL SULFATE HFA 108 (90 BASE) MCG/ACT IN AERS
2.0000 | INHALATION_SPRAY | Freq: Four times a day (QID) | RESPIRATORY_TRACT | 2 refills | Status: DC | PRN
Start: 2020-10-08 — End: 2024-10-10

## 2020-10-08 MED ORDER — OMEPRAZOLE 20 MG PO CPDR: DELAYED_RELEASE_CAPSULE | ORAL | 3 refills | Status: AC

## 2020-10-16 ENCOUNTER — Encounter (HOSPITAL_BASED_OUTPATIENT_CLINIC_OR_DEPARTMENT_OTHER): Payer: Self-pay | Admitting: Emergency Medicine

## 2020-10-16 ENCOUNTER — Emergency Department (HOSPITAL_BASED_OUTPATIENT_CLINIC_OR_DEPARTMENT_OTHER): Payer: PRIVATE HEALTH INSURANCE

## 2020-10-16 ENCOUNTER — Emergency Department (HOSPITAL_BASED_OUTPATIENT_CLINIC_OR_DEPARTMENT_OTHER)
Admission: EM | Admit: 2020-10-16 | Discharge: 2020-10-16 | Disposition: A | Discharge status: Home / Self Care | Payer: PRIVATE HEALTH INSURANCE | Attending: Emergency Medicine | Admitting: Emergency Medicine

## 2020-10-16 ENCOUNTER — Other Ambulatory Visit: Payer: Self-pay

## 2020-10-16 DIAGNOSIS — E1169 Type 2 diabetes mellitus with other specified complication: Secondary | ICD-10-CM | POA: Insufficient documentation

## 2020-10-16 DIAGNOSIS — J209 Acute bronchitis, unspecified: Secondary | ICD-10-CM | POA: Insufficient documentation

## 2020-10-16 DIAGNOSIS — Z20822 Contact with and (suspected) exposure to covid-19: Secondary | ICD-10-CM | POA: Diagnosis not present

## 2020-10-16 DIAGNOSIS — Z951 Presence of aortocoronary bypass graft: Secondary | ICD-10-CM | POA: Insufficient documentation

## 2020-10-16 DIAGNOSIS — Z87891 Personal history of nicotine dependence: Secondary | ICD-10-CM | POA: Diagnosis not present

## 2020-10-16 DIAGNOSIS — I2510 Atherosclerotic heart disease of native coronary artery without angina pectoris: Secondary | ICD-10-CM | POA: Insufficient documentation

## 2020-10-16 DIAGNOSIS — J45909 Unspecified asthma, uncomplicated: Secondary | ICD-10-CM | POA: Diagnosis not present

## 2020-10-16 DIAGNOSIS — Z794 Long term (current) use of insulin: Secondary | ICD-10-CM | POA: Insufficient documentation

## 2020-10-16 DIAGNOSIS — R059 Cough, unspecified: Secondary | ICD-10-CM | POA: Diagnosis present

## 2020-10-16 DIAGNOSIS — R062 Wheezing: Secondary | ICD-10-CM

## 2020-10-16 LAB — RESPIRATORY PANEL BY RT PCR (FLU A&B, COVID)
Influenza A by PCR: NEGATIVE
Influenza B by PCR: NEGATIVE
SARS Coronavirus 2 by RT PCR: NEGATIVE

## 2020-10-16 MED ORDER — AZITHROMYCIN 250 MG PO TABS
500.0000 mg | ORAL_TABLET | Freq: Once | ORAL | Status: AC
  Administered 2020-10-16: 500 mg via ORAL
  Filled 2020-10-16: qty 2

## 2020-10-16 MED ORDER — BENZONATATE 100 MG PO CAPS: 100.0000 mg | ORAL_CAPSULE | Freq: Three times a day (TID) | ORAL | 0 refills | Status: DC | PRN

## 2020-10-16 MED ORDER — PREDNISONE 20 MG PO TABS: 40.0000 mg | ORAL_TABLET | Freq: Every day | ORAL | 0 refills | Status: AC

## 2020-10-16 MED ORDER — PREDNISONE 50 MG PO TABS
60.0000 mg | ORAL_TABLET | Freq: Once | ORAL | Status: AC
  Administered 2020-10-16: 60 mg via ORAL
  Filled 2020-10-16: qty 1

## 2020-10-16 MED ORDER — AZITHROMYCIN 250 MG PO TABS: 250.0000 mg | ORAL_TABLET | Freq: Every day | ORAL | 0 refills | Status: DC

## 2020-10-16 NOTE — ED Triage Notes (Signed)
Cough x3 months at night.  Now it is starting about 5pm.  Sts he has been diagnosed with reflux and was put on Omeprazole, but it is only getting worse. Cough at night is getting worse.

## 2020-10-16 NOTE — ED Provider Notes (Signed)
Emergency Department Provider Note   I have reviewed the triage vital signs and the nursing notes.   HISTORY  Chief Complaint Cough   HPI Bradley Watson is a 59 y.o. male presents to the emergency department with frequent cough especially at night over the past 3 months.  He has seen his PCP by televisit only because they are not willing to see him in the office due to coughing.  He has not had fever.  He denies any testing performed as an outpatient.  He was empirically started on omeprazole with suspicion that GERD may be causing his symptoms but notes that cough is only getting worse.  He is not experiencing associated chest pain or heart palpitations.  He does occasionally feel short of breath with frequent coughing and takes albuterol inhaler at home when he develops the symptoms.  He has been on cough medications including those which contain codeine with no relief in symptoms.  Denies any new medications.  No abdominal or epigastric pain.  No burning pain into the chest.  No radiation of symptoms or other modifying factors.  Past Medical History:  Diagnosis Date  . Carotid stenosis    Pre-CABG Dopplers: RICA 40-59%;  Carotid US (05/2014):  R 1-39%; normal LICA  . Cataract    OU  . Childhood asthma   . Cluster headaches 1985   Mid 80's  . Coronary artery disease    a.  LHC 10/27/12 demonstrated pLAD 70%, AV CFX occluded, pOM 80%, dCFX collateralized the LAD and RCA, RCA without obstructive lesions, EF 50% with inferobasal HK;  b.  Echo 10/30/12: EF 55-60%, grade 1 diastolic dysfunction, mild LAE, normal LV wall motion;  c. s/p CABG 10/31/12 with Dr. Dorris Fetch:  L-LAD, left radial-OM;   b.  ETT-Nuc (05/2014):  + ECG changes, no ischemia; low risk  . Diverticulitis 02-2012   CT scan   . Fatty liver 02-2012   Mild- CT scan   . GERD (gastroesophageal reflux disease)   . HLD (hyperlipidemia)   . Iron deficiency anemia 1980's  . Type II diabetes mellitus with manifestations  [facility])     Patient Active Problem List   Diagnosis Date Noted  . Ischemic cardiomyopathy 03/06/2020  . Type 2 diabetes mellitus with complication, without Quita Mcgrory-term current use of insulin (HCC) 03/06/2020  . Educated about COVID-19 virus infection 03/06/2020  . Colon cancer screening 02/27/2020  . Excessive daytime sleepiness 02/27/2020  . Cardiac LV ejection fraction of 40-49% 01/17/2020  . Thiamine deficiency 12/24/2019  . Dietary folate deficiency anemia 12/19/2019  . Vitamin D deficiency disease 12/19/2019  . Occult blood in stools 12/18/2019  . Moderate persistent asthma with acute exacerbation 12/18/2019  . Allergic rhinitis 11/03/2019  . Cough 03/31/2017  . Type II diabetes mellitus with manifestations (HCC) 09/29/2016  . Routine general medical examination at a health care facility 09/29/2016  . Eczema, allergic 09/29/2016  . Essential hypertension 05/14/2016  . Coronary atherosclerosis of native coronary artery 11/21/2012  . Hyperlipidemia LDL goal <70 10/28/2012  . GERD (gastroesophageal reflux disease) 10/24/2012    Past Surgical History:  Procedure Laterality Date  . CARDIAC CATHETERIZATION  10/27/2012  . cataract  2020  . CORONARY ARTERY BYPASS GRAFT  10/31/2012   Procedure: CORONARY ARTERY BYPASS GRAFTING (CABG);  Surgeon: Loreli Slot, MD;  Location[facility] OR;  Service: Open Heart Surgery;  Laterality: N/A;  . LEFT HEART CATHETERIZATION WITH CORONARY ANGIOGRAM N/A 10/27/2012   Procedure: LEFT HEART CATHETERIZATION WITH CORONARY  ANGIOGRAM;  Surgeon: Herby Abraham, MD;  Location: [facility] CATH LAB;  Service: Cardiovascular;  Laterality: N/A;  . RADIAL ARTERY HARVEST  10/31/2012   Procedure: RADIAL ARTERY HARVEST;  Surgeon: Loreli Slot, MD;  Location: [facility] OR;  Service: Open Heart Surgery;  Laterality: Left;  . RADIAL KERATOTOMY  1985   bilaterally    Allergies Penicillins and Lisinopril  Family History  Problem Relation Age of Onset  . Other Mother         car accident   . Lung cancer Father   . Diabetes Mellitus II Paternal Grandmother     Social History Social History   Tobacco Use  . Smoking status: Former Smoker    Packs/day: 0.12    Years: 1.50    Pack years: 0.18  . Smokeless tobacco: Never Used  Vaping Use  . Vaping Use: Never used  Substance Use Topics  . Alcohol use: Yes    Comment: occ  . Drug use: No    Types: "Crack" cocaine    Review of Systems  Constitutional: No fever/chills Eyes: No visual changes. ENT: No sore throat. Cardiovascular: Denies chest pain. Respiratory: Occasional shortness of breath and cough.  Gastrointestinal: No abdominal pain.  No nausea, no vomiting.  No diarrhea.  No constipation. Genitourinary: Negative for dysuria. Musculoskeletal: Negative for back pain. Skin: Negative for rash. Neurological: Negative for headaches, focal weakness or numbness.  10-point ROS otherwise negative.  ____________________________________________   PHYSICAL EXAM:  VITAL SIGNS: ED Triage Vitals  Enc Vitals Group     BP 10/16/20 1011 (!) 140/94     Pulse Rate 10/16/20 1011 62     Resp 10/16/20 1011 16     Temp 10/16/20 1011 97.8 F (36.6 C)     Temp Source 10/16/20 1011 Oral     SpO2 10/16/20 1011 100 %     Weight 10/16/20 1012 206 lb 3.2 oz (93.5 kg)     Height 10/16/20 1012 6' (1.829 m)   Constitutional: Alert and oriented. Well appearing and in no acute distress. Eyes: Conjunctivae are normal.  Head: Atraumatic. Nose: No congestion/rhinnorhea. Mouth/Throat: Mucous membranes are moist. Neck: No stridor.  Cardiovascular: Normal rate, regular rhythm. Good peripheral circulation. Grossly normal heart sounds.   Respiratory: Normal respiratory effort.  No retractions. Lungs with end-expiratory wheezing bilaterally on exam. No rales or rhonchi. Good air entry. Occasional bronchospastic cough.  Gastrointestinal: Soft and nontender. No distention.  Musculoskeletal: No lower extremity tenderness  nor edema. No gross deformities of extremities. Neurologic:  Normal speech and language.  Skin:  Skin is warm, dry and intact. No rash noted.   ____________________________________________   LABS (all labs ordered are listed, but only abnormal results are displayed)  Labs Reviewed  RESPIRATORY PANEL BY RT PCR (FLU A&B, COVID)   ____________________________________________  RADIOLOGY  DG Chest Portable 1 View  Result Date: 10/16/2020 CLINICAL DATA:  Cough EXAM: PORTABLE CHEST 1 VIEW COMPARISON:  November 22, 2019 chest radiograph; chest CT November 29, 2019 FINDINGS: Lungs are clear. Heart size and pulmonary vascularity are normal. Patient is status post coronary artery bypass grafting. No adenopathy. No pneumothorax. No bone lesions. IMPRESSION: Lungs clear. Stable cardiac silhouette. Status post coronary artery bypass grafting. Electronically Signed   By: Bretta Bang III M.D.   On: 10/16/2020 10:47    ____________________________________________   PROCEDURES  Procedure(s) performed:   Procedures  None  ____________________________________________   INITIAL IMPRESSION / ASSESSMENT AND PLAN / ED COURSE  Pertinent labs &  imaging results that were available during my care of the patient were reviewed by me and considered in my medical decision making (see chart for details).   Patient presents to the emergency department valuation of frequent coughing.  I do appreciate wheezing on exam with bronchospastic cough.  Plan to start steroids here along with a azithromycin.  Chest x-ray is clear.  Covid screen is negative.  Hopefully after negative Covid screen today the patient can begin having an office visits for evaluation of symptoms.   At this time, I do not feel there is any life-threatening condition present. I have reviewed and discussed all results (EKG, imaging, lab, urine as appropriate), exam findings with patient. I have reviewed nursing notes and appropriate  previous records.  I feel the patient is safe to be discharged home without further emergent workup. Discussed usual and customary return precautions. Patient and family (if present) verbalize understanding and are comfortable with this plan.  Patient will follow-up with their primary care provider. If they do not have a primary care provider, information for follow-up has been provided to them. All questions have been answered.  ____________________________________________  FINAL CLINICAL IMPRESSION(S) / ED DIAGNOSES  Final diagnoses:  Acute bronchitis, unspecified organism  Wheezing     MEDICATIONS GIVEN DURING THIS VISIT:  Medications  predniSONE (DELTASONE) tablet 60 mg (60 mg Oral Given 10/16/20 1036)  azithromycin (ZITHROMAX) tablet 500 mg (500 mg Oral Given 10/16/20 1037)     NEW OUTPATIENT MEDICATIONS STARTED DURING THIS VISIT:  New Prescriptions   AZITHROMYCIN (ZITHROMAX) 250 MG TABLET    Take 1 tablet (250 mg total) by mouth daily. Take first 2 tablets together, then 1 every day until finished.   BENZONATATE (TESSALON) 100 MG CAPSULE    Take 1 capsule (100 mg total) by mouth 3 (three) times daily as needed for cough.   PREDNISONE (DELTASONE) 20 MG TABLET    Take 2 tablets (40 mg total) by mouth daily for 4 days.    Note:  This document was prepared using Dragon voice recognition software and may include unintentional dictation errors.  Alona Bene, MD, [facility] Emergency Medicine    Brizeyda Holtmeyer, Arlyss Repress, MD 10/16/20 1149

## 2020-10-16 NOTE — Discharge Instructions (Signed)
You were seen in the emergency department today with cough and occasional shortness of breath.  I plan on treating you with steroid along with antibiotic and cough pills.  You do not have Covid or flu.  Please relay this finding to your PCP and schedule an in person follow-up visit.  I have listed an alternate primary care doctor should you need to switch to someone who will see you in person.

## 2020-10-28 ENCOUNTER — Encounter: Payer: Self-pay | Admitting: Internal Medicine

## 2020-10-28 ENCOUNTER — Other Ambulatory Visit: Payer: Self-pay

## 2020-10-28 ENCOUNTER — Ambulatory Visit: Payer: PRIVATE HEALTH INSURANCE | Admitting: Internal Medicine

## 2020-10-28 VITALS — BP 118/76 | HR 64 | Temp 98.3°F | Resp 16 | Ht 72.0 in | Wt 208.0 lb

## 2020-10-28 DIAGNOSIS — D520 Dietary folate deficiency anemia: Secondary | ICD-10-CM

## 2020-10-28 DIAGNOSIS — E519 Thiamine deficiency, unspecified: Secondary | ICD-10-CM | POA: Diagnosis not present

## 2020-10-28 DIAGNOSIS — J22 Unspecified acute lower respiratory infection: Secondary | ICD-10-CM | POA: Insufficient documentation

## 2020-10-28 DIAGNOSIS — I10 Essential (primary) hypertension: Secondary | ICD-10-CM

## 2020-10-28 DIAGNOSIS — E118 Type 2 diabetes mellitus with unspecified complications: Secondary | ICD-10-CM

## 2020-10-28 DIAGNOSIS — J4541 Moderate persistent asthma with (acute) exacerbation: Secondary | ICD-10-CM

## 2020-10-28 LAB — BASIC METABOLIC PANEL
BUN: 11 mg/dL (ref 6–23)
CO2: 29 mEq/L (ref 19–32)
Calcium: 9 mg/dL (ref 8.4–10.5)
Chloride: 104 mEq/L (ref 96–112)
Creatinine, Ser: 1.13 mg/dL (ref 0.40–1.50)
GFR: 70.03 mL/min (ref >60.00)
Glucose, Bld: 101 mg/dL — ABNORMAL HIGH (ref 70–99)
Potassium: 4.1 mEq/L (ref 3.5–5.1)
Sodium: 137 mEq/L (ref 135–145)

## 2020-10-28 LAB — CBC WITH DIFFERENTIAL/PLATELET
Basophils Absolute: 0 10*3/uL (ref 0.0–0.1)
Basophils Relative: 0.8 % (ref 0.0–3.0)
Eosinophils Absolute: 0.3 10*3/uL (ref 0.0–0.7)
Eosinophils Relative: 6.5 % — ABNORMAL HIGH (ref 0.0–5.0)
HCT: 35.4 % — ABNORMAL LOW (ref 39.0–52.0)
Hemoglobin: 11.5 g/dL — ABNORMAL LOW (ref 13.0–17.0)
Lymphocytes Relative: 48.3 % — ABNORMAL HIGH (ref 12.0–46.0)
Lymphs Abs: 2.2 10*3/uL (ref 0.7–4.0)
MCHC: 32.6 g/dL (ref 30.0–36.0)
MCV: 89.1 fl (ref 78.0–100.0)
Monocytes Absolute: 0.5 10*3/uL (ref 0.1–1.0)
Monocytes Relative: 10 % (ref 3.0–12.0)
Neutro Abs: 1.6 10*3/uL (ref 1.4–7.7)
Neutrophils Relative %: 34.4 % — ABNORMAL LOW (ref 43.0–77.0)
Platelets: 225 10*3/uL (ref 150.0–400.0)
RBC: 3.98 Mil/uL — ABNORMAL LOW (ref 4.22–5.81)
RDW: 16.3 % — ABNORMAL HIGH (ref 11.5–15.5)
WBC: 4.6 10*3/uL (ref 4.0–10.5)

## 2020-10-28 LAB — VITAMIN B12: Vitamin B-12: 608 pg/mL (ref 211–911)

## 2020-10-28 LAB — HEMOGLOBIN A1C: Hgb A1c MFr Bld: 6.2 % (ref 4.6–6.5)

## 2020-10-28 LAB — FOLATE: Folate: 6.2 ng/mL (ref >5.9)

## 2020-10-28 MED ORDER — CEFDINIR 300 MG PO CAPS: 300.0000 mg | ORAL_CAPSULE | Freq: Two times a day (BID) | ORAL | 0 refills | Status: AC

## 2020-10-28 MED ORDER — TRELEGY ELLIPTA 200-62.5-25 MCG/INH IN AEPB: 1.0000 | INHALATION_SPRAY | Freq: Every day | RESPIRATORY_TRACT | 1 refills | Status: DC

## 2020-10-28 MED ORDER — HYDROCODONE-HOMATROPINE 5-1.5 MG/5ML PO SYRP: 5.0000 mL | ORAL_SOLUTION | Freq: Three times a day (TID) | ORAL | 0 refills | Status: DC | PRN

## 2020-10-28 NOTE — Patient Instructions (Signed)
http://www.aaaai.org/conditions-and-treatments/asthma">  Asthma, Adult  Asthma is a long-term (chronic) condition that causes recurrent episodes in which the airways become tight and narrow. The airways are the passages that lead from the nose and mouth down into the lungs. Asthma episodes, also called asthma attacks, can cause coughing, wheezing, shortness of breath, and chest pain. The airways can also fill with mucus. During an attack, it can be difficult to breathe. Asthma attacks can range from minor to life threatening. Asthma cannot be cured, but medicines and lifestyle changes can help control it and treat acute attacks. What are the causes? This condition is believed to be caused by inherited (genetic) and environmental factors, but its exact cause is not known. There are many things that can bring on an asthma attack or make asthma symptoms worse (triggers). Asthma triggers are different for each person. Common triggers include:  Mold.  Dust.  Cigarette smoke.  Cockroaches.  Things that can cause allergy symptoms (allergens), such as animal dander or pollen from trees or grass.  Air pollutants such as household cleaners, wood smoke, smog, or chemical odors.  Cold air, weather changes, and winds (which increase molds and pollen in the air).  Strong emotional expressions such as crying or laughing hard.  Stress.  Certain medicines (such as aspirin) or types of medicines (such as beta-blockers).  Sulfites in foods and drinks. Foods and drinks that may contain sulfites include dried fruit, potato chips, and sparkling grape juice.  Infections or inflammatory conditions such as the flu, a cold, or inflammation of the nasal membranes (rhinitis).  Gastroesophageal reflux disease (GERD).  Exercise or strenuous activity. What are the signs or symptoms? Symptoms of this condition may occur right after asthma is triggered or many hours later. Symptoms include:  Wheezing. This can  sound like whistling when you breathe.  Excessive nighttime or early morning coughing.  Frequent or severe coughing with a common cold.  Chest tightness.  Shortness of breath.  Tiredness (fatigue) with minimal activity. How is this diagnosed? This condition is diagnosed based on:  Your medical history.  A physical exam.  Tests, which may include: ? Lung function studies and pulmonary studies (spirometry). These tests can evaluate the flow of air in your lungs. ? Allergy tests. ? Imaging tests, such as X-rays. How is this treated? There is no cure for this condition, but treatment can help control your symptoms. Treatment for asthma usually involves:  Identifying and avoiding your asthma triggers.  Using medicines to control your symptoms. Generally, two types of medicines are used to treat asthma: ? Controller medicines. These help prevent asthma symptoms from occurring. They are usually taken every day. ? Fast-acting reliever or rescue medicines. These quickly relieve asthma symptoms by widening the narrow and tight airways. They are used as needed and provide short-term relief.  Using supplemental oxygen. This may be needed during a severe episode.  Using other medicines, such as: ? Allergy medicines, such as antihistamines, if your asthma attacks are triggered by allergens. ? Immune medicines (immunomodulators). These are medicines that help control the immune system.  Creating an asthma action plan. An asthma action plan is a written plan for managing and treating your asthma attacks. This plan includes: ? A list of your asthma triggers and how to avoid them. ? Information about when medicines should be taken and when their dosage should be changed. ? Instructions about using a device called a peak flow meter. A peak flow meter measures how well the lungs are working   and the severity of your asthma. It helps you monitor your condition. Follow these instructions at  home: Controlling your home environment Control your home environment in the following ways to help avoid triggers and prevent asthma attacks:  Change your heating and air conditioning filter regularly.  Limit your use of fireplaces and wood stoves.  Get rid of pests (such as roaches and mice) and their droppings.  Throw away plants if you see mold on them.  Clean floors and dust surfaces regularly. Use unscented cleaning products.  Try to have someone else vacuum for you regularly. Stay out of rooms while they are being vacuumed and for a short while afterward. If you vacuum, use a dust mask from a hardware store, a double-layered or microfilter vacuum cleaner bag, or a vacuum cleaner with a HEPA filter.  Replace carpet with wood, tile, or vinyl flooring. Carpet can trap dander and dust.  Use allergy-proof pillows, mattress covers, and box spring covers.  Keep your bedroom a trigger-free room.  Avoid pets and keep windows closed when allergens are in the air.  Wash beddings every week in hot water and dry them in a dryer.  Use blankets that are made of polyester or cotton.  Clean bathrooms and kitchens with bleach. If possible, have someone repaint the walls in these rooms with mold-resistant paint. Stay out of the rooms that are being cleaned and painted.  Wash your hands often with soap and water. If soap and water are not available, use hand sanitizer.  Do not allow anyone to smoke in your home. General instructions  Take over-the-counter and prescription medicines only as told by your health care provider. ? Speak with your health care provider if you have questions about how or when to take the medicines. ? Make note if you are requiring more frequent dosages.  Do not use any products that contain nicotine or tobacco, such as cigarettes and e-cigarettes. If you need help quitting, ask your health care provider. Also, avoid being exposed to secondhand smoke.  Use a peak  flow meter as told by your health care provider. Record and keep track of the readings.  Understand and use the asthma action plan to help minimize, or stop an asthma attack, without needing to seek medical care.  Make sure you stay up to date on your yearly vaccinations as told by your health care provider. This may include vaccines for the flu and pneumonia.  Avoid outdoor activities when allergen counts are high and when air quality is low.  Wear a ski mask that covers your nose and mouth during outdoor winter activities. Exercise indoors on cold days if you can.  Warm up before exercising, and take time for a cool-down period after exercise.  Keep all follow-up visits as told by your health care provider. This is important. Where to find more information  For information about asthma, turn to the Centers for Disease Control and Prevention at www.cdc.gov/asthma/faqs.htm  For air quality information, turn to AirNow at https://airnow.gov/ Contact a health care provider if:  You have wheezing, shortness of breath, or a cough even while you are taking medicine to prevent attacks.  The mucus you cough up (sputum) is thicker than usual.  Your sputum changes from clear or white to yellow, green, gray, or bloody.  Your medicines are causing side effects, such as a rash, itching, swelling, or trouble breathing.  You need to use a reliever medicine more than 2-3 times a week.  Your peak   flow reading is still at 50-79% of your personal best after following your action plan for 1 hour.  You have a fever. Get help right away if:  You are getting worse and do not respond to treatment during an asthma attack.  You are short of breath when at rest or when doing very little physical activity.  You have difficulty eating, drinking, or talking.  You have chest pain or tightness.  You develop a fast heartbeat or palpitations.  You have a bluish color to your lips or fingernails.  You  are light-headed or dizzy, or you faint.  Your peak flow reading is less than 50% of your personal best.  You feel too tired to breathe normally. Summary  Asthma is a long-term (chronic) condition that causes recurrent episodes in which the airways become tight and narrow. These episodes can cause coughing, wheezing, shortness of breath, and chest pain.  Asthma cannot be cured, but medicines and lifestyle changes can help control it and treat acute attacks.  Make sure you understand how to avoid triggers and how and when to use your medicines.  Asthma attacks can range from minor to life threatening. Get help right away if you have an asthma attack and do not respond to treatment with your usual rescue medicines. This information is not intended to replace advice given to you by your health care provider. Make sure you discuss any questions you have with your health care provider. Document Revised: 02/09/2019 Document Reviewed: 01/11/2017 Elsevier Patient Education  2020 [employer]  

## 2020-10-28 NOTE — Progress Notes (Signed)
Subjective:  Patient ID: Bradley Watson, male    DOB: 10/04/1961  Age: 59 y.o. MRN: [id number]  CC: URI, Cough, Asthma, and Anemia  This visit occurred during the SARS-CoV-2 public health emergency.  Safety protocols were in place, including screening questions prior to the visit, additional usage of staff PPE, and extensive cleaning of exam room while observing appropriate contact time as indicated for disinfecting solutions.    HPI Bradley Watson presents for f/up - He complains of a 3020-month history of cough that is intermittently productive of thick yellow phlegm.  He also has wheezing and mild shortness of breath.  He denies chest pain, hemoptysis, fevers, or chills.  He was recently seen in the ED and his chest x-ray was normal.  He has been using a nebulized SABA and prednisone which has helped.  He is active and denies any recent episodes of diaphoresis, dizziness, or lightheadedness.  He has a history of anemia and is currently taking a multivitamin and an iron supplement.  He is not aware of any sources of blood loss.  He denies fatigue, paresthesias, lymphadenopathy, or rash.  Outpatient Medications Prior to Visit  Medication Sig Dispense Refill  . albuterol (VENTOLIN HFA) 108 (90 Base) MCG/ACT inhaler Inhale 2 puffs into the lungs every 6 (six) hours as needed for wheezing or shortness of breath. 8 g 2  . ASPIRIN CHILDRENS 81 MG chewable tablet Chew by mouth.    Marland Kitchen. azithromycin (ZITHROMAX) 250 MG tablet Take 1 tablet (250 mg total) by mouth daily. Take first 2 tablets together, then 1 every day until finished. 6 tablet 0  . benzonatate (TESSALON) 100 MG capsule Take 1 capsule (100 mg total) by mouth 3 (three) times daily as needed for cough. 21 capsule 0  . brimonidine (ALPHAGAN) 0.2 % ophthalmic solution Place 1 drop into both eyes 3 (three) times daily. (Patient taking differently: Place 1 drop into both eyes 2 (two) times daily. ) 10 mL 10  . carvedilol (COREG) 3.125 MG tablet  Take 1 tablet (3.125 mg total) by mouth 2 (two) times daily with a meal. 180 tablet 3  . hydrOXYzine (ATARAX/VISTARIL) 10 MG tablet Take 30-60 mg by mouth daily as needed for itching.     Marland Kitchen. omeprazole (PRILOSEC) 20 MG capsule Take 2 tabs daily 30 minutes before a meal for 2 weeks and then 1 tab daily 30 minutes before a meal 60 capsule 3  . rosuvastatin (CRESTOR) 20 MG tablet Take 1 tablet (20 mg total) by mouth daily. 90 tablet 3  . tacrolimus (PROTOPIC) 0.1 % ointment APP EXT TO THE SKIN BID    . triamcinolone ointment (KENALOG) 0.1 % APPLY ON THE SKIN TWICE A DAY AS NEEDED    . aspirin 81 MG EC tablet Take 1 tablet (81 mg total) by mouth daily. (Patient not taking: Reported on 10/28/2020) 90 tablet 1   No facility-administered medications prior to visit.    ROS Review of Systems  Constitutional: Negative for appetite change, chills, diaphoresis, fatigue and fever.  HENT: Negative.  Negative for sore throat and trouble swallowing.   Respiratory: Positive for cough, shortness of breath and wheezing.   Cardiovascular: Negative for chest pain, palpitations and leg swelling.  Gastrointestinal: Negative for abdominal pain, blood in stool, constipation, diarrhea, nausea and vomiting.  Endocrine: Negative for polydipsia, polyphagia and polyuria.  Genitourinary: Negative.  Negative for difficulty urinating and hematuria.  Musculoskeletal: Negative for arthralgias and myalgias.  Skin: Negative.  Negative for color  change, pallor and rash.  Neurological: Negative.  Negative for dizziness, weakness, light-headedness and numbness.  Hematological: Negative for adenopathy. Does not bruise/bleed easily.  Psychiatric/Behavioral: Negative.     Objective:  BP 118/76   Pulse 64   Temp 98.3 F (36.8 C) (Oral)   Resp 16   Ht 6' (1.829 m)   Wt 208 lb (94.3 kg)   SpO2 96%   BMI 28.21 kg/m   BP Readings from Last 3 Encounters:  10/28/20 118/76  10/16/20 (!) 128/92  08/27/20 120/60    Wt  Readings from Last 3 Encounters:  10/28/20 208 lb (94.3 kg)  10/16/20 206 lb 3.2 oz (93.5 kg)  08/27/20 193 lb (87.5 kg)    Physical Exam Vitals reviewed.  Constitutional:      General: He is not in acute distress.    Appearance: He is not ill-appearing, toxic-appearing or diaphoretic.  HENT:     Nose: Nose normal.     Mouth/Throat:     Mouth: Mucous membranes are moist.  Eyes:     General: No scleral icterus.    Conjunctiva/sclera: Conjunctivae normal.  Cardiovascular:     Rate and Rhythm: Normal rate and regular rhythm.     Heart sounds: No murmur heard.  No gallop.   Pulmonary:     Effort: No respiratory distress.     Breath sounds: Examination of the right-middle field reveals wheezing. Examination of the left-middle field reveals wheezing. Examination of the right-lower field reveals wheezing. Examination of the left-lower field reveals wheezing. Wheezing present. No decreased breath sounds, rhonchi or rales.  Abdominal:     General: Abdomen is flat.     Palpations: There is no mass.     Tenderness: There is no abdominal tenderness. There is no guarding.  Musculoskeletal:        General: Normal range of motion.     Cervical back: Neck supple.     Right lower leg: No edema.     Left lower leg: No edema.  Lymphadenopathy:     Cervical: No cervical adenopathy.  Skin:    General: Skin is warm and dry.     Coloration: Skin is not pale.  Neurological:     General: No focal deficit present.     Mental Status: He is alert and oriented to person, place, and time. Mental status is at baseline.  Psychiatric:        Mood and Affect: Mood normal.        Behavior: Behavior normal.     Lab Results  Component Value Date   WBC 4.6 10/28/2020   HGB 11.5 (L) 10/28/2020   HCT 35.4 (L) 10/28/2020   PLT 225.0 10/28/2020   GLUCOSE 101 (H) 10/28/2020   CHOL 208 (H) 08/27/2020   TRIG 89 08/27/2020   HDL 54 08/27/2020   LDLCALC 138 (H) 08/27/2020   ALT 19 08/13/2019   AST 33  08/13/2019   NA 137 10/28/2020   K 4.1 10/28/2020   CL 104 10/28/2020   CREATININE 1.13 10/28/2020   BUN 11 10/28/2020   CO2 29 10/28/2020   TSH 1.94 12/18/2019   PSA 1.1 12/18/2019   INR 1.47 10/31/2012   HGBA1C 6.2 10/28/2020   MICROALBUR <0.7 12/20/2019    DG Chest Portable 1 View  Result Date: 10/16/2020 CLINICAL DATA:  Cough EXAM: PORTABLE CHEST 1 VIEW COMPARISON:  November 22, 2019 chest radiograph; chest CT November 29, 2019 FINDINGS: Lungs are clear. Heart size and pulmonary vascularity are  normal. Patient is status post coronary artery bypass grafting. No adenopathy. No pneumothorax. No bone lesions. IMPRESSION: Lungs clear. Stable cardiac silhouette. Status post coronary artery bypass grafting. Electronically Signed   By: Bretta Bang III M.D.   On: 10/16/2020 10:47    Assessment & Plan:   Viyan was seen today for uri, cough, asthma and anemia.  Diagnoses and all orders for this visit:  Type 2 diabetes mellitus with complication, without long-term current use of insulin (HCC)- His A1c is at 6.2%.  His blood sugars are adequately well controlled. -     Basic metabolic panel; Future -     Hemoglobin A1c; Future -     Hemoglobin A1c -     Basic metabolic panel  Dietary folate deficiency anemia- His H&H have declined slightly but his folate level is normal.  He may have the anemia of chronic disease. -     CBC with Differential/Platelet; Future -     Vitamin B12; Future -     Folate; Future -     Folate -     Vitamin B12 -     CBC with Differential/Platelet  Thiamine deficiency- His thiamine level is normal now. -     CBC with Differential/Platelet; Future -     Vitamin B1; Future -     Vitamin B1 -     CBC with Differential/Platelet  Essential hypertension- His blood pressure is adequately well controlled.  Electrolytes and renal function are normal. -     Basic metabolic panel; Future -     Basic metabolic panel  Moderate persistent asthma with acute  exacerbation- I recommended that he start using a LAMA/LABA/ICS inhaler for this. -     Fluticasone-Umeclidin-Vilant (TRELEGY ELLIPTA) 200-62.5-25 MCG/INH AEPB; Inhale 1 puff into the lungs daily.  LRTI (lower respiratory tract infection)- Will treat with a broad-spectrum antibiotic to treat bacterial causes. -     cefdinir (OMNICEF) 300 MG capsule; Take 1 capsule (300 mg total) by mouth 2 (two) times daily for 10 days. -     HYDROcodone-homatropine (HYCODAN) 5-1.5 MG/5ML syrup; Take 5 mLs by mouth every 8 (eight) hours as needed for cough.   I am having Bradley Watson start on Trelegy Ellipta, cefdinir, and HYDROcodone-homatropine. I am also having him maintain his hydrOXYzine, triamcinolone ointment, tacrolimus, brimonidine, rosuvastatin, carvedilol, omeprazole, albuterol, benzonatate, azithromycin, and Aspirin Childrens.  Meds ordered this encounter  Medications  . Fluticasone-Umeclidin-Vilant (TRELEGY ELLIPTA) 200-62.5-25 MCG/INH AEPB    Sig: Inhale 1 puff into the lungs daily.    Dispense:  120 each    Refill:  1  . cefdinir (OMNICEF) 300 MG capsule    Sig: Take 1 capsule (300 mg total) by mouth 2 (two) times daily for 10 days.    Dispense:  20 capsule    Refill:  0  . HYDROcodone-homatropine (HYCODAN) 5-1.5 MG/5ML syrup    Sig: Take 5 mLs by mouth every 8 (eight) hours as needed for cough.    Dispense:  120 mL    Refill:  0   I spent 50 minutes in preparing to see the patient by review of recent labs, imaging and procedures, obtaining and reviewing separately obtained history, communicating with the patient and family or caregiver, ordering medications, tests or procedures, and documenting clinical information in the EHR including the differential Dx, treatment, and any further evaluation and other management of 1. Type 2 diabetes mellitus with complication, without long-term current use of insulin (HCC) 2. Dietary  folate deficiency anemia 3. Thiamine deficiency 4. Essential  hypertension 5. Moderate persistent asthma with acute exacerbation 6. LRTI (lower respiratory tract infection)     Follow-up: Return in about 6 weeks (around 12/09/2020).  Sanda Linger, MD

## 2020-11-01 LAB — VITAMIN B1: Vitamin B1 (Thiamine): 11 nmol/L (ref 8–30)

## 2020-11-25 ENCOUNTER — Ambulatory Visit: Payer: PRIVATE HEALTH INSURANCE | Attending: Internal Medicine

## 2020-11-25 DIAGNOSIS — Z23 Encounter for immunization: Secondary | ICD-10-CM

## 2020-11-25 NOTE — Progress Notes (Signed)
   Covid-19 Vaccination Clinic  Name:  Bradley Watson    MRN: [id number] DOB: 12-Jan-1961  11/25/2020  [name] was observed post Covid-19 immunization for 15 minutes without incident. He was provided with Vaccine Information Sheet and instruction to access the V-Safe system.   [name] was instructed to call 911 with any severe reactions post vaccine: Marland Kitchen Difficulty breathing  . Swelling of face and throat  . A fast heartbeat  . A bad rash all over body  . Dizziness and weakness   Immunizations Administered    Name Date Dose VIS Date Route   Pfizer COVID-19 Vaccine 11/25/2020  1:55 PM 0.3 mL 10/09/2020 Intramuscular   Manufacturer: [employer], [employer]   Lot: [id number]   NDC: [address]-0

## 2020-11-26 ENCOUNTER — Telehealth: Payer: PRIVATE HEALTH INSURANCE | Admitting: Family Medicine

## 2020-11-26 ENCOUNTER — Other Ambulatory Visit: Payer: Self-pay | Admitting: Internal Medicine

## 2020-11-26 ENCOUNTER — Encounter: Payer: Self-pay | Admitting: Family Medicine

## 2020-11-26 ENCOUNTER — Telehealth: Payer: Self-pay | Admitting: Internal Medicine

## 2020-11-26 DIAGNOSIS — J22 Unspecified acute lower respiratory infection: Secondary | ICD-10-CM

## 2020-11-26 NOTE — Telephone Encounter (Signed)
Patient calling to report he has began to cough again, requesting additional HYDROcodone-homatropine (HYCODAN) 5-1.5 MG/5ML syrup and Cefdinir  Patient scheduled for virtual appointment with Dr Selena Batten at 5:40 today

## 2020-11-26 NOTE — Progress Notes (Signed)
Pt on schedule for virtual visit. Sent link and tried to contact pt by phone at time of visit. LM for patient to contact office to reschedule or cancel per preference or log in for visit and waited in virtual space until 10 minutes after the appointment time. Sent message to admin to reach out to patient to reschedule or cancel per pt preference.

## 2020-12-02 ENCOUNTER — Telehealth (INDEPENDENT_AMBULATORY_CARE_PROVIDER_SITE_OTHER): Payer: PRIVATE HEALTH INSURANCE | Admitting: Family

## 2020-12-02 DIAGNOSIS — R059 Cough, unspecified: Secondary | ICD-10-CM

## 2020-12-04 NOTE — Progress Notes (Signed)
Patient had appointment scheduled at 8:40 am on Monday, December 13. I attempted to call him multiple times and he did not answer. I asked my front desk to mark him as a no-show.

## 2021-01-17 DIAGNOSIS — H524 Presbyopia: Secondary | ICD-10-CM | POA: Diagnosis not present

## 2021-01-17 DIAGNOSIS — H401132 Primary open-angle glaucoma, bilateral, moderate stage: Secondary | ICD-10-CM | POA: Diagnosis not present

## 2021-01-17 DIAGNOSIS — Z961 Presence of intraocular lens: Secondary | ICD-10-CM | POA: Diagnosis not present

## 2021-02-14 ENCOUNTER — Other Ambulatory Visit: Payer: Self-pay | Admitting: Adult Health

## 2021-02-27 NOTE — Progress Notes (Signed)
Cardiology Office Note   Date:  02/28/2021   ID:  Bradley Watson, DOB 08/02/1961, MRN [id number]  PCP:  [name], MD  Cardiologist:   Rollene Rotunda, MD Referring:  [name], MD  Chief Complaint  Patient presents with  . Coronary Artery Disease  . Cough      History of Present Illness: [name] is a 60 y.o. male who is referred by [name], MD for evaluation of a reduced EF.  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  He had LIMA to the LAD, left radial to the obtuse marginal. Echocardiogram November 2013 showed normal LV function, grade 1 diastolic dysfunction and mild left atrial enlargement. Nuclear study in June of 2015 showed an ejection fraction of 52%. Apical thinning but no ischemia. He had a repeat Lexiscan Myoview ordered by [name], MD in Jan of last year.  This demonstrated a small apical reversible defect with an EF that was slightly lower than previous.  Echocardiogram dated 02/02/2020 revealed reduced EF of 45% to 50%, the left ventricle did have some mildly decreased function.  He was found to have grade 1 diastolic dysfunction.  RV systolic function was also moderately reduced.  Since I last saw him he has done well from a cardiac standpoint.  He has had a recent bronchitis.  He still has a little bit of that and is requesting renewal of his antibiotic and antitussive.  He has had no cardiovascular symptoms. The patient denies any new symptoms such as chest discomfort, neck or arm discomfort. There has been no new shortness of breath, PND or orthopnea. There have been no reported palpitations, presyncope or syncope.   Past Medical History:  Diagnosis Date  . Carotid stenosis    Pre-CABG Dopplers: RICA 40-59%;  Carotid US (05/2014):  R 1-39%; normal LICA  . Cataract    OU  . Childhood asthma   . Cluster headaches 1985   Mid 80's  . Coronary artery disease    a.  LHC 10/27/12 demonstrated pLAD 70%, AV CFX occluded, pOM 80%, dCFX  collateralized the LAD and RCA, RCA without obstructive lesions, EF 50% with inferobasal HK;  b.  Echo 10/30/12: EF 55-60%, grade 1 diastolic dysfunction, mild LAE, normal LV wall motion;  c. s/p CABG 10/31/12 with Dr. Dorris Fetch:  L-LAD, left radial-OM;   b.  ETT-Nuc (05/2014):  + ECG changes, no ischemia; low risk  . Diverticulitis 02-2012   CT scan   . Fatty liver 02-2012   Mild- CT scan   . GERD (gastroesophageal reflux disease)   . HLD (hyperlipidemia)   . Iron deficiency anemia 1980's  . Type II diabetes mellitus with manifestations [facility])     Past Surgical History:  Procedure Laterality Date  . CARDIAC CATHETERIZATION  10/27/2012  . cataract  2020  . CORONARY ARTERY BYPASS GRAFT  10/31/2012   Procedure: CORONARY ARTERY BYPASS GRAFTING (CABG);  Surgeon: Loreli Slot, MD;  Location: [facility] OR;  Service: Open Heart Surgery;  Laterality: N/A;  . LEFT HEART CATHETERIZATION WITH CORONARY ANGIOGRAM N/A 10/27/2012   Procedure: LEFT HEART CATHETERIZATION WITH CORONARY ANGIOGRAM;  Surgeon: Herby Abraham, MD;  Location: [facility] CATH LAB;  Service: Cardiovascular;  Laterality: N/A;  . RADIAL ARTERY HARVEST  10/31/2012   Procedure: RADIAL ARTERY HARVEST;  Surgeon: Loreli Slot, MD;  Location: [facility] OR;  Service: Open Heart Surgery;  Laterality: Left;  . RADIAL KERATOTOMY  1985   bilaterally  Current Outpatient Medications  Medication Sig Dispense Refill  . cefdinir (OMNICEF) 300 MG capsule Take 1 capsule (300 mg total) by mouth 2 (two) times daily. 6 capsule 0  . losartan (COZAAR) 25 MG tablet Take 1 tablet (25 mg total) by mouth daily. 30 tablet 12  . albuterol (VENTOLIN HFA) 108 (90 Base) MCG/ACT inhaler Inhale 2 puffs into the lungs every 6 (six) hours as needed for wheezing or shortness of breath. 8 g 2  . ASPIRIN CHILDRENS 81 MG chewable tablet Chew by mouth.    Marland Kitchen azithromycin (ZITHROMAX) 250 MG tablet Take 1 tablet (250 mg total) by mouth daily. Take first 2 tablets together,  then 1 every day until finished. 6 tablet 0  . benzonatate (TESSALON) 100 MG capsule Take 1 capsule (100 mg total) by mouth 3 (three) times daily as needed for cough. 21 capsule 0  . brimonidine (ALPHAGAN) 0.2 % ophthalmic solution Place 1 drop into both eyes 3 (three) times daily. (Patient taking differently: Place 1 drop into both eyes 2 (two) times daily. ) 10 mL 10  . carvedilol (COREG) 3.125 MG tablet Take 1 tablet (3.125 mg total) by mouth 2 (two) times daily with a meal. NEED OV. 180 tablet 0  . Fluticasone-Umeclidin-Vilant (TRELEGY ELLIPTA) 200-62.5-25 MCG/INH AEPB Inhale 1 puff into the lungs daily. 120 each 1  . HYDROcodone-homatropine (HYCODAN) 5-1.5 MG/5ML syrup Take 5 mLs by mouth every 8 (eight) hours as needed for cough. 120 mL 0  . hydrOXYzine (ATARAX/VISTARIL) 10 MG tablet Take 30-60 mg by mouth daily as needed for itching.     Marland Kitchen omeprazole (PRILOSEC) 20 MG capsule Take 2 tabs daily 30 minutes before a meal for 2 weeks and then 1 tab daily 30 minutes before a meal 60 capsule 3  . rosuvastatin (CRESTOR) 40 MG tablet Take 1 tablet (40 mg total) by mouth daily. 60 tablet 12  . tacrolimus (PROTOPIC) 0.1 % ointment APP EXT TO THE SKIN BID    . triamcinolone ointment (KENALOG) 0.1 % APPLY ON THE SKIN TWICE A DAY AS NEEDED     No current facility-administered medications for this visit.    Allergies:   Penicillins and Lisinopril    ROS:  Please see the history of present illness.   Otherwise, review of systems are positive for none.   All other systems are reviewed and negative.    PHYSICAL EXAM: VS:  BP 128/90   Pulse (!) 56   Ht 6' (1.829 m)   Wt 215 lb 9.6 oz (97.8 kg)   BMI 29.24 kg/m  , BMI Body mass index is 29.24 kg/m. GENERAL:  Well appearing NECK:  No jugular venous distention, waveform within normal limits, carotid upstroke brisk and symmetric, no bruits, no thyromegaly LUNGS:  Clear to auscultation bilaterally CHEST:  Well healed sternotomy scar. HEART:  PMI not  displaced or sustained,S1 and S2 within normal limits, no S3, no S4, no clicks, no rubs, no murmurs ABD:  Flat, positive bowel sounds normal in frequency in pitch, no bruits, no rebound, no guarding, no midline pulsatile mass, no hepatomegaly, no splenomegaly EXT:  2 plus pulses throughout, no edema, no cyanosis no clubbing    EKG:  EKG is  ordered today. The ekg ordered today demonstrates sinus rhythm, rate 56, axis within normal limits, intervals within limits, no acute ST-T wave changes.   Recent Labs: 10/28/2020: BUN 11; Creatinine, Ser 1.13; Hemoglobin 11.5; Platelets 225.0; Potassium 4.1; Sodium 137    Lipid Panel  Component Value Date/Time   CHOL 208 (H) 08/27/2020 1104   TRIG 89 08/27/2020 1104   HDL 54 08/27/2020 1104   CHOLHDL 3.9 08/27/2020 1104   CHOLHDL 5 02/27/2020 0855   VLDL 17.8 02/27/2020 0855   LDLCALC 138 (H) 08/27/2020 1104      Wt Readings from Last 3 Encounters:  02/28/21 215 lb 9.6 oz (97.8 kg)  10/28/20 208 lb (94.3 kg)  10/16/20 206 lb 3.2 oz (93.5 kg)      Other studies Reviewed: Additional studies/ records that were reviewed today include: Labs Review of the above records demonstrates:  Please see elsewhere in the note.     ASSESSMENT AND PLAN:  CAD:  The patient has no new sypmtoms.  No further cardiovascular testing is indicated.  We will continue with aggressive risk reduction and meds as listed.  ISCHEMIC CARDIOMYOPATHY:    EF was he was on lisinopril previously.  He really presented recently he does not recall has not tolerating solid meds.  He does agree to trying Cozaar 25 mg daily.    HTN:   The blood pressure is at target.  Change in therapy as above.   DM (borderline): A1c was 6.2 and he has not required treatment.   DYSLIPIDEMIA: LDL most recently was 138 down from 236.  I will increase his Crestor to 40 mg daily and get a lipid profile he had 10 weeks.   CAROTID STENOSIS: He had very mild plaque in 2017.  No further imaging.       Current medicines are reviewed at length with the patient today.  The patient does not have concerns regarding medicines.  The following changes have been made:  As above  Labs/ tests ordered today include:   Orders Placed This Encounter  Procedures  . Lipid Profile  . EKG 12-Lead     Disposition:   FU with 12 months.      Signed, Rollene Rotunda, MD  02/28/2021 8:19 AM    [facility]

## 2021-02-28 ENCOUNTER — Encounter: Payer: Self-pay | Admitting: Cardiology

## 2021-02-28 ENCOUNTER — Other Ambulatory Visit: Payer: Self-pay

## 2021-02-28 ENCOUNTER — Ambulatory Visit: Payer: BLUE CROSS/BLUE SHIELD | Admitting: Cardiology

## 2021-02-28 ENCOUNTER — Telehealth: Payer: Self-pay | Admitting: Cardiology

## 2021-02-28 VITALS — BP 128/90 | HR 56 | Ht 72.0 in | Wt 215.6 lb

## 2021-02-28 DIAGNOSIS — J22 Unspecified acute lower respiratory infection: Secondary | ICD-10-CM | POA: Diagnosis not present

## 2021-02-28 DIAGNOSIS — I2510 Atherosclerotic heart disease of native coronary artery without angina pectoris: Secondary | ICD-10-CM | POA: Diagnosis not present

## 2021-02-28 DIAGNOSIS — Z79899 Other long term (current) drug therapy: Secondary | ICD-10-CM | POA: Diagnosis not present

## 2021-02-28 DIAGNOSIS — E785 Hyperlipidemia, unspecified: Secondary | ICD-10-CM

## 2021-02-28 MED ORDER — CEFDINIR 300 MG PO CAPS: 300.0000 mg | ORAL_CAPSULE | Freq: Two times a day (BID) | ORAL | 0 refills | Status: DC

## 2021-02-28 MED ORDER — LOSARTAN POTASSIUM 25 MG PO TABS: 25.0000 mg | ORAL_TABLET | Freq: Every day | ORAL | 12 refills | Status: DC

## 2021-02-28 MED ORDER — ROSUVASTATIN CALCIUM 40 MG PO TABS: 40.0000 mg | ORAL_TABLET | Freq: Every day | ORAL | 12 refills | Status: DC

## 2021-02-28 NOTE — Patient Instructions (Signed)
Medication Instructions:  INCREASE ROSUVASTATIN 40MG  DAILY  START LOSARTAN 25MG  DAILY *If you need a refill on your cardiac medications before your next appointment, please call your pharmacy*  Lab Work:    FASTING IN 10 WEEKS(05-09-2021)    Follow-Up: Your next appointment:  12 month(s) In Person with You may see , MD or one of the following Advanced Practice Providers on your designated Care Team:  05-11-2021, PA-C  Rollene Rotunda, DNP, ANP  Please call our office 2 months in advance to schedule this appointment   At [facility], you and your health needs are our priority.  As part of our continuing mission to provide you with exceptional heart care, we have created designated Provider Care Teams.  These Care Teams include your primary Cardiologist (physician) and Advanced Practice Providers (APPs -  Physician Assistants and Nurse Practitioners) who all work together to provide you with the care you need, when you need it.

## 2021-02-28 NOTE — Telephone Encounter (Signed)
Pt c/o medication issue:  1. Name of Medication: HYDROcodone-homatropine (HYCODAN) 5-1.5 MG/5ML syrup [[id number]]     3. Are you having a reaction (difficulty breathing--STAT)?NA  4. What is your medication issue? Pharmacy called in and stated this is a C2 Med and can not be phoned in.     Best number [phone]

## 2021-02-28 NOTE — Telephone Encounter (Signed)
This was last filled by Dr. Yetta Barre, patient's PCP. Will forward to patient's PCP.

## 2021-04-04 ENCOUNTER — Other Ambulatory Visit: Payer: Self-pay

## 2021-04-04 ENCOUNTER — Encounter (HOSPITAL_BASED_OUTPATIENT_CLINIC_OR_DEPARTMENT_OTHER): Payer: Self-pay

## 2021-04-04 ENCOUNTER — Emergency Department (HOSPITAL_BASED_OUTPATIENT_CLINIC_OR_DEPARTMENT_OTHER): Payer: BLUE CROSS/BLUE SHIELD

## 2021-04-04 ENCOUNTER — Emergency Department (HOSPITAL_BASED_OUTPATIENT_CLINIC_OR_DEPARTMENT_OTHER)
Admission: EM | Admit: 2021-04-04 | Discharge: 2021-04-04 | Disposition: A | Discharge status: Home / Self Care | Payer: BLUE CROSS/BLUE SHIELD | Attending: Emergency Medicine | Admitting: Emergency Medicine

## 2021-04-04 DIAGNOSIS — Z7982 Long term (current) use of aspirin: Secondary | ICD-10-CM | POA: Insufficient documentation

## 2021-04-04 DIAGNOSIS — Z951 Presence of aortocoronary bypass graft: Secondary | ICD-10-CM | POA: Insufficient documentation

## 2021-04-04 DIAGNOSIS — R0602 Shortness of breath: Secondary | ICD-10-CM | POA: Insufficient documentation

## 2021-04-04 DIAGNOSIS — R062 Wheezing: Secondary | ICD-10-CM | POA: Diagnosis not present

## 2021-04-04 DIAGNOSIS — E119 Type 2 diabetes mellitus without complications: Secondary | ICD-10-CM | POA: Insufficient documentation

## 2021-04-04 DIAGNOSIS — R5383 Other fatigue: Secondary | ICD-10-CM | POA: Diagnosis not present

## 2021-04-04 DIAGNOSIS — Z79899 Other long term (current) drug therapy: Secondary | ICD-10-CM | POA: Diagnosis not present

## 2021-04-04 DIAGNOSIS — Z7951 Long term (current) use of inhaled steroids: Secondary | ICD-10-CM | POA: Insufficient documentation

## 2021-04-04 DIAGNOSIS — I10 Essential (primary) hypertension: Secondary | ICD-10-CM | POA: Insufficient documentation

## 2021-04-04 DIAGNOSIS — I2510 Atherosclerotic heart disease of native coronary artery without angina pectoris: Secondary | ICD-10-CM | POA: Insufficient documentation

## 2021-04-04 DIAGNOSIS — Z87891 Personal history of nicotine dependence: Secondary | ICD-10-CM | POA: Diagnosis not present

## 2021-04-04 DIAGNOSIS — R531 Weakness: Secondary | ICD-10-CM | POA: Insufficient documentation

## 2021-04-04 DIAGNOSIS — R059 Cough, unspecified: Secondary | ICD-10-CM | POA: Diagnosis not present

## 2021-04-04 LAB — CBC WITH DIFFERENTIAL/PLATELET
Abs Immature Granulocytes: 0.01 K/uL (ref 0.00–0.07)
Basophils Absolute: 0.1 K/uL (ref 0.0–0.1)
Basophils Relative: 1 %
Eosinophils Absolute: 0.5 K/uL (ref 0.0–0.5)
Eosinophils Relative: 8 %
HCT: 42.7 % (ref 39.0–52.0)
Hemoglobin: 13.9 g/dL (ref 13.0–17.0)
Immature Granulocytes: 0 %
Lymphocytes Relative: 43 %
Lymphs Abs: 2.3 K/uL (ref 0.7–4.0)
MCH: 29.9 pg (ref 26.0–34.0)
MCHC: 32.6 g/dL (ref 30.0–36.0)
MCV: 91.8 fL (ref 80.0–100.0)
Monocytes Absolute: 0.5 K/uL (ref 0.1–1.0)
Monocytes Relative: 9 %
Neutro Abs: 2.2 K/uL (ref 1.7–7.7)
Neutrophils Relative %: 39 %
Platelets: 206 K/uL (ref 150–400)
RBC: 4.65 MIL/uL (ref 4.22–5.81)
RDW: 14.5 % (ref 11.5–15.5)
WBC: 5.5 K/uL (ref 4.0–10.5)
nRBC: 0 % (ref 0.0–0.2)

## 2021-04-04 LAB — BRAIN NATRIURETIC PEPTIDE: B Natriuretic Peptide: 37.5 pg/mL (ref 0.0–100.0)

## 2021-04-04 LAB — COMPREHENSIVE METABOLIC PANEL WITH GFR
ALT: 14 U/L (ref 0–44)
AST: 19 U/L (ref 15–41)
Albumin: 3.8 g/dL (ref 3.5–5.0)
Alkaline Phosphatase: 51 U/L (ref 38–126)
Anion gap: 6 (ref 5–15)
BUN: 16 mg/dL (ref 8–23)
CO2: 24 mmol/L (ref 22–32)
Calcium: 9.2 mg/dL (ref 8.9–10.3)
Chloride: 107 mmol/L (ref 98–111)
Creatinine, Ser: 1.1 mg/dL (ref 0.61–1.24)
GFR, Estimated: >60 mL/min
Glucose, Bld: 114 mg/dL — ABNORMAL HIGH (ref 70–99)
Potassium: 4.2 mmol/L (ref 3.5–5.1)
Sodium: 137 mmol/L (ref 135–145)
Total Bilirubin: 0.4 mg/dL (ref 0.3–1.2)
Total Protein: 7.1 g/dL (ref 6.5–8.1)

## 2021-04-04 LAB — TROPONIN I (HIGH SENSITIVITY): Troponin I (High Sensitivity): 8 ng/L (ref <18)

## 2021-04-04 LAB — TSH: TSH: 0.972 u[IU]/mL (ref 0.350–4.500)

## 2021-04-04 LAB — PROTIME-INR
INR: 1.1 (ref 0.8–1.2)
Prothrombin Time: 13.8 seconds (ref 11.4–15.2)

## 2021-04-04 MED ORDER — ALBUTEROL SULFATE HFA 108 (90 BASE) MCG/ACT IN AERS
2.0000 | INHALATION_SPRAY | RESPIRATORY_TRACT | Status: DC | PRN
  Administered 2021-04-04 (×2): 2 via RESPIRATORY_TRACT
  Filled 2021-04-04: qty 6.7

## 2021-04-04 NOTE — ED Notes (Signed)
Ambulated from lobby to room 6, SpO2 98-100%, HR 76-82, endorses mild DOE, BBS inspiratory & expiratory wheezes t/o.

## 2021-04-04 NOTE — Discharge Instructions (Signed)
1.  Your lab evaluation and your x-rays done in the emergency department today look normal.  Your chest x-ray does not show signs of fluid buildup or pneumonia.  You do have wheezing on your physical exam.  Continue to use your albuterol inhaler 2 puffs every 4-6 hours for wheezing.  Your oxygen level was normal. 2.  You describe significant amount of fatigue.  There is no obvious cause at this time.  Sometimes people experience fatigue as a side effect of certain medications.  Sometimes people with heart disease have decreased heart function and experience a lot of fatigue.  It is very important that you follow-up with your doctor to continue to review these possibilities. 3.  Return to emergency department immediately if you have changes such as chest pain, fevers, passing out episodes or other concerning symptoms.

## 2021-04-04 NOTE — ED Provider Notes (Signed)
[facility] EMERGENCY DEPARTMENT Provider Note   CSN: [id number] Arrival date & time: 04/04/21  1059     History Chief Complaint  Patient presents with  . Cough  . Weakness    Bradley Watson is a 60 y.o. male.  HPI Patient reports for a week he has felt extremely fatigued.  No generalized body aches or fevers.  He reports has had a cough.  Occasionally is productive of a small amount of mucus.  No chest pain.  Sometimes short of breath but not severe.  However, patient reports he has had wheezing now since being diagnosed with bronchitis in the emergency department.  The wheezing has improved since being started on albuterol inhaler.  No vomiting or diarrhea.  No syncopal episodes.  No lower extremity swelling or calf pain.  Patient reports he has had loss of smell and taste for over a year.  He reports he has had 4 COVID vaccines.  Denies he was ever diagnosed with COVID.  Patient does have coronary artery disease but has not formally been diagnosed with congestive heart failure.  He reports his doctor is mention to him the possibility of decreased heart function.    Past Medical History:  Diagnosis Date  . Carotid stenosis    Pre-CABG Dopplers: RICA 40-59%;  Carotid US (05/2014):  R 1-39%; normal LICA  . Cataract    OU  . Childhood asthma   . Cluster headaches 1985   Mid 80's  . Coronary artery disease    a.  LHC 10/27/12 demonstrated pLAD 70%, AV CFX occluded, pOM 80%, dCFX collateralized the LAD and RCA, RCA without obstructive lesions, EF 50% with inferobasal HK;  b.  Echo 10/30/12: EF 55-60%, grade 1 diastolic dysfunction, mild LAE, normal LV wall motion;  c. s/p CABG 10/31/12 with Dr. Dorris FetchHendrickson:  L-LAD, left radial-OM;   b.  ETT-Nuc (05/2014):  + ECG changes, no ischemia; low risk  . Diverticulitis 02-2012   CT scan   . Fatty liver 02-2012   Mild- CT scan   . GERD (gastroesophageal reflux disease)   . HLD (hyperlipidemia)   . Iron deficiency anemia 1980's  .  Type II diabetes mellitus with manifestations [facility](HCC)     Patient Active Problem List   Diagnosis Date Noted  . LRTI (lower respiratory tract infection) 10/28/2020  . Ischemic cardiomyopathy 03/06/2020  . Type 2 diabetes mellitus with complication, without long-term current use of insulin (HCC) 03/06/2020  . Colon cancer screening 02/27/2020  . Excessive daytime sleepiness 02/27/2020  . Cardiac LV ejection fraction of 40-49% 01/17/2020  . Thiamine deficiency 12/24/2019  . Dietary folate deficiency anemia 12/19/2019  . Vitamin D deficiency disease 12/19/2019  . Occult blood in stools 12/18/2019  . Moderate persistent asthma with acute exacerbation 12/18/2019  . Allergic rhinitis 11/03/2019  . Type II diabetes mellitus with manifestations (HCC) 09/29/2016  . Routine general medical examination at a health care facility 09/29/2016  . Eczema, allergic 09/29/2016  . Essential hypertension 05/14/2016  . Coronary atherosclerosis of native coronary artery 11/21/2012  . Hyperlipidemia LDL goal <70 10/28/2012  . GERD (gastroesophageal reflux disease) 10/24/2012    Past Surgical History:  Procedure Laterality Date  . CARDIAC CATHETERIZATION  10/27/2012  . cataract  2020  . CORONARY ARTERY BYPASS GRAFT  10/31/2012   Procedure: CORONARY ARTERY BYPASS GRAFTING (CABG);  Surgeon: Loreli SlotSteven C Hendrickson, MD;  [facility];  Service: Open Heart Surgery;  Laterality: N/A;  . LEFT HEART CATHETERIZATION WITH CORONARY  ANGIOGRAM N/A 10/27/2012   Procedure: LEFT HEART CATHETERIZATION WITH CORONARY ANGIOGRAM;  Surgeon: Herby Abraham, MD;  Location: [facility] CATH LAB;  Service: Cardiovascular;  Laterality: N/A;  . RADIAL ARTERY HARVEST  10/31/2012   Procedure: RADIAL ARTERY HARVEST;  Surgeon: Loreli Slot, MD;  Location: [facility] OR;  Service: Open Heart Surgery;  Laterality: Left;  . RADIAL KERATOTOMY  1985   bilaterally       Family History  Problem Relation Age of Onset  . Other Mother        car  accident   . Lung cancer Father   . Diabetes Mellitus II Paternal Grandmother     Social History   Tobacco Use  . Smoking status: Former Smoker    Packs/day: 0.12    Years: 1.50    Pack years: 0.18  . Smokeless tobacco: Never Used  Vaping Use  . Vaping Use: Never used  Substance Use Topics  . Alcohol use: Yes    Comment: occ  . Drug use: No    Types: "Crack" cocaine    Home Medications Prior to Admission medications   Medication Sig Start Date End Date Taking? Authorizing Provider  albuterol (VENTOLIN HFA) 108 (90 Base) MCG/ACT inhaler Inhale 2 puffs into the lungs every 6 (six) hours as needed for wheezing or shortness of breath. 10/08/20   Pincus Sanes, MD  ASPIRIN CHILDRENS 81 MG chewable tablet Chew by mouth. 07/22/20   [provider]  azithromycin (ZITHROMAX) 250 MG tablet Take 1 tablet (250 mg total) by mouth daily. Take first 2 tablets together, then 1 every day until finished. 10/16/20   Long, Arlyss Repress, MD  benzonatate (TESSALON) 100 MG capsule Take 1 capsule (100 mg total) by mouth 3 (three) times daily as needed for cough. 10/16/20   Long, Arlyss Repress, MD  brimonidine (ALPHAGAN) 0.2 % ophthalmic solution Place 1 drop into both eyes 3 (three) times daily. Patient taking differently: Place 1 drop into both eyes 2 (two) times daily.  07/28/19   Rennis Chris, MD  carvedilol (COREG) 3.125 MG tablet Take 1 tablet (3.125 mg total) by mouth 2 (two) times daily with a meal. NEED OV. 02/17/21   Jodelle Gross, NP  cefdinir (OMNICEF) 300 MG capsule Take 1 capsule (300 mg total) by mouth 2 (two) times daily. 02/28/21   Rollene Rotunda, MD  Fluticasone-Umeclidin-Vilant (TRELEGY ELLIPTA) 200-62.5-25 MCG/INH AEPB Inhale 1 puff into the lungs daily. 10/28/20   [name], MD  HYDROcodone-homatropine [facility]) 5-1.5 MG/5ML syrup Take 5 mLs by mouth every 8 (eight) hours as needed for cough. 10/28/20   [name], MD  hydrOXYzine (ATARAX/VISTARIL) 10 MG tablet Take 30-60  mg by mouth daily as needed for itching.  01/04/19   [provider]  losartan (COZAAR) 25 MG tablet Take 1 tablet (25 mg total) by mouth daily. 02/28/21 05/29/21  Rollene Rotunda, MD  omeprazole (PRILOSEC) 20 MG capsule Take 2 tabs daily 30 minutes before a meal for 2 weeks and then 1 tab daily 30 minutes before a meal 10/08/20   Burns, Bobette Mo, MD  rosuvastatin (CRESTOR) 40 MG tablet Take 1 tablet (40 mg total) by mouth daily. 02/28/21   Rollene Rotunda, MD  tacrolimus (PROTOPIC) 0.1 % ointment APP EXT TO THE SKIN BID 06/21/19   [provider]  triamcinolone ointment (KENALOG) 0.1 % APPLY ON THE SKIN TWICE A DAY AS NEEDED 05/04/19   [provider]    Allergies  Penicillins and Lisinopril  Review of Systems   Review of Systems 10 systems reviewed and negative except as per HPI Physical Exam Updated Vital Signs BP (!) 154/81   Pulse (!) 42   Temp 98.1 F (36.7 C) (Oral)   Resp 16   Ht 6' (1.829 m)   Wt 95.3 kg   SpO2 94%   BMI 28.48 kg/m   Physical Exam Constitutional:      Appearance: He is well-developed.     Comments: Alert and nontoxic.  No respiratory distress.  Clinically well appearance  HENT:     Head: Normocephalic and atraumatic.     Mouth/Throat:     Pharynx: Oropharynx is clear.  Eyes:     Extraocular Movements: Extraocular movements intact.     Conjunctiva/sclera: Conjunctivae normal.     Pupils: Pupils are equal, round, and reactive to light.  Cardiovascular:     Rate and Rhythm: Normal rate and regular rhythm.     Heart sounds: Normal heart sounds.  Pulmonary:     Effort: Pulmonary effort is normal.     Breath sounds: Wheezing present.     Comments: No respiratory distress.  Patient does have expiratory wheezing bilateral lung bases. Abdominal:     General: Bowel sounds are normal. There is no distension.     Palpations: Abdomen is soft.     Tenderness: There is no abdominal tenderness.  Musculoskeletal:        General: No  swelling or tenderness. Normal range of motion.     Cervical back: Neck supple.     Right lower leg: No edema.     Left lower leg: No edema.  Skin:    General: Skin is warm and dry.  Neurological:     General: No focal deficit present.     Mental Status: He is alert and oriented to person, place, and time.     GCS: GCS eye subscore is 4. GCS verbal subscore is 5. GCS motor subscore is 6.     Motor: No weakness.     Coordination: Coordination normal.  Psychiatric:        Mood and Affect: Mood normal.     ED Results / Procedures / Treatments   Labs (all labs ordered are listed, but only abnormal results are displayed) Labs Reviewed  COMPREHENSIVE METABOLIC PANEL - Abnormal; Notable for the following components:      Result Value   Glucose, Bld 114 (*)    All other components within normal limits  BRAIN NATRIURETIC PEPTIDE  CBC WITH DIFFERENTIAL/PLATELET  PROTIME-INR  URINALYSIS, ROUTINE W REFLEX MICROSCOPIC  TSH  TROPONIN I (HIGH SENSITIVITY)  TROPONIN I (HIGH SENSITIVITY)    EKG EKG Interpretation  Date/Time:  Friday April 04 2021 11:17:54 EDT Ventricular Rate:  52 PR Interval:  205 QRS Duration: 106 QT Interval:  442 QTC Calculation: 411 R Axis:   34 Text Interpretation: Sinus rhythm normal, no change from previous Confirmed by Arby Barrette [phone]) on 04/04/2021 12:57:40 PM   Radiology DG Chest Portable 1 View  Result Date: 04/04/2021 CLINICAL DATA:  Shortness of breath, cough and weakness for 1 week EXAM: PORTABLE CHEST 1 VIEW COMPARISON:  Portable exam 1127 hours compared to 10/16/2020 FINDINGS: Normal heart size post CABG. Mediastinal contours and pulmonary vascularity normal. Lungs clear. No pulmonary infiltrate, pleural effusion, or pneumothorax. Osseous structures unremarkable. IMPRESSION: No acute abnormalities. Electronically Signed   By: Ulyses Southward M.D.   On: 04/04/2021 12:09    Procedures Procedures  Medications Ordered in ED Medications   albuterol (VENTOLIN HFA) 108 (90 Base) MCG/ACT inhaler 2 puff (2 puffs Inhalation Given 04/04/21 1344)    ED Course  I have reviewed the triage vital signs and the nursing notes.  Pertinent labs & imaging results that were available during my care of the patient were reviewed by me and considered in my medical decision making (see chart for details).    MDM Rules/Calculators/A&P                         Patient presents as outlined above with generalized symptoms of fatigue, intermittent shortness of breath and decreased energy level.  He does not have focus of pain.  Clinically, patient is well in appearance.  Chest x-ray is clear.  No elevation in BNP.  No troponin elevation.  Patient reports having had an echo about 6 weeks ago without any significant changes.  No signs of immediate infectious etiology.  Patient is clinically well in appearance.  He does have wheezing on exam.  He reports he has had wheezing now apparently for several months.  He does not have any respiratory distress associated with this.  Patient does have history of early asthma but reports he has not been asthmatic his entire life.  He may need further evaluation for clarification of status for asthma or COPD.  Patient is non-smoker now.  He is counseled to continue using his albuterol inhaler every 4 hours for the next several days.  At this time, patient stable for discharge.  I recommend close follow-up with his PCP and cardiologist for continued monitoring of nonspecific fatigue. Final Clinical Impression(s) / ED Diagnoses Final diagnoses:  Fatigue, unspecified type  Wheeze    Rx / DC Orders ED Discharge Orders    None       Arby Barrette, MD 04/04/21 1506

## 2021-04-04 NOTE — ED Notes (Signed)
Troponin redrawn at 2pm.  Repeat due at 1600.

## 2021-04-04 NOTE — ED Triage Notes (Signed)
Pt arrives with complaints of cough, SOB, weakness x 1 week

## 2021-04-15 ENCOUNTER — Telehealth: Payer: Self-pay | Admitting: Cardiology

## 2021-04-15 ENCOUNTER — Ambulatory Visit: Payer: BLUE CROSS/BLUE SHIELD | Admitting: Cardiology

## 2021-04-15 ENCOUNTER — Other Ambulatory Visit: Payer: Self-pay

## 2021-04-15 ENCOUNTER — Encounter: Payer: Self-pay | Admitting: Cardiology

## 2021-04-15 VITALS — BP 133/80 | HR 66 | Ht 72.0 in | Wt 210.6 lb

## 2021-04-15 DIAGNOSIS — R5383 Other fatigue: Secondary | ICD-10-CM

## 2021-04-15 NOTE — Progress Notes (Signed)
Cardiology Office Note   Date:  04/15/2021   ID:  Bradley Watson, DOB 07-11-1961, MRN [id number]  PCP:  Bradley Grandchild, MD  Cardiologist:   Rollene Rotunda, MD Referring:  Bradley Grandchild, MD  Chief Complaint  Patient presents with  . Fatigue      History of Present Illness: Bradley Watson is a 60 y.o. male who is referred by Bradley Grandchild, MD for evaluation of a reduced EF.  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  He had LIMA to the LAD, left radial to the obtuse marginal. Echocardiogram November 2013 showed normal LV function, grade 1 diastolic dysfunction and mild left atrial enlargement. Nuclear study in June of 2015 showed an ejection fraction of 52%. Apical thinning but no ischemia. He had a repeat Lexiscan Myoview ordered by Bradley Grandchild, MD in Jan of last year.  This demonstrated a small apical reversible defect with an EF that was slightly lower than previous.  Echocardiogram dated 02/02/2020 revealed reduced EF of 45% to 50%, the left ventricle did have some mildly decreased function.  He was found to have grade 1 diastolic dysfunction.  RV systolic function was also moderately reduced.  Since I last saw him he presented last week the the ED with extreme fatigue.  I reviewed these records for this visit.   He had wheezing per his report and was treated with albuterol.  He had no abnormalities on EKG, CXR or labs.  Trop, TSH and BNP were normal.  He was bradycardic.   He has had episodes of decreased energy.  He has profound episodes of fatigue.  This has been new over the 3 to 4 weeks.  He says he is tired all the time though.  He is not describing orthostatic symptoms.  He is not describing palpitations.  He has not had any presyncope or syncope.  Past Medical History:  Diagnosis Date  . Carotid stenosis    Pre-CABG Dopplers: RICA 40-59%;  Carotid US (05/2014):  R 1-39%; normal LICA  . Cataract    OU  . Childhood asthma   . Cluster headaches 1985   Mid 80's  . Coronary  artery disease    a.  LHC 10/27/12 demonstrated pLAD 70%, AV CFX occluded, pOM 80%, dCFX collateralized the LAD and RCA, RCA without obstructive lesions, EF 50% with inferobasal HK;  b.  Echo 10/30/12: EF 55-60%, grade 1 diastolic dysfunction, mild LAE, normal LV wall motion;  c. s/p CABG 10/31/12 with Dr. Dorris Fetch:  L-LAD, left radial-OM;   b.  ETT-Nuc (05/2014):  + ECG changes, no ischemia; low risk  . Diverticulitis 02-2012   CT scan   . Fatty liver 02-2012   Mild- CT scan   . GERD (gastroesophageal reflux disease)   . HLD (hyperlipidemia)   . Iron deficiency anemia 1980's  . Type II diabetes mellitus with manifestations [facility])     Past Surgical History:  Procedure Laterality Date  . CARDIAC CATHETERIZATION  10/27/2012  . cataract  2020  . CORONARY ARTERY BYPASS GRAFT  10/31/2012   Procedure: CORONARY ARTERY BYPASS GRAFTING (CABG);  Surgeon: Loreli Slot, MD;  Location: [facility] OR;  Service: Open Heart Surgery;  Laterality: N/A;  . LEFT HEART CATHETERIZATION WITH CORONARY ANGIOGRAM N/A 10/27/2012   Procedure: LEFT HEART CATHETERIZATION WITH CORONARY ANGIOGRAM;  Surgeon: Herby Abraham, MD;  Location: [facility] CATH LAB;  Service: Cardiovascular;  Laterality: N/A;  . RADIAL ARTERY HARVEST  10/31/2012   Procedure:  RADIAL ARTERY HARVEST;  Surgeon: Loreli Slot, MD;  Location: [facility] OR;  Service: Open Heart Surgery;  Laterality: Left;  . RADIAL KERATOTOMY  1985   bilaterally     Current Outpatient Medications  Medication Sig Dispense Refill  . albuterol (VENTOLIN HFA) 108 (90 Base) MCG/ACT inhaler Inhale 2 puffs into the lungs every 6 (six) hours as needed for wheezing or shortness of breath. 8 g 2  . ASPIRIN CHILDRENS 81 MG chewable tablet Chew by mouth.    . brimonidine (ALPHAGAN) 0.2 % ophthalmic solution Place 1 drop into both eyes 3 (three) times daily. (Patient taking differently: Place 1 drop into both eyes 2 (two) times daily. ) 10 mL 10  . cefdinir (OMNICEF) 300 MG capsule  Take 1 capsule (300 mg total) by mouth 2 (two) times daily. 6 capsule 0  . Fluticasone-Umeclidin-Vilant (TRELEGY ELLIPTA) 200-62.5-25 MCG/INH AEPB Inhale 1 puff into the lungs daily. 120 each 1  . hydrOXYzine (ATARAX/VISTARIL) 10 MG tablet Take 30-60 mg by mouth daily as needed for itching.     . losartan (COZAAR) 25 MG tablet Take 1 tablet (25 mg total) by mouth daily. 30 tablet 12  . omeprazole (PRILOSEC) 20 MG capsule Take 2 tabs daily 30 minutes before a meal for 2 weeks and then 1 tab daily 30 minutes before a meal 60 capsule 3  . rosuvastatin (CRESTOR) 40 MG tablet Take 1 tablet (40 mg total) by mouth daily. 60 tablet 12  . tacrolimus (PROTOPIC) 0.1 % ointment APP EXT TO THE SKIN BID    . triamcinolone ointment (KENALOG) 0.1 % APPLY ON THE SKIN TWICE A DAY AS NEEDED     No current facility-administered medications for this visit.    Allergies:   Penicillins and Lisinopril    ROS:  Please see the history of present illness.   Otherwise, review of systems are positive for none.   All other systems are reviewed and negative.    PHYSICAL EXAM: VS:  BP 133/80   Pulse 66   Ht 6' (1.829 m)   Wt 210 lb 9.6 oz (95.5 kg)   SpO2 94%   BMI 28.56 kg/m  , BMI Body mass index is 28.56 kg/m. GENERAL:  Well appearing NECK:  No jugular venous distention, waveform within normal limits, carotid upstroke brisk and symmetric, no bruits, no thyromegaly LUNGS:  Clear to auscultation bilaterally CHEST:  Unremarkable HEART:  PMI not displaced or sustained,S1 and S2 within normal limits, no S3, no S4, no clicks, no rubs, no murmurs ABD:  Flat, positive bowel sounds normal in frequency in pitch, no bruits, no rebound, no guarding, no midline pulsatile mass, no hepatomegaly, no splenomegaly EXT:  2 plus pulses throughout, no edema, no cyanosis no clubbing    EKG:  EKG is not ordered today. The ekg ordered 04/04/21 demonstrates sinus rhythm, rate 52, axis within normal limits, intervals within limits,  no acute ST-T wave changes.   Recent Labs: 04/04/2021: ALT 14; B Natriuretic Peptide 37.5; BUN 16; Creatinine, Ser 1.10; Hemoglobin 13.9; Platelets 206; Potassium 4.2; Sodium 137; TSH 0.972    Lipid Panel    Component Value Date/Time   CHOL 208 (H) 08/27/2020 1104   TRIG 89 08/27/2020 1104   HDL 54 08/27/2020 1104   CHOLHDL 3.9 08/27/2020 1104   CHOLHDL 5 02/27/2020 0855   VLDL 17.8 02/27/2020 0855   LDLCALC 138 (H) 08/27/2020 1104      Wt Readings from Last 3 Encounters:  04/15/21 210 lb 9.6  oz (95.5 kg)  04/04/21 210 lb (95.3 kg)  02/28/21 215 lb 9.6 oz (97.8 kg)      Other studies Reviewed: Additional studies/ records that were reviewed today include:   ED notes  Review of the above records demonstrates:  Please see elsewhere in the note.     ASSESSMENT AND PLAN:  CAD:   He is not having any anginal symptoms.  I do not think that his profound weakness is necessarily an anginal equivalent.  However, we will keep this in consideration if he does not improve with the steps below.    ISCHEMIC CARDIOMYOPATHY:     I am going to stop his carvedilol on the outside chance that this is contributing.  This will be addressed as below.   HTN:   The blood pressure is at target.  However, I will change meds as below.   FATIGUE: The etiology of this is not clear.  I am going to have him hold his carvedilol.  If he does not get better he can restart it as it would not have been related.  If he does get better I would still like to rechallenge him with that to see if any improvement is not coincidental.  I also will be applying a 3-day Zio patch to make sure he is not having any symptomatic bradycardia arrhythmias though it does not sound like this.  DM (borderline): A1c was 6.2 previously.  No change in therapy.   DYSLIPIDEMIA: LDL most recently was 138 down from 236 as previously reported.   At the last visit I did increase his Crestor and he is to get a follow-up lipid profile.  If  this has not been done by the time we see him back we should order this.  CAROTID STENOSIS: He had very mild plaque in 2017.  No further imaging.     Current medicines are reviewed at length with the patient today.  The patient does not have concerns regarding medicines.  The following changes have been made: As above  Labs/ tests ordered today include:   No orders of the defined types were placed in this encounter.    Disposition:   FU with APP or me in 1 month.      Signed, Rollene Rotunda, MD  04/15/2021 3:16 PM    [facility]

## 2021-04-15 NOTE — Telephone Encounter (Signed)
Spoke with pt who report for the past two weeks he's been experiencing extreme fatigue. He state he was evaluated in the ER on 4/15 but they were unable to find a cause. Pt state he is desperately needing an appointment because it's starting to interfere with work.  Appointment scheduled for today 4/26 at 2 pm with Dr. Antoine Poche for further evaluations.

## 2021-04-15 NOTE — Patient Instructions (Signed)
Medication Instructions:   STOP Carvedilol (Coreg)   *If you need a refill on your cardiac medications before your next appointment, please call your pharmacy*  Lab Work: NONE ordered at this time of appointment   If you have labs (blood work) drawn today and your tests are completely normal, you will receive your results only by: Marland Kitchen MyChart Message (if you have MyChart) OR . A paper copy in the mail If you have any lab test that is abnormal or we need to change your treatment, we will call you to review the results.  Testing/Procedures: NONE ordered at this time of appointment   Follow-Up: At [facility], you and your health needs are our priority.  As part of our continuing mission to provide you with exceptional heart care, we have created designated Provider Care Teams.  These Care Teams include your primary Cardiologist (physician) and Advanced Practice Providers (APPs -  Physician Assistants and Nurse Practitioners) who all work together to provide you with the care you need, when you need it.  Your next appointment:   1 month(s)  The format for your next appointment:   In Person  Provider:   APP  Other Instructions

## 2021-04-15 NOTE — Telephone Encounter (Signed)
Bradley Watson is calling requesting to be worked in for a sooner appointment than 05/02/21, Dr. Jenene Slicker first available at this time. There is nothing available for sooner besides virtual's 05/06 with [name]. Patient states he has been having extreme fatigue to the point of being unable to work. Please advise.

## 2021-05-01 ENCOUNTER — Ambulatory Visit (INDEPENDENT_AMBULATORY_CARE_PROVIDER_SITE_OTHER): Payer: BLUE CROSS/BLUE SHIELD

## 2021-05-01 ENCOUNTER — Encounter: Payer: Self-pay | Admitting: Radiology

## 2021-05-01 DIAGNOSIS — R001 Bradycardia, unspecified: Secondary | ICD-10-CM

## 2021-05-01 DIAGNOSIS — R5383 Other fatigue: Secondary | ICD-10-CM

## 2021-05-01 NOTE — Progress Notes (Signed)
Enrolled patient for a 3 day Zio XT Monitor to be mailed to patients home.  °

## 2021-05-07 DIAGNOSIS — R5383 Other fatigue: Secondary | ICD-10-CM | POA: Diagnosis not present

## 2021-05-07 DIAGNOSIS — R001 Bradycardia, unspecified: Secondary | ICD-10-CM | POA: Diagnosis not present

## 2021-05-15 DIAGNOSIS — R001 Bradycardia, unspecified: Secondary | ICD-10-CM | POA: Diagnosis not present

## 2021-05-15 DIAGNOSIS — R5383 Other fatigue: Secondary | ICD-10-CM | POA: Diagnosis not present

## 2021-05-23 ENCOUNTER — Encounter: Payer: Self-pay | Admitting: *Deleted

## 2021-06-02 ENCOUNTER — Encounter: Payer: BLUE CROSS/BLUE SHIELD | Admitting: Physician Assistant

## 2021-06-02 NOTE — Progress Notes (Signed)
This encounter was created in error - please disregard.

## 2021-07-08 DIAGNOSIS — H401132 Primary open-angle glaucoma, bilateral, moderate stage: Secondary | ICD-10-CM | POA: Diagnosis not present

## 2021-07-08 DIAGNOSIS — H5213 Myopia, bilateral: Secondary | ICD-10-CM | POA: Diagnosis not present

## 2021-07-28 DIAGNOSIS — L2084 Intrinsic (allergic) eczema: Secondary | ICD-10-CM | POA: Diagnosis not present

## 2021-11-10 DIAGNOSIS — L2089 Other atopic dermatitis: Secondary | ICD-10-CM | POA: Diagnosis not present

## 2021-11-10 DIAGNOSIS — L218 Other seborrheic dermatitis: Secondary | ICD-10-CM | POA: Diagnosis not present

## 2021-11-10 DIAGNOSIS — L810 Postinflammatory hyperpigmentation: Secondary | ICD-10-CM | POA: Diagnosis not present

## 2021-12-13 ENCOUNTER — Other Ambulatory Visit: Payer: Self-pay | Admitting: Internal Medicine

## 2021-12-13 DIAGNOSIS — J4541 Moderate persistent asthma with (acute) exacerbation: Secondary | ICD-10-CM

## 2021-12-16 ENCOUNTER — Other Ambulatory Visit: Payer: Self-pay | Admitting: Internal Medicine

## 2021-12-16 ENCOUNTER — Telehealth: Payer: Self-pay

## 2021-12-16 DIAGNOSIS — J4540 Moderate persistent asthma, uncomplicated: Secondary | ICD-10-CM | POA: Insufficient documentation

## 2021-12-16 MED ORDER — TRELEGY ELLIPTA 100-62.5-25 MCG/ACT IN AEPB: 1.0000 | INHALATION_SPRAY | Freq: Every day | RESPIRATORY_TRACT | 0 refills | Status: DC

## 2021-12-16 NOTE — Telephone Encounter (Signed)
Pt is requesting Rx refill for Trelegy Ellipta. LOV 10/28/20.  Scheduled appt today for 01/19/22 9:40

## 2021-12-23 ENCOUNTER — Emergency Department (HOSPITAL_BASED_OUTPATIENT_CLINIC_OR_DEPARTMENT_OTHER)
Admission: EM | Admit: 2021-12-23 | Discharge: 2021-12-23 | Disposition: A | Discharge status: Home / Self Care | Payer: BLUE CROSS/BLUE SHIELD | Attending: Emergency Medicine | Admitting: Emergency Medicine

## 2021-12-23 ENCOUNTER — Encounter (HOSPITAL_BASED_OUTPATIENT_CLINIC_OR_DEPARTMENT_OTHER): Payer: Self-pay

## 2021-12-23 ENCOUNTER — Emergency Department (HOSPITAL_BASED_OUTPATIENT_CLINIC_OR_DEPARTMENT_OTHER)
Admission: EM | Admit: 2021-12-23 | Discharge: 2021-12-23 | Disposition: A | Discharge status: Home / Self Care | Payer: BLUE CROSS/BLUE SHIELD | Source: Home / Self Care | Attending: Emergency Medicine | Admitting: Emergency Medicine

## 2021-12-23 ENCOUNTER — Other Ambulatory Visit: Payer: Self-pay

## 2021-12-23 DIAGNOSIS — Z7951 Long term (current) use of inhaled steroids: Secondary | ICD-10-CM | POA: Insufficient documentation

## 2021-12-23 DIAGNOSIS — Z5321 Procedure and treatment not carried out due to patient leaving prior to being seen by health care provider: Secondary | ICD-10-CM | POA: Insufficient documentation

## 2021-12-23 DIAGNOSIS — Z7982 Long term (current) use of aspirin: Secondary | ICD-10-CM | POA: Insufficient documentation

## 2021-12-23 DIAGNOSIS — U071 COVID-19: Secondary | ICD-10-CM | POA: Insufficient documentation

## 2021-12-23 DIAGNOSIS — R002 Palpitations: Secondary | ICD-10-CM | POA: Diagnosis not present

## 2021-12-23 DIAGNOSIS — J45909 Unspecified asthma, uncomplicated: Secondary | ICD-10-CM | POA: Insufficient documentation

## 2021-12-23 LAB — RESP PANEL BY RT-PCR (FLU A&B, COVID) ARPGX2
Influenza A by PCR: NEGATIVE
Influenza B by PCR: NEGATIVE
SARS Coronavirus 2 by RT PCR: POSITIVE — AB

## 2021-12-23 MED ORDER — DEXAMETHASONE 4 MG PO TABS
10.0000 mg | ORAL_TABLET | Freq: Once | ORAL | Status: AC
  Administered 2021-12-23: 10 mg via ORAL
  Filled 2021-12-23: qty 3

## 2021-12-23 MED ORDER — ALBUTEROL SULFATE HFA 108 (90 BASE) MCG/ACT IN AERS
4.0000 | INHALATION_SPRAY | Freq: Once | RESPIRATORY_TRACT | Status: AC
  Administered 2021-12-23: 4 via RESPIRATORY_TRACT
  Filled 2021-12-23: qty 6.7

## 2021-12-23 NOTE — ED Triage Notes (Signed)
Pt c/o flu like sx x 1 week-c/o palpitations x "months"-denies CP/pain-has not sought medical treatment-NAD-steady gait

## 2021-12-23 NOTE — ED Triage Notes (Signed)
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

## 2021-12-23 NOTE — ED Provider Notes (Signed)
[facility] EMERGENCY DEPARTMENT Provider Note   CSN: [id number] Arrival date & time: 12/23/21  1839     History  Chief Complaint  Patient presents with   Covid Positive    Bradley Watson is a 61 y.o. male.  The history is provided by the patient.  URI Presenting symptoms: congestion and cough   Presenting symptoms: no ear pain, no fever and no sore throat   Cough:    Cough characteristics:  Non-productive   Sputum characteristics:  Nondescript   Severity:  Mild   Onset quality:  Gradual   Duration:  1 week   Timing:  Intermittent   Progression:  Waxing and waning   Chronicity:  New Relieved by:  Nothing Worsened by:  Nothing Associated symptoms: myalgias and wheezing   Associated symptoms: no arthralgias, no headaches, no neck pain, no sinus pain, no sneezing and no swollen glands   Risk factors: no sick contacts       Home Medications Prior to Admission medications   Medication Sig Start Date End Date Taking? Authorizing Provider  albuterol (VENTOLIN HFA) 108 (90 Base) MCG/ACT inhaler Inhale 2 puffs into the lungs every 6 (six) hours as needed for wheezing or shortness of breath. 10/08/20   Binnie Rail, MD  ASPIRIN CHILDRENS 81 MG chewable tablet Chew by mouth. 07/22/20   [provider]  brimonidine (ALPHAGAN) 0.2 % ophthalmic solution Place 1 drop into both eyes 3 (three) times daily. Patient taking differently: Place 1 drop into both eyes 2 (two) times daily.  07/28/19   Bernarda Caffey, MD  cefdinir (OMNICEF) 300 MG capsule Take 1 capsule (300 mg total) by mouth 2 (two) times daily. 02/28/21   Minus Breeding, MD  hydrOXYzine (ATARAX/VISTARIL) 10 MG tablet Take 30-60 mg by mouth daily as needed for itching.  01/04/19   [provider]  losartan (COZAAR) 25 MG tablet Take 1 tablet (25 mg total) by mouth daily. 02/28/21 05/29/21  Minus Breeding, MD  omeprazole (PRILOSEC) 20 MG capsule Take 2 tabs daily 30 minutes before a meal for 2 weeks and  then 1 tab daily 30 minutes before a meal 10/08/20   Burns, Claudina Lick, MD  rosuvastatin (CRESTOR) 40 MG tablet Take 1 tablet (40 mg total) by mouth daily. 02/28/21   Minus Breeding, MD  tacrolimus (PROTOPIC) 0.1 % ointment APP EXT TO THE SKIN BID 06/21/19   [provider]  TRELEGY ELLIPTA 100-62.5-25 MCG/ACT AEPB Inhale 1 puff into the lungs daily. 12/16/21   Janith Lima, MD  triamcinolone ointment (KENALOG) 0.1 % APPLY ON THE SKIN TWICE A DAY AS NEEDED 05/04/19   [provider]      Allergies    Penicillins and Lisinopril    Review of Systems   Review of Systems  Constitutional:  Negative for chills and fever.  HENT:  Positive for congestion. Negative for ear pain, sinus pain, sneezing and sore throat.   Eyes:  Negative for pain and visual disturbance.  Respiratory:  Positive for cough and wheezing. Negative for shortness of breath.   Cardiovascular:  Negative for chest pain and palpitations.  Gastrointestinal:  Negative for abdominal pain and vomiting.  Genitourinary:  Negative for dysuria and hematuria.  Musculoskeletal:  Positive for myalgias. Negative for arthralgias, back pain and neck pain.  Skin:  Negative for color change and rash.  Neurological:  Negative for seizures, syncope and headaches.  All other systems reviewed and are negative.  Physical Exam Updated Vital Signs  BP (!) 166/99 (BP Location: Left Arm)    Pulse (!) 57    Temp 98.7 F (37.1 C) (Oral)    Resp 20    SpO2 98%  Physical Exam Vitals and nursing note reviewed.  Constitutional:      General: He is not in acute distress.    Appearance: He is well-developed. He is not ill-appearing.  HENT:     Head: Normocephalic and atraumatic.     Nose: Nose normal.     Mouth/Throat:     Mouth: Mucous membranes are moist.  Eyes:     Conjunctiva/sclera: Conjunctivae normal.  Cardiovascular:     Rate and Rhythm: Normal rate and regular rhythm.     Heart sounds: No murmur heard. Pulmonary:      Effort: Pulmonary effort is normal. No respiratory distress.     Breath sounds: Wheezing present.  Abdominal:     Palpations: Abdomen is soft.     Tenderness: There is no abdominal tenderness.  Musculoskeletal:        General: No swelling.     Cervical back: Neck supple.  Skin:    General: Skin is warm and dry.     Capillary Refill: Capillary refill takes less than 2 seconds.  Neurological:     General: No focal deficit present.     Mental Status: He is alert.  Psychiatric:        Mood and Affect: Mood normal.    ED Results / Procedures / Treatments   Labs (all labs ordered are listed, but only abnormal results are displayed) Labs Reviewed - No data to display  EKG None  Radiology No results found.  Procedures Procedures    Medications Ordered in ED Medications  dexamethasone (DECADRON) tablet 10 mg (10 mg Oral Given 12/23/21 2138)  albuterol (VENTOLIN HFA) 108 (90 Base) MCG/ACT inhaler 4 puff (4 puffs Inhalation Given 12/23/21 2142)    ED Course/ Medical Decision Making/ A&P                           Medical Decision Making  Bradley Watson is here with upper respiratory symptoms.  Positive for COVID.  Unremarkable vitals.  No fever.  Symptoms for about a week.  Has history of asthma.  He is outside the window for treatment with antiviral.  Has very mild wheezing on exam.  Normal room air oxygenation both at rest and with ambulation.  Will treat with Decadron and albuterol.  No concern for other acute process.  Appears well.  Discharged in good condition.  Understands return precautions.  This chart was dictated using voice recognition software.  Despite best efforts to proofread,  errors can occur which can change the documentation meaning.         Final Clinical Impression(s) / ED Diagnoses Final diagnoses:  [id number]    Rx / DC Orders ED Discharge Orders     None         Lennice Sites, DO 12/23/21 2147

## 2021-12-29 ENCOUNTER — Telehealth: Payer: Self-pay | Admitting: Cardiology

## 2021-12-29 NOTE — Telephone Encounter (Signed)
Pt c/o of Chest Pain: STAT if CP now or developed within 24 hours  1. Are you having CP right now? no  2. Are you experiencing any other symptoms (ex. SOB, nausea, vomiting, sweating)? Weakness, feels his heartbeat is very rapid.  He has to lay down then his heartbeat goes back to normal.  Sometimes it's chest pain sometimes it a burning feeling.   3. How long have you been experiencing CP? For a few years, now it's happening 2-3 times a month. Now since he had COVID.   4. Is your CP continuous or coming and going? Comes and goes.   5. Have you taken Nitroglycerin? No, doesn't have any.  ?  I did schedule him for 1/25 with Dr. Antoine Poche.

## 2021-12-29 NOTE — Telephone Encounter (Signed)
Spoke with patient who reports chest pain/burning and both arms hurting while vacuuming. States after resting 15-60 minutes, the sensation goes away. This occurs 2-3 times a month, has not been taking losartan for approximately 3 months. Stated has no refills. His BP in the ER was 166/99 and 180/100. He wants a refill on losartan and wants to know if he should return to work tomorrow. He is scheduled to see Dr. Percival Spanish on 1/12. Was advised to go to the ER when the sensation in his chest occurs again. Voiced understanding of this conversation and said he will see Dr. Percival Spanish this Thursday.

## 2022-01-01 ENCOUNTER — Other Ambulatory Visit: Payer: Self-pay

## 2022-01-01 ENCOUNTER — Ambulatory Visit: Payer: BLUE CROSS/BLUE SHIELD | Admitting: Cardiology

## 2022-01-01 ENCOUNTER — Encounter: Payer: Self-pay | Admitting: Cardiology

## 2022-01-01 VITALS — BP 120/81 | HR 60 | Ht 72.0 in | Wt 214.8 lb

## 2022-01-01 DIAGNOSIS — I10 Essential (primary) hypertension: Secondary | ICD-10-CM | POA: Diagnosis not present

## 2022-01-01 DIAGNOSIS — R5383 Other fatigue: Secondary | ICD-10-CM | POA: Diagnosis not present

## 2022-01-01 DIAGNOSIS — E118 Type 2 diabetes mellitus with unspecified complications: Secondary | ICD-10-CM

## 2022-01-01 DIAGNOSIS — I255 Ischemic cardiomyopathy: Secondary | ICD-10-CM | POA: Diagnosis not present

## 2022-01-01 DIAGNOSIS — E785 Hyperlipidemia, unspecified: Secondary | ICD-10-CM

## 2022-01-01 DIAGNOSIS — I2510 Atherosclerotic heart disease of native coronary artery without angina pectoris: Secondary | ICD-10-CM

## 2022-01-01 DIAGNOSIS — I6523 Occlusion and stenosis of bilateral carotid arteries: Secondary | ICD-10-CM

## 2022-01-01 MED ORDER — CARVEDILOL 3.125 MG PO TABS: 3.1250 mg | ORAL_TABLET | Freq: Two times a day (BID) | ORAL | 3 refills | Status: DC

## 2022-01-01 NOTE — Patient Instructions (Addendum)
Medication Instructions:  RESUME carvedilol 3.125mg  twice daily  *If you need a refill on your cardiac medications before your next appointment, please call your pharmacy*  Lab work: FASTING lipid panel to check cholesterol before your next visit  Testing/Procedures: Your physician has requested that you have an echocardiogram. Echocardiography is a painless test that uses sound waves to create images of your heart. It provides your doctor with information about the size and shape of your heart and how well your hearts chambers and valves are working. This procedure takes approximately one hour. There are no restrictions for this procedure.   Follow-Up: At [facility], you and your health needs are our priority.  As part of our continuing mission to provide you with exceptional heart care, we have created designated Provider Care Teams.  These Care Teams include your primary Cardiologist (physician) and Advanced Practice Providers (APPs -  Physician Assistants and Nurse Practitioners) who all work together to provide you with the care you need, when you need it.  We recommend signing up for the patient portal called "MyChart".  Sign up information is provided on this After Visit Summary.  MyChart is used to connect with patients for Virtual Visits (Telemedicine).  Patients are able to view lab/test results, encounter notes, upcoming appointments, etc.  Non-urgent messages can be sent to your provider as well.   To learn more about what you can do with MyChart, go to [email or web address].    Your next appointment:   4 week(s)  The format for your next appointment:   In Person  Provider:   Rollene Rotunda, MD { (OK to double book)

## 2022-01-01 NOTE — Progress Notes (Signed)
Cardiology Office Note   Date:  01/01/2022   ID:  Bradley Watson, DOB 06/07/1961, MRN [id number]  PCP:  Bradley Lima, MD  Cardiologist:   Minus Breeding, MD Referring:  Bradley Lima, MD  Chief Complaint  Patient presents with   Palpitations      History of Present Illness: Bradley Watson is a 61 y.o. male who is referred by Bradley Lima, MD for evaluation of a reduced EF.  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  He had Watson to the LAD, left radial to the obtuse marginal. Echocardiogram November 2013 showed normal LV function, grade 1 diastolic dysfunction and mild left atrial enlargement. Nuclear study in June of 2015 showed an ejection fraction of 52%. Apical thinning but no ischemia. He had a repeat Lexiscan Myoview ordered by Bradley Lima, MD in Jan of last year.  This demonstrated a small apical reversible defect with an EF that was slightly lower than previous.  Echocardiogram dated 02/02/2020 revealed reduced EF of 45% to 50%, the left ventricle did have some mildly decreased function.  He was found to have grade 1 diastolic dysfunction.  RV systolic function was also moderately reduced.  Since I last saw him he was in the emergency room on the third of this month.  He had what he thought were flulike symptoms and palpitations.  He tested positive for COVID.  He was given a dose of steroids and bronchodilators and sent on his way.  He says he is thought he was slowly recovering and says that he tested negative recently.  However he still has cough and wheezing.  He is getting palpitations.  He says if he goes to vacuum the house he feels his heart racing.  He has to stop and let himself recover.  His episodes last for about 30 minutes.  He has been having continued cough nonproductive.  He is not having any chest pressure though he has some burning discomfort with his cough.  He has not had any weight gain or edema.  He denies any PND or orthopnea.  He is not sure that this is at  all similar to his previous symptoms prior to his bypass.   Past Medical History:  Diagnosis Date   Carotid stenosis    Pre-CABG Dopplers: RICA 40-59%;  Carotid US (05/2014):  R [id number]; normal LICA   Cataract    OU   Childhood asthma    Cluster headaches 1985   Mid 80's   Coronary artery disease    a.  LHC 10/27/12 demonstrated pLAD 70%, AV CFX occluded, pOM 80%, dCFX collateralized the LAD and RCA, RCA without obstructive lesions, EF 50% with inferobasal HK;  b.  Echo 10/30/12: EF 0000000, grade 1 diastolic dysfunction, mild LAE, normal LV wall motion;  c. s/p CABG 10/31/12 with Dr. Roxan Watson:  L-LAD, left radial-OM;   b.  ETT-Nuc (05/2014):  + ECG changes, no ischemia; low risk   Diverticulitis 02-2012   CT scan    Fatty liver 02-2012   Mild- CT scan    GERD (gastroesophageal reflux disease)    HLD (hyperlipidemia)    Iron deficiency anemia 1980's   Type II diabetes mellitus with manifestations [facility])     Past Surgical History:  Procedure Laterality Date   CARDIAC CATHETERIZATION  10/27/2012   cataract  2020   CORONARY ARTERY BYPASS GRAFT  10/31/2012   Procedure: CORONARY ARTERY BYPASS GRAFTING (CABG);  Surgeon: Bradley Nakayama, MD;  Location: [facility] OR;  Service: Open Heart Surgery;  Laterality: N/A;   LEFT HEART CATHETERIZATION WITH CORONARY ANGIOGRAM N/A 10/27/2012   Procedure: LEFT HEART CATHETERIZATION WITH CORONARY ANGIOGRAM;  Surgeon: Bradley Bow, MD;  Location[facility] CATH LAB;  Service: Cardiovascular;  Laterality: N/A;   RADIAL ARTERY HARVEST  10/31/2012   Procedure: RADIAL ARTERY HARVEST;  Surgeon: Bradley Nakayama, MD;  Location: [place];  Service: Open Heart Surgery;  Laterality: Left;   RADIAL KERATOTOMY  1985   bilaterally     Current Outpatient Medications  Medication Sig Dispense Refill   albuterol (VENTOLIN HFA) 108 (90 Base) MCG/ACT inhaler Inhale 2 puffs into the lungs every 6 (six) hours as needed for wheezing or shortness of breath. 8 g 2   ASPIRIN  CHILDRENS 81 MG chewable tablet Chew by mouth.     brimonidine (ALPHAGAN) 0.2 % ophthalmic solution Place 1 drop into both eyes 3 (three) times daily. (Patient taking differently: Place 1 drop into both eyes 2 (two) times daily.) 10 mL 10   carvedilol (COREG) 3.125 MG tablet Take 1 tablet (3.125 mg total) by mouth 2 (two) times daily. 180 tablet 3   hydrOXYzine (ATARAX/VISTARIL) 10 MG tablet Take 30-60 mg by mouth daily as needed for itching.      losartan (COZAAR) 25 MG tablet Take 1 tablet (25 mg total) by mouth daily. 30 tablet 12   omeprazole (PRILOSEC) 20 MG capsule Take 2 tabs daily 30 minutes before a meal for 2 weeks and then 1 tab daily 30 minutes before a meal 60 capsule 3   rosuvastatin (CRESTOR) 40 MG tablet Take 1 tablet (40 mg total) by mouth daily. 60 tablet 12   tacrolimus (PROTOPIC) 0.1 % ointment APP EXT TO THE SKIN BID     TRELEGY ELLIPTA 100-62.5-25 MCG/ACT AEPB Inhale 1 puff into the lungs daily. 120 each 0   triamcinolone ointment (KENALOG) 0.1 % APPLY ON THE SKIN TWICE A DAY AS NEEDED     cefdinir (OMNICEF) 300 MG capsule Take 1 capsule (300 mg total) by mouth 2 (two) times daily. (Patient not taking: Reported on 01/01/2022) 6 capsule 0   No current facility-administered medications for this visit.    Allergies:   Penicillins and Lisinopril    ROS:  Please see the history of present illness.   Otherwise, review of systems are positive for none.   All other systems are reviewed and negative.    PHYSICAL EXAM: VS:  BP 120/81    Pulse 60    Ht 6' (1.829 m)    Wt 214 lb 12.8 oz (97.4 kg)    SpO2 100%    BMI 29.13 kg/m  , BMI Body mass index is 29.13 kg/m. GENERAL:  Well appearing NECK:  No jugular venous distention, waveform within normal limits, carotid upstroke brisk and symmetric, no bruits, no thyromegaly LUNGS: Diffuse expiratory wheezes CHEST: Well-healed surgical scar HEART:  PMI not displaced or sustained,S1 and S2 within normal limits, no S3, no S4, no clicks,  no rubs, no murmurs ABD:  Flat, positive bowel sounds normal in frequency in pitch, no bruits, no rebound, no guarding, no midline pulsatile mass, no hepatomegaly, no splenomegaly EXT:  2 plus pulses throughout, no edema, no cyanosis no clubbing   EKG:  EKG is  ordered today. The ekg ordered 01/01/2022 demonstrates sinus rhythm, rate 60, axis within normal limits, intervals within limits, no acute ST-T wave changes.   Recent Labs: 04/04/2021: ALT 14; B Natriuretic Peptide 37.5;  BUN 16; Creatinine, Ser 1.10; Hemoglobin 13.9; Platelets 206; Potassium 4.2; Sodium 137; TSH 0.972    Lipid Panel    Component Value Date/Time   CHOL 208 (H) 08/27/2020 1104   TRIG 89 08/27/2020 1104   HDL 54 08/27/2020 1104   CHOLHDL 3.9 08/27/2020 1104   CHOLHDL 5 02/27/2020 0855   VLDL 17.8 02/27/2020 0855   LDLCALC 138 (H) 08/27/2020 1104      Wt Readings from Last 3 Encounters:  01/01/22 214 lb 12.8 oz (97.4 kg)  04/15/21 210 lb 9.6 oz (95.5 kg)  04/04/21 210 lb (95.3 kg)      Other studies Reviewed: Additional studies/ records that were reviewed today include:   Review of the above records demonstrates:  Please see elsewhere in the note.     ASSESSMENT AND PLAN:  CAD:    The patient does not have classic anginal symptoms.  I think what he is experiencing with rapid rate and atypical chest discomfort is probably COVID-related and I did send a note to his primary provider asking if they could potentially move up his follow-up appointment or whether they wanted to give him steroids or other post COVID therapies.  I do not think an ischemia work-up is indicated at this point.   PALPITATIONS: He is having palpitations but he did wear a monitor in May of last year.  He had some SVT and he could be experiencing this.  However, with his recent COVID diagnosis and ongoing wheezing I think this needs to be managed before significant change.  I am going to restart his at 3.125 mg twice daily.  ISCHEMIC  CARDIOMYOPATHY:    As above he has been intolerant of meds and I am going to restart his carvedilol 3.125 mg twice daily.  I have to titrate slowly because of his medication sensitivities.  I will be checking an echocardiogram.  HTN:   The blood pressure is at target.  Check DM (borderline): A1c was 6.2.  No change in therapy.   DYSLIPIDEMIA: LDL  When he comes back I will get a lipid profile.   CAROTID STENOSIS: He had very mild plaque.  No change in therapy.    Current medicines are reviewed at length with the patient today.  The patient does not have concerns regarding medicines.  The following changes have been made: None    Labs/ tests ordered today include:   Orders Placed This Encounter  Procedures   Lipid Profile   EKG 12-Lead   ECHOCARDIOGRAM COMPLETE     Disposition:   FU with me in one month.      Signed, Minus Breeding, MD  01/01/2022 3:52 PM    [facility]

## 2022-01-02 ENCOUNTER — Ambulatory Visit: Payer: BLUE CROSS/BLUE SHIELD | Admitting: Internal Medicine

## 2022-01-02 ENCOUNTER — Ambulatory Visit (INDEPENDENT_AMBULATORY_CARE_PROVIDER_SITE_OTHER): Payer: BLUE CROSS/BLUE SHIELD

## 2022-01-02 ENCOUNTER — Encounter: Payer: Self-pay | Admitting: Internal Medicine

## 2022-01-02 ENCOUNTER — Telehealth: Payer: Self-pay

## 2022-01-02 VITALS — BP 132/86 | HR 58 | Temp 97.6°F | Ht 72.0 in | Wt 219.0 lb

## 2022-01-02 DIAGNOSIS — R052 Subacute cough: Secondary | ICD-10-CM | POA: Diagnosis not present

## 2022-01-02 DIAGNOSIS — R6882 Decreased libido: Secondary | ICD-10-CM | POA: Diagnosis not present

## 2022-01-02 DIAGNOSIS — E519 Thiamine deficiency, unspecified: Secondary | ICD-10-CM

## 2022-01-02 DIAGNOSIS — D520 Dietary folate deficiency anemia: Secondary | ICD-10-CM

## 2022-01-02 DIAGNOSIS — J208 Acute bronchitis due to other specified organisms: Secondary | ICD-10-CM

## 2022-01-02 DIAGNOSIS — J4540 Moderate persistent asthma, uncomplicated: Secondary | ICD-10-CM | POA: Diagnosis not present

## 2022-01-02 DIAGNOSIS — U071 Acute bronchitis due to other specified organisms: Secondary | ICD-10-CM

## 2022-01-02 DIAGNOSIS — R059 Cough, unspecified: Secondary | ICD-10-CM | POA: Diagnosis not present

## 2022-01-02 DIAGNOSIS — J4541 Moderate persistent asthma with (acute) exacerbation: Secondary | ICD-10-CM

## 2022-01-02 DIAGNOSIS — E118 Type 2 diabetes mellitus with unspecified complications: Secondary | ICD-10-CM | POA: Diagnosis not present

## 2022-01-02 DIAGNOSIS — I10 Essential (primary) hypertension: Secondary | ICD-10-CM

## 2022-01-02 LAB — CBC WITH DIFFERENTIAL/PLATELET
Basophils Absolute: 0.1 10*3/uL (ref 0.0–0.1)
Basophils Relative: 1.1 % (ref 0.0–3.0)
Eosinophils Absolute: 0.2 10*3/uL (ref 0.0–0.7)
Eosinophils Relative: 3.4 % (ref 0.0–5.0)
HCT: 40.5 % (ref 39.0–52.0)
Hemoglobin: 12.9 g/dL — ABNORMAL LOW (ref 13.0–17.0)
Lymphocytes Relative: 43.1 % (ref 12.0–46.0)
Lymphs Abs: 2.2 10*3/uL (ref 0.7–4.0)
MCHC: 31.9 g/dL (ref 30.0–36.0)
MCV: 90 fl (ref 78.0–100.0)
Monocytes Absolute: 0.7 10*3/uL (ref 0.1–1.0)
Monocytes Relative: 13 % — ABNORMAL HIGH (ref 3.0–12.0)
Neutro Abs: 2 10*3/uL (ref 1.4–7.7)
Neutrophils Relative %: 39.4 % — ABNORMAL LOW (ref 43.0–77.0)
Platelets: 243 10*3/uL (ref 150.0–400.0)
RBC: 4.5 Mil/uL (ref 4.22–5.81)
RDW: 14.6 % (ref 11.5–15.5)
WBC: 5.1 10*3/uL (ref 4.0–10.5)

## 2022-01-02 LAB — BASIC METABOLIC PANEL
BUN: 22 mg/dL (ref 6–23)
CO2: 27 mEq/L (ref 19–32)
Calcium: 9.6 mg/dL (ref 8.4–10.5)
Chloride: 99 mEq/L (ref 96–112)
Creatinine, Ser: 1.14 mg/dL (ref 0.40–1.50)
GFR: 68.72 mL/min (ref >60.00)
Glucose, Bld: 93 mg/dL (ref 70–99)
Potassium: 3.9 mEq/L (ref 3.5–5.1)
Sodium: 134 mEq/L — ABNORMAL LOW (ref 135–145)

## 2022-01-02 LAB — FOLATE: Folate: 10.5 ng/mL (ref >5.9)

## 2022-01-02 LAB — HEPATIC FUNCTION PANEL
ALT: 17 U/L (ref 0–53)
AST: 28 U/L (ref 0–37)
Albumin: 4.3 g/dL (ref 3.5–5.2)
Alkaline Phosphatase: 71 U/L (ref 39–117)
Bilirubin, Direct: 0.1 mg/dL (ref 0.0–0.3)
Total Bilirubin: 0.6 mg/dL (ref 0.2–1.2)
Total Protein: 7.6 g/dL (ref 6.0–8.3)

## 2022-01-02 LAB — HEMOGLOBIN A1C: Hgb A1c MFr Bld: 6.5 % (ref 4.6–6.5)

## 2022-01-02 LAB — TSH: TSH: 0.88 u[IU]/mL (ref 0.35–5.50)

## 2022-01-02 MED ORDER — METHYLPREDNISOLONE 4 MG PO TBPK: ORAL_TABLET | ORAL | 0 refills | Status: AC

## 2022-01-02 MED ORDER — HYDROCODONE BIT-HOMATROP MBR 5-1.5 MG/5ML PO SOLN: 5.0000 mL | Freq: Three times a day (TID) | ORAL | 0 refills | Status: AC | PRN

## 2022-01-02 NOTE — Telephone Encounter (Signed)
[name], MD  Elder Love, CMA Will you ask him if he is willing to come in this morning before 10:00 or between 2:00 to 3:00 to be evaluated for cough?    Called pt, LVM to discuss the above.

## 2022-01-02 NOTE — Patient Instructions (Signed)

## 2022-01-02 NOTE — Progress Notes (Signed)
Subjective:  Patient ID: [name], male    DOB: 1961-08-13  Age: 60 y.o. MRN: [id number]  CC: Cough  This visit occurred during the SARS-CoV-2 public health emergency.  Safety protocols were in place, including screening questions prior to the visit, additional usage of staff PPE, and extensive cleaning of exam room while observing appropriate contact time as indicated for disinfecting solutions.    HPI CHRISTY FRIEDE presents for f/up -   He was diagnosed with COVID-19 infection a few weeks ago.  He continues to complain of nonproductive cough and wheezing.  He also complains of fatigue, decreased muscle mass, low libido, and erectile dysfunction.  Outpatient Medications Prior to Visit  Medication Sig Dispense Refill   albuterol (VENTOLIN HFA) 108 (90 Base) MCG/ACT inhaler Inhale 2 puffs into the lungs every 6 (six) hours as needed for wheezing or shortness of breath. 8 g 2   ASPIRIN CHILDRENS 81 MG chewable tablet Chew by mouth.     brimonidine (ALPHAGAN) 0.2 % ophthalmic solution Place 1 drop into both eyes 3 (three) times daily. (Patient taking differently: Place 1 drop into both eyes 2 (two) times daily.) 10 mL 10   carvedilol (COREG) 3.125 MG tablet Take 1 tablet (3.125 mg total) by mouth 2 (two) times daily. 180 tablet 3   hydrOXYzine (ATARAX/VISTARIL) 10 MG tablet Take 30-60 mg by mouth daily as needed for itching.      omeprazole (PRILOSEC) 20 MG capsule Take 2 tabs daily 30 minutes before a meal for 2 weeks and then 1 tab daily 30 minutes before a meal 60 capsule 3   rosuvastatin (CRESTOR) 40 MG tablet Take 1 tablet (40 mg total) by mouth daily. 60 tablet 12   tacrolimus (PROTOPIC) 0.1 % ointment APP EXT TO THE SKIN BID     TRELEGY ELLIPTA 100-62.5-25 MCG/ACT AEPB Inhale 1 puff into the lungs daily. 120 each 0   triamcinolone ointment (KENALOG) 0.1 % APPLY ON THE SKIN TWICE A DAY AS NEEDED     cefdinir (OMNICEF) 300 MG capsule Take 1 capsule (300 mg total) by mouth 2  (two) times daily. 6 capsule 0   losartan (COZAAR) 25 MG tablet Take 1 tablet (25 mg total) by mouth daily. 30 tablet 12   No facility-administered medications prior to visit.    ROS Review of Systems  Constitutional:  Positive for fatigue and unexpected weight change (wt gain). Negative for appetite change, chills and diaphoresis.  HENT: Negative.    Respiratory:  Positive for cough and wheezing. Negative for chest tightness and stridor.   Cardiovascular:  Negative for chest pain, palpitations and leg swelling.  Gastrointestinal:  Negative for abdominal pain, constipation, diarrhea, nausea and vomiting.  Endocrine: Negative.   Genitourinary: Negative.  Negative for difficulty urinating.  Musculoskeletal: Negative.   Skin: Negative.   Neurological: Negative.  Negative for dizziness, weakness, light-headedness and headaches.  Hematological:  Negative for adenopathy. Does not bruise/bleed easily.  Psychiatric/Behavioral: Negative.     Objective:  BP 132/86 (BP Location: Right Arm, Patient Position: Sitting, Cuff Size: Large)    Pulse (!) 58    Temp 97.6 F (36.4 C) (Oral)    Ht 6' (1.829 m)    Wt 219 lb (99.3 kg)    SpO2 95%    BMI 29.70 kg/m   BP Readings from Last 3 Encounters:  01/02/22 132/86  01/01/22 120/81  12/23/21 (!) 180/100    Wt Readings from Last 3 Encounters:  01/02/22 219 lb (99.3  kg)  01/01/22 214 lb 12.8 oz (97.4 kg)  04/15/21 210 lb 9.6 oz (95.5 kg)    Physical Exam Vitals reviewed.  Constitutional:      General: He is not in acute distress.    Appearance: He is not ill-appearing, toxic-appearing or diaphoretic.  HENT:     Mouth/Throat:     Mouth: Mucous membranes are moist.  Eyes:     General: No scleral icterus.    Conjunctiva/sclera: Conjunctivae normal.  Cardiovascular:     Rate and Rhythm: Regular rhythm. Bradycardia present.     Heart sounds: No murmur heard.   No friction rub. No gallop.  Pulmonary:     Effort: Pulmonary effort is normal.  No tachypnea or respiratory distress.     Breath sounds: No decreased air movement. Examination of the right-upper field reveals wheezing and rhonchi. Examination of the left-upper field reveals wheezing and rhonchi. Examination of the right-middle field reveals wheezing and rhonchi. Examination of the left-middle field reveals wheezing and rhonchi. Examination of the right-lower field reveals wheezing and rhonchi. Examination of the left-lower field reveals wheezing and rhonchi. Wheezing and rhonchi present. No decreased breath sounds or rales.  Abdominal:     General: Abdomen is flat.     Palpations: There is no mass.     Tenderness: There is no abdominal tenderness. There is no guarding.     Hernia: No hernia is present.  Musculoskeletal:        General: Normal range of motion.     Cervical back: Neck supple.     Right lower leg: No edema.     Left lower leg: No edema.  Lymphadenopathy:     Cervical: No cervical adenopathy.  Skin:    General: Skin is warm and dry.     Findings: No rash.  Neurological:     General: No focal deficit present.     Mental Status: He is alert. Mental status is at baseline.  Psychiatric:        Behavior: Behavior is slowed.    Lab Results  Component Value Date   WBC 5.1 01/02/2022   HGB 12.9 (L) 01/02/2022   HCT 40.5 01/02/2022   PLT 243.0 01/02/2022   GLUCOSE 93 01/02/2022   CHOL 208 (H) 08/27/2020   TRIG 89 08/27/2020   HDL 54 08/27/2020   LDLCALC 138 (H) 08/27/2020   ALT 17 01/02/2022   AST 28 01/02/2022   NA 134 (L) 01/02/2022   K 3.9 01/02/2022   CL 99 01/02/2022   CREATININE 1.14 01/02/2022   BUN 22 01/02/2022   CO2 27 01/02/2022   TSH 0.88 01/02/2022   PSA 1.1 12/18/2019   INR 1.1 04/04/2021   HGBA1C 6.5 01/02/2022   MICROALBUR <0.7 12/20/2019    DG Chest 2 View  Result Date: 01/02/2022 CLINICAL DATA:  Cough coronary artery bypass grafting EXAM: CHEST - 2 VIEW COMPARISON:  04/04/2021 FINDINGS: Lungs are well expanded, symmetric,  and clear. No pneumothorax or pleural effusion. Cardiac size within normal limits. Has been performed pulmonary vascularity is normal. Osseous structures are age-appropriate. No acute bone abnormality. IMPRESSION: No active cardiopulmonary disease. Electronically Signed   By: Helyn NumbersAshesh  Parikh M.D.   On: 01/02/2022 15:51     Assessment & Plan:   Leonette MostCharles was seen today for cough.  Diagnoses and all orders for this visit:  Essential hypertension- His blood pressure is adequately well controlled. -     Basic metabolic panel; Future -     CBC  with Differential/Platelet; Future -     Hepatic function panel; Future -     TSH; Future -     Urinalysis, Routine w reflex microscopic; Future -     Urinalysis, Routine w reflex microscopic -     TSH -     Hepatic function panel -     CBC with Differential/Platelet -     Basic metabolic panel  Moderate persistent asthma without complication  Type 2 diabetes mellitus with complication, without long-term current use of insulin (HCC)- His blood sugar is adequately well controlled. -     Hemoglobin A1c; Future -     Hemoglobin A1c  Dietary folate deficiency anemia- His folate level is normal now. -     CBC with Differential/Platelet; Future -     Folate; Future -     Folate -     CBC with Differential/Platelet  Thiamine deficiency- Will restart the thiamine supplement. -     CBC with Differential/Platelet; Future -     Vitamin B1; Future -     Vitamin B1 -     CBC with Differential/Platelet -     thiamine 100 MG tablet; Take 1 tablet (100 mg total) by mouth daily.  Low libido- His testosterone level is low.  Will treat if he wants to. -     Testosterone Total,Free,Bio, Males; Future -     Testosterone Total,Free,Bio, Males  Subacute cough- Chest x-ray is negative for mass or infiltrate. -     DG Chest 2 View; Future  Moderate persistent asthma with acute exacerbation -     methylPREDNISolone (MEDROL DOSEPAK) 4 MG TBPK tablet; TAKE AS  DIRECTED  Acute bronchitis due to COVID-19 virus -     HYDROcodone bit-homatropine (HYCODAN) 5-1.5 MG/5ML syrup; Take 5 mLs by mouth every 8 (eight) hours as needed for up to 8 days for cough.   I have discontinued Janetta Hora. Bolender's cefdinir. I am also having him start on methylPREDNISolone, HYDROcodone bit-homatropine, and thiamine. Additionally, I am having him maintain his hydrOXYzine, triamcinolone ointment, tacrolimus, brimonidine, omeprazole, albuterol, Aspirin Childrens, rosuvastatin, losartan, Trelegy Ellipta, and carvedilol.  Meds ordered this encounter  Medications   methylPREDNISolone (MEDROL DOSEPAK) 4 MG TBPK tablet    Sig: TAKE AS DIRECTED    Dispense:  21 tablet    Refill:  0   HYDROcodone bit-homatropine (HYCODAN) 5-1.5 MG/5ML syrup    Sig: Take 5 mLs by mouth every 8 (eight) hours as needed for up to 8 days for cough.    Dispense:  120 mL    Refill:  0   thiamine 100 MG tablet    Sig: Take 1 tablet (100 mg total) by mouth daily.    Dispense:  90 tablet    Refill:  1     Follow-up: Return in about 3 weeks (around 01/23/2022).  Sanda Linger, MD

## 2022-01-03 ENCOUNTER — Encounter: Payer: Self-pay | Admitting: Internal Medicine

## 2022-01-06 LAB — URINALYSIS, ROUTINE W REFLEX MICROSCOPIC
Bilirubin Urine: NEGATIVE
Hgb urine dipstick: NEGATIVE
Leukocytes,Ua: NEGATIVE
Nitrite: NEGATIVE
RBC / HPF: NONE SEEN (ref 0–?)
Specific Gravity, Urine: 1.015 (ref 1.000–1.030)
Urine Glucose: NEGATIVE
Urobilinogen, UA: >=8 — AB (ref 0.0–1.0)
pH: 6.5 (ref 5.0–8.0)

## 2022-01-07 LAB — TESTOSTERONE TOTAL,FREE,BIO, MALES
Albumin: 4.3 g/dL (ref 3.6–5.1)
Sex Hormone Binding: 63 nmol/L (ref 22–77)
Testosterone: 192 ng/dL — ABNORMAL LOW (ref 250–827)

## 2022-01-07 LAB — VITAMIN B1: Vitamin B1 (Thiamine): <6 nmol/L — ABNORMAL LOW (ref 8–30)

## 2022-01-07 MED ORDER — THIAMINE HCL 100 MG PO TABS: 100.0000 mg | ORAL_TABLET | Freq: Every day | ORAL | 1 refills | Status: DC

## 2022-01-13 DIAGNOSIS — H401132 Primary open-angle glaucoma, bilateral, moderate stage: Secondary | ICD-10-CM | POA: Diagnosis not present

## 2022-01-13 DIAGNOSIS — H43813 Vitreous degeneration, bilateral: Secondary | ICD-10-CM | POA: Diagnosis not present

## 2022-01-13 DIAGNOSIS — H26491 Other secondary cataract, right eye: Secondary | ICD-10-CM | POA: Diagnosis not present

## 2022-01-13 LAB — HM DIABETES EYE EXAM

## 2022-01-14 ENCOUNTER — Ambulatory Visit: Payer: BLUE CROSS/BLUE SHIELD | Admitting: Cardiology

## 2022-01-16 NOTE — Progress Notes (Incomplete)
[facility] - Clinic Note  01/19/2022     CHIEF COMPLAINT Patient presents for No chief complaint on file.   HISTORY OF PRESENT ILLNESS: Bradley Watson is a 61 y.o. male who presents to the clinic today for:    pt states he had cataract sx on his right eye on October 22 with Dr. Charlotte Sanes, pt states he has been sick with respiratory issues since his last visit here, he has tested negative for covid twice   Referring physician: [name], MD [address] [place],  [place] [address]  HISTORICAL INFORMATION:   Selected notes from the MEDICAL RECORD NUMBER Referred by Dr. Maris Berger for concern of retinal tear OS LEE: 06.10.20 (C. [facility]) [BCVA: OD: 20/40 OS: 20/25] Ocular Hx- PMH-glaucoma suspect, lattice OU, NS OU, myopia, h/o RK OU   CURRENT MEDICATIONS: Current Outpatient Medications (Ophthalmic Drugs)  Medication Sig   brimonidine (ALPHAGAN) 0.2 % ophthalmic solution Place 1 drop into both eyes 3 (three) times daily. (Patient taking differently: Place 1 drop into both eyes 2 (two) times daily.)   No current facility-administered medications for this visit. (Ophthalmic Drugs)   Current Outpatient Medications (Other)  Medication Sig   albuterol (VENTOLIN HFA) 108 (90 Base) MCG/ACT inhaler Inhale 2 puffs into the lungs every 6 (six) hours as needed for wheezing or shortness of breath.   ASPIRIN CHILDRENS 81 MG chewable tablet Chew by mouth.   carvedilol (COREG) 3.125 MG tablet Take 1 tablet (3.125 mg total) by mouth 2 (two) times daily.   hydrOXYzine (ATARAX/VISTARIL) 10 MG tablet Take 30-60 mg by mouth daily as needed for itching.    losartan (COZAAR) 25 MG tablet Take 1 tablet (25 mg total) by mouth daily.   omeprazole (PRILOSEC) 20 MG capsule Take 2 tabs daily 30 minutes before a meal for 2 weeks and then 1 tab daily 30 minutes before a meal   rosuvastatin (CRESTOR) 40 MG tablet Take 1 tablet (40 mg total) by mouth daily.   tacrolimus  (PROTOPIC) 0.1 % ointment APP EXT TO THE SKIN BID   thiamine 100 MG tablet Take 1 tablet (100 mg total) by mouth daily.   TRELEGY ELLIPTA 100-62.5-25 MCG/ACT AEPB Inhale 1 puff into the lungs daily.   triamcinolone ointment (KENALOG) 0.1 % APPLY ON THE SKIN TWICE A DAY AS NEEDED   No current facility-administered medications for this visit. (Other)      REVIEW OF SYSTEMS:     ALLERGIES Allergies  Allergen Reactions   Penicillins Anxiety and Other (See Comments)    "Made me feel like gravity had increased and it was pulling me down" (10/27/2012) Did it involve swelling of the face/tongue/throat, SOB, or low BP? No Did it involve sudden or severe rash/hives, skin peeling, or any reaction on the inside of your mouth or nose? No Did you need to seek medical attention at a hospital or doctor's office? No When did it last happen? Over 10 years ago If all above answers are NO, may proceed with cephalosporin use.   Lisinopril Cough    PAST MEDICAL HISTORY Past Medical History:  Diagnosis Date   Carotid stenosis    Pre-CABG Dopplers: RICA 40-59%;  Carotid US (05/2014):  R 1-39%; normal LICA   Cataract    OU   Childhood asthma    Cluster headaches 1985   Mid 80's   Coronary artery disease    a.  LHC 10/27/12 demonstrated pLAD 70%, AV CFX occluded, pOM  80%, dCFX collateralized the LAD and RCA, RCA without obstructive lesions, EF 50% with inferobasal HK;  b.  Echo 10/30/12: EF 55-60%, grade 1 diastolic dysfunction, mild LAE, normal LV wall motion;  c. s/p CABG 10/31/12 with Dr. Dorris Fetch:  L-LAD, left radial-OM;   b.  ETT-Nuc (05/2014):  + ECG changes, no ischemia; low risk   Diverticulitis 02-2012   CT scan    Fatty liver 02-2012   Mild- CT scan    GERD (gastroesophageal reflux disease)    HLD (hyperlipidemia)    Iron deficiency anemia 1980's   Type II diabetes mellitus with manifestations [facility])    Past Surgical History:  Procedure Laterality Date   CARDIAC CATHETERIZATION   10/27/2012   cataract  2020   CORONARY ARTERY BYPASS GRAFT  10/31/2012   Procedure: CORONARY ARTERY BYPASS GRAFTING (CABG);  Surgeon: Loreli Slot, MD;  Location: [facility] OR;  Service: Open Heart Surgery;  Laterality: N/A;   LEFT HEART CATHETERIZATION WITH CORONARY ANGIOGRAM N/A 10/27/2012   Procedure: LEFT HEART CATHETERIZATION WITH CORONARY ANGIOGRAM;  Surgeon: Herby Abraham, MD;  Location: [facility] CATH LAB;  Service: Cardiovascular;  Laterality: N/A;   RADIAL ARTERY HARVEST  10/31/2012   Procedure: RADIAL ARTERY HARVEST;  Surgeon: Loreli Slot, MD;  Location: [facility] OR;  Service: Open Heart Surgery;  Laterality: Left;   RADIAL KERATOTOMY  1985   bilaterally    FAMILY HISTORY Family History  Problem Relation Age of Onset   Other Mother        car accident    Lung cancer Father    Diabetes Mellitus II Paternal Grandmother     SOCIAL HISTORY Social History   Tobacco Use   Smoking status: Former    Packs/day: 0.12    Years: 1.50    Pack years: 0.18    Types: Cigarettes   Smokeless tobacco: Never  Vaping Use   Vaping Use: Never used  Substance Use Topics   Alcohol use: Yes    Comment: weekly   Drug use: Not Currently         OPHTHALMIC EXAM:   Not recorded     IMAGING AND PROCEDURES  Imaging and Procedures for @TODAY @           ASSESSMENT/PLAN:    ICD-10-CM   1. Retinal tear of both eyes  H33.313     2. 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 Vitreous hemorrhage of both eyes (HCC)  H43.13     5. 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 History of radial keratotomy  Z98.890     7. Combined forms of age-related cataract of left eye  H25.812     8. Pseudophakia  Z96.1     9. Glaucoma suspect of both eyes  H40.003        1-4. Retinal tears with focal retinal detachments and vitreous hemorrhage OU          Lattice degeneration w/  atrophic holes OU  - s/p laser retinopexy OD (6.26.20) to large superonasal HST and superior patches of lattice, fill-in (07.01.20) to HST at 1045, fill-in (08.07.20) -- good laser surrounding all lesions  - s/p laser retinopexy OS (06.10.20) and touch up laser with Dr. 08.10.20 6.19.2020 -- good laser surrounding lesions OS (HST at 0130 with cuff of +SRF and +lattice  within flap, scattered patches of lattice degeneration)  - both eyes with diffuse vitreous hemorrhages 2/2 RTs w/ bridging vessels, vitreous hemes improving OU  - s/p cryopexy OD (08.21.20) to cauterize bridging vessels OD -- good cryo changes in place and bridging vessels sclerosed  - all retinal lesions now well treated with laser and cryo -- now just residual VH and vitreous floaters limiting vision OD -- cleared for f/u with Dr. Charlotte SanesMcCuen  - VH improving - discussed importance of VH precautions  -- minimize activities, keep head elevated, avoid ASA/NSAIDs/blood thinners as able  - f/u 6-8 weeks  5,6. History of high myopia s/p 8-cut Radial Keratotomy OU  - discussed increased risk of lattice degeneration, RT, RD in high myopes  - stable  - monitor  7. 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  - discussed diagnosis and progression  - under the expert management of Dr. Charlotte SanesMcCuen   - clear from a retinal standpoint to proceed with cataract surgery when both patient and surgeon are ready  8. Pseudophakia OD  - s/p CE/IOL (October 2020, [facility])  - beautiful surgery, doing well  - monitor  9. Glaucoma Suspect OU  - IOP 18 OU  - +cupping  - +family history of glaucoma  - expert management per Dr. Charlotte SanesMcCuen  - continue brimonidine TID OU and cosopt BID OU   Ophthalmic Meds Ordered this visit:  No orders of the defined types were placed in this encounter.      No follow-ups on file.  There are no Patient Instructions on file for  this visit.   Explained the diagnoses, plan, and follow up with the patient and they expressed understanding.  Patient expressed understanding of the importance of proper follow up care.   This document serves as a record of services personally performed by Karie ChimeraBrian G. Zamora, MD, PhD. It was created on their behalf by Annalee Gentaaryl Tyrek Lawhorn, COMT. The creation of this record is the provider's dictation and/or activities during the visit.  Electronically signed by: Annalee Gentaaryl Almer Littleton, COMT 01/16/22 2:00 PM    Karie ChimeraBrian G. Zamora, M.D., Ph.D. Diseases & Surgery of the Retina and Vitreous [facility] 11/15/2019      Abbreviations: M myopia (nearsighted); A astigmatism; H hyperopia (farsighted); P presbyopia; Mrx spectacle prescription;  CTL contact lenses; OD right eye; OS left eye; OU both eyes  XT exotropia; ET esotropia; PEK punctate epithelial keratitis; PEE punctate epithelial erosions; DES dry eye syndrome; MGD meibomian gland dysfunction; ATs artificial tears; PFAT's preservative free artificial tears; NSC nuclear sclerotic cataract; PSC posterior subcapsular cataract; ERM epi-retinal membrane; PVD posterior vitreous detachment; RD retinal detachment; DM diabetes mellitus; DR diabetic retinopathy; NPDR non-proliferative diabetic retinopathy; PDR proliferative diabetic retinopathy; CSME clinically significant macular edema; DME diabetic macular edema; dbh dot blot hemorrhages; CWS cotton wool spot; POAG primary open angle glaucoma; C/D cup-to-disc ratio; HVF humphrey visual field; GVF goldmann visual field; OCT optical coherence tomography; IOP intraocular pressure; BRVO Branch retinal vein occlusion; CRVO central retinal vein occlusion; CRAO central retinal artery occlusion; BRAO branch retinal artery occlusion; RT retinal tear; SB scleral buckle; PPV pars plana vitrectomy; VH Vitreous hemorrhage; PRP panretinal laser photocoagulation; IVK intravitreal kenalog; VMT vitreomacular traction; MH  Macular hole;  NVD neovascularization of the disc; NVE neovascularization elsewhere; AREDS age related eye disease study; ARMD age related macular degeneration; POAG primary open angle glaucoma; EBMD epithelial/anterior basement membrane dystrophy;  ACIOL anterior chamber intraocular lens; IOL intraocular lens; PCIOL posterior chamber intraocular lens; Phaco/IOL phacoemulsification with intraocular lens placement; [place] photorefractive keratectomy; LASIK laser assisted in situ keratomileusis; HTN hypertension; DM diabetes mellitus; COPD chronic obstructive pulmonary disease

## 2022-01-19 ENCOUNTER — Ambulatory Visit (INDEPENDENT_AMBULATORY_CARE_PROVIDER_SITE_OTHER): Payer: BLUE CROSS/BLUE SHIELD | Admitting: Ophthalmology

## 2022-01-19 ENCOUNTER — Ambulatory Visit: Payer: BLUE CROSS/BLUE SHIELD | Admitting: Internal Medicine

## 2022-01-19 ENCOUNTER — Encounter: Payer: Self-pay | Admitting: Internal Medicine

## 2022-01-19 ENCOUNTER — Encounter (INDEPENDENT_AMBULATORY_CARE_PROVIDER_SITE_OTHER): Payer: Self-pay | Admitting: Ophthalmology

## 2022-01-19 ENCOUNTER — Other Ambulatory Visit: Payer: Self-pay

## 2022-01-19 VITALS — BP 128/78 | HR 61 | Temp 98.4°F | Resp 16 | Ht 72.0 in | Wt 220.0 lb

## 2022-01-19 DIAGNOSIS — E785 Hyperlipidemia, unspecified: Secondary | ICD-10-CM | POA: Diagnosis not present

## 2022-01-19 DIAGNOSIS — H40003 Preglaucoma, unspecified, bilateral: Secondary | ICD-10-CM

## 2022-01-19 DIAGNOSIS — Z9889 Other specified postprocedural states: Secondary | ICD-10-CM

## 2022-01-19 DIAGNOSIS — Z0000 Encounter for general adult medical examination without abnormal findings: Secondary | ICD-10-CM

## 2022-01-19 DIAGNOSIS — I10 Essential (primary) hypertension: Secondary | ICD-10-CM

## 2022-01-19 DIAGNOSIS — E291 Testicular hypofunction: Secondary | ICD-10-CM | POA: Diagnosis not present

## 2022-01-19 DIAGNOSIS — H4313 Vitreous hemorrhage, bilateral: Secondary | ICD-10-CM

## 2022-01-19 DIAGNOSIS — H35413 Lattice degeneration of retina, bilateral: Secondary | ICD-10-CM

## 2022-01-19 DIAGNOSIS — H524 Presbyopia: Secondary | ICD-10-CM

## 2022-01-19 DIAGNOSIS — Z961 Presence of intraocular lens: Secondary | ICD-10-CM

## 2022-01-19 DIAGNOSIS — H3323 Serous retinal detachment, bilateral: Secondary | ICD-10-CM

## 2022-01-19 DIAGNOSIS — Z23 Encounter for immunization: Secondary | ICD-10-CM

## 2022-01-19 DIAGNOSIS — H33313 Horseshoe tear of retina without detachment, bilateral: Secondary | ICD-10-CM | POA: Diagnosis not present

## 2022-01-19 DIAGNOSIS — E118 Type 2 diabetes mellitus with unspecified complications: Secondary | ICD-10-CM | POA: Diagnosis not present

## 2022-01-19 DIAGNOSIS — H25812 Combined forms of age-related cataract, left eye: Secondary | ICD-10-CM

## 2022-01-19 DIAGNOSIS — H26491 Other secondary cataract, right eye: Secondary | ICD-10-CM

## 2022-01-19 DIAGNOSIS — H5213 Myopia, bilateral: Secondary | ICD-10-CM

## 2022-01-19 LAB — MICROALBUMIN / CREATININE URINE RATIO
Creatinine,U: 175.1 mg/dL
Microalb Creat Ratio: 0.4 mg/g (ref 0.0–30.0)
Microalb, Ur: <0.7 mg/dL (ref 0.0–1.9)

## 2022-01-19 LAB — FOLLICLE STIMULATING HORMONE: FSH: 46.4 m[IU]/mL — ABNORMAL HIGH (ref 1.4–18.1)

## 2022-01-19 LAB — LIPID PANEL
Cholesterol: 184 mg/dL (ref 0–200)
HDL: 41.4 mg/dL (ref >39.00)
LDL Cholesterol: 128 mg/dL — ABNORMAL HIGH (ref 0–99)
NonHDL: 142.18
Total CHOL/HDL Ratio: 4
Triglycerides: 71 mg/dL (ref 0.0–149.0)
VLDL: 14.2 mg/dL (ref 0.0–40.0)

## 2022-01-19 LAB — LUTEINIZING HORMONE: LH: 27.61 m[IU]/mL — ABNORMAL HIGH (ref 1.50–9.30)

## 2022-01-19 LAB — PSA: PSA: 0.29 ng/mL (ref 0.10–4.00)

## 2022-01-19 MED ORDER — ROSUVASTATIN CALCIUM 40 MG PO TABS: 40.0000 mg | ORAL_TABLET | Freq: Every day | ORAL | 1 refills | Status: DC

## 2022-01-19 NOTE — Patient Instructions (Signed)
Type 2 Diabetes Mellitus, Diagnosis, Adult ?Type 2 diabetes (type 2 diabetes mellitus) is a long-term, or chronic, disease. In type 2 diabetes, one or both of these problems may be present: ?The pancreas does not make enough of a hormone called insulin. ?Cells in the body do not respond properly to the insulin that the body makes (insulin resistance). ?Normally, insulin allows blood sugar (glucose) to enter cells in the body. The cells use glucose for energy. Insulin resistance or lack of insulin causes excess glucose to build up in the blood instead of going into cells. This causes high blood glucose (hyperglycemia).  ?What are the causes? ?The exact cause of type 2 diabetes is not known. ?What increases the risk? ?The following factors may make you more likely to develop this condition: ?Having a family member with type 2 diabetes. ?Being overweight or obese. ?Being inactive (sedentary). ?Having been diagnosed with insulin resistance. ?Having a history of prediabetes, diabetes when you were pregnant (gestational diabetes), or polycystic ovary syndrome (PCOS). ?What are the signs or symptoms? ?In the early stage of this condition, you may not have symptoms. Symptoms develop slowly and may include: ?Increased thirst or hunger. ?Increased urination. ?Unexplained weight loss. ?Tiredness (fatigue) or weakness. ?Vision changes, such as blurry vision. ?Dark patches on the skin. ?How is this diagnosed? ?This condition is diagnosed based on your symptoms, your medical history, a physical exam, and your blood glucose level. Your blood glucose may be checked with one or more of the following blood tests: ?A fasting blood glucose (FBG) test. You will not be allowed to eat (you will fast) for 8 hours or longer before a blood sample is taken. ?A random blood glucose test. This test checks blood glucose at any time of day regardless of when you ate. ?An A1C (hemoglobin A1C) blood test. This test provides information about blood  glucose levels over the previous 2-3 months. ?An oral glucose tolerance test (OGTT). This test measures your blood glucose at two times: ?After fasting. This is your baseline blood glucose level. ?Two hours after drinking a beverage that contains glucose. ?You may be diagnosed with type 2 diabetes if: ?Your fasting blood glucose level is 126 mg/dL (7.0 mmol/L) or higher. ?Your random blood glucose level is 200 mg/dL (11.1 mmol/L) or higher. ?Your A1C level is 6.5% or higher. ?Your oral glucose tolerance test result is higher than 200 mg/dL (11.1 mmol/L). ?These blood tests may be repeated to confirm your diagnosis. ?How is this treated? ?Your treatment may be managed by a specialist called an endocrinologist. Type 2 diabetes may be treated by following instructions from your health care provider about: ?Making dietary and lifestyle changes. These may include: ?Following a personalized nutrition plan that is developed by a registered dietitian. ?Exercising regularly. ?Finding ways to manage stress. ?Checking your blood glucose level as often as told. ?Taking diabetes medicines or insulin daily. This helps to keep your blood glucose levels in the healthy range. ?Taking medicines to help prevent complications from diabetes. Medicines may include: ?Aspirin. ?Medicine to lower cholesterol. ?Medicine to control blood pressure. ?Your health care provider will set treatment goals for you. Your goals will be based on your age, other medical conditions you have, and how you respond to diabetes treatment. Generally, the goal of treatment is to maintain the following blood glucose levels: ?Before meals: 80-130 mg/dL (4.4-7.2 mmol/L). ?After meals: below 180 mg/dL (10 mmol/L). ?A1C level: less than 7%. ?Follow these instructions at home: ?Questions to ask your health care provider ?  Consider asking the following questions: ?Should I meet with a certified diabetes care and education specialist? ?What diabetes medicines do I need,  and when should I take them? ?What equipment will I need to manage my diabetes at home? ?How often do I need to check my blood glucose? ?Where can I find a support group for people with diabetes? ?What number can I call if I have questions? ?When is my next appointment? ?General instructions ?Take over-the-counter and prescription medicines only as told by your health care provider. ?Keep all follow-up visits. This is important. ?Where to find more information ?For help and guidance and for more information about diabetes, please visit: ?American Diabetes Association (ADA): www.diabetes.org ?American Association of Diabetes Care and Education Specialists (ADCES): www.diabeteseducator.org ?International Diabetes Federation (IDF): www.idf.org ?Contact a health care provider if: ?Your blood glucose is at or above 240 mg/dL (13.3 mmol/L) for 2 days in a row. ?You have been sick or have had a fever for 2 days or longer, and you are not getting better. ?You have any of the following problems for more than 6 hours: ?You cannot eat or drink. ?You have nausea and vomiting. ?You have diarrhea. ?Get help right away if: ?You have severe hypoglycemia. This means your blood glucose is lower than 54 mg/dL (3.0 mmol/L). ?You become confused or you have trouble thinking clearly. ?You have difficulty breathing. ?You have moderate or large ketone levels in your urine. ?These symptoms may represent a serious problem that is an emergency. Do not wait to see if the symptoms will go away. Get medical help right away. Call your local emergency services (911 in the [place] Do not drive yourself to the hospital. ?Summary ?Type 2 diabetes mellitus is a long-term, or chronic, disease. In type 2 diabetes, the pancreas does not make enough of a hormone called insulin, or cells in the body do not respond properly to insulin that the body makes. ?This condition is treated by making dietary and lifestyle changes and taking diabetes medicines or  insulin. ?Your health care provider will set treatment goals for you. Your goals will be based on your age, other medical conditions you have, and how you respond to diabetes treatment. ?Keep all follow-up visits. This is important. ?This information is not intended to replace advice given to you by your health care provider. Make sure you discuss any questions you have with your health care provider. ?Document Revised: 03/03/2021 Document Reviewed: 03/03/2021 ?[employer] Patient Education ? 2022 [employer] ? ?

## 2022-01-19 NOTE — Progress Notes (Signed)
[facility] - Clinic Note  01/19/2022     CHIEF COMPLAINT Patient presents for Retina Evaluation   HISTORY OF PRESENT ILLNESS: Bradley Watson is a 61 y.o. male who presents to the clinic today for:   HPI     Retina Evaluation   In both eyes.  This started 1 week ago.  Duration of 1 week.  Associated Symptoms Floaters.  I, the attending physician,  performed the HPI with the patient and updated documentation appropriately.        Comments   Pt referred by [facility], Dr. Charlotte Sanes, for retinal clearance. Pt states he has a hx of not seeing as well out of OD. Has had cataract sx OU. Pt was told there was a clouded lens in his eye that may need an additional surgery and is needing retinal eval. Pt states there are far more floaters in OD than there used to be, but hes used to having floaters OU.       Last edited by Rennis Chris, MD on 01/19/2022  2:12 PM.     pt is here on the referral of Dr. Charlotte Sanes for retinal clearance for yag, he saw her last week and she noticed there were a lot more floaters in his right eye than there were in his left eye, she also noticed a film on the back of his cataract lens, pt states Dr. Charlotte Sanes wanted Dr. Laban Emperor advice on what to do   Referring physician: Maris Berger, MD [address] [place],  [place] [address]  HISTORICAL INFORMATION:   Selected notes from the MEDICAL RECORD NUMBER Referred by Dr. Maris Berger for concern of retinal tear OS LEE: 06.10.20 (C. [facility]) [BCVA: OD: 20/40 OS: 20/25] Ocular Hx- PMH-glaucoma suspect, lattice OU, NS OU, myopia, h/o RK OU   CURRENT MEDICATIONS: Current Outpatient Medications (Ophthalmic Drugs)  Medication Sig   brimonidine (ALPHAGAN) 0.2 % ophthalmic solution Place 1 drop into both eyes 3 (three) times daily. (Patient taking differently: Place 1 drop into both eyes 2 (two) times daily.)   dorzolamide (TRUSOPT) 2 % ophthalmic solution Place 1 drop into both eyes  daily.   No current facility-administered medications for this visit. (Ophthalmic Drugs)   Current Outpatient Medications (Other)  Medication Sig   albuterol (VENTOLIN HFA) 108 (90 Base) MCG/ACT inhaler Inhale 2 puffs into the lungs every 6 (six) hours as needed for wheezing or shortness of breath.   ASPIRIN CHILDRENS 81 MG chewable tablet Chew by mouth.   carvedilol (COREG) 3.125 MG tablet Take 1 tablet (3.125 mg total) by mouth 2 (two) times daily.   omeprazole (PRILOSEC) 20 MG capsule Take 2 tabs daily 30 minutes before a meal for 2 weeks and then 1 tab daily 30 minutes before a meal   tacrolimus (PROTOPIC) 0.1 % ointment APP EXT TO THE SKIN BID   thiamine 100 MG tablet Take 1 tablet (100 mg total) by mouth daily.   TRELEGY ELLIPTA 100-62.5-25 MCG/ACT AEPB Inhale 1 puff into the lungs daily.   losartan (COZAAR) 25 MG tablet Take 1 tablet (25 mg total) by mouth daily.   rosuvastatin (CRESTOR) 40 MG tablet Take 1 tablet (40 mg total) by mouth daily.   triamcinolone ointment (KENALOG) 0.1 % APPLY ON THE SKIN TWICE A DAY AS NEEDED (Patient not taking: Reported on 01/19/2022)   No current facility-administered medications for this visit. (Other)   REVIEW OF SYSTEMS: ROS   Positive for: Gastrointestinal, Cardiovascular, Eyes Negative for:  Constitutional, Neurological, Skin, Genitourinary, Musculoskeletal, HENT, Endocrine, Respiratory, Psychiatric, Allergic/Imm, Heme/Lymph Last edited by Thompson Grayer, COT on 01/19/2022  1:20 PM.     ALLERGIES Allergies  Allergen Reactions   Penicillins Anxiety and Other (See Comments)    "Made me feel like gravity had increased and it was pulling me down" (10/27/2012) Did it involve swelling of the face/tongue/throat, SOB, or low BP? No Did it involve sudden or severe rash/hives, skin peeling, or any reaction on the inside of your mouth or nose? No Did you need to seek medical attention at a hospital or doctor's office? No When did it last happen?  Over 10 years ago If all above answers are NO, may proceed with cephalosporin use.   Lisinopril Cough    PAST MEDICAL HISTORY Past Medical History:  Diagnosis Date   Carotid stenosis    Pre-CABG Dopplers: RICA 40-59%;  Carotid US (05/2014):  R 1-39%; normal LICA   Cataract    OU   Childhood asthma    Cluster headaches 1985   Mid 80's   Coronary artery disease    a.  LHC 10/27/12 demonstrated pLAD 70%, AV CFX occluded, pOM 80%, dCFX collateralized the LAD and RCA, RCA without obstructive lesions, EF 50% with inferobasal HK;  b.  Echo 10/30/12: EF 55-60%, grade 1 diastolic dysfunction, mild LAE, normal LV wall motion;  c. s/p CABG 10/31/12 with Dr. Dorris Fetch:  L-LAD, left radial-OM;   b.  ETT-Nuc (05/2014):  + ECG changes, no ischemia; low risk   Diverticulitis 02-2012   CT scan    Fatty liver 02-2012   Mild- CT scan    GERD (gastroesophageal reflux disease)    HLD (hyperlipidemia)    Iron deficiency anemia 1980's   Type II diabetes mellitus with manifestations [facility])    Past Surgical History:  Procedure Laterality Date   CARDIAC CATHETERIZATION  10/27/2012   cataract  2020   CORONARY ARTERY BYPASS GRAFT  10/31/2012   Procedure: CORONARY ARTERY BYPASS GRAFTING (CABG);  Surgeon: Loreli Slot, MD;  Location: [facility] OR;  Service: Open Heart Surgery;  Laterality: N/A;   LEFT HEART CATHETERIZATION WITH CORONARY ANGIOGRAM N/A 10/27/2012   Procedure: LEFT HEART CATHETERIZATION WITH CORONARY ANGIOGRAM;  Surgeon: Herby Abraham, MD;  Location: [facility] CATH LAB;  Service: Cardiovascular;  Laterality: N/A;   RADIAL ARTERY HARVEST  10/31/2012   Procedure: RADIAL ARTERY HARVEST;  Surgeon: Loreli Slot, MD;  Location: [facility] OR;  Service: Open Heart Surgery;  Laterality: Left;   RADIAL KERATOTOMY  1985   bilaterally   FAMILY HISTORY Family History  Problem Relation Age of Onset   Glaucoma Mother    Other Mother        car accident    Lung cancer Father    Glaucoma Paternal  Grandmother    Diabetes Mellitus II Paternal Grandmother    SOCIAL HISTORY Social History   Tobacco Use   Smoking status: Former    Packs/day: 0.12    Years: 1.50    Pack years: 0.18    Types: Cigarettes   Smokeless tobacco: Never  Vaping Use   Vaping Use: Never used  Substance Use Topics   Alcohol use: Not Currently    Comment: weekly   Drug use: Not Currently       OPHTHALMIC EXAM:  Base Eye Exam     Visual Acuity (Snellen - Linear)       Right Left   Dist cc 20/30 +2 20/25 +2  Dist ph cc NI 20/20    Correction: Glasses         Tonometry (Tonopen, 1:42 PM)       Right Left   Pressure 15 15         Pupils       Dark Light Shape React APD   Right 3 2 Round Brisk None   Left 3 2 Round Brisk None         Visual Fields (Counting fingers)       Left Right    Full Full         Extraocular Movement       Right Left    Full, Ortho Full, Ortho         Neuro/Psych     Oriented x3: Yes   Mood/Affect: Normal         Dilation     Both eyes: 1.0% Mydriacyl, 2.5% Phenylephrine @ 1:43 PM           Slit Lamp and Fundus Exam     Slit Lamp Exam       Right Left   Lids/Lashes Dermatochalasis - upper lid Dermatochalasis - upper lid   Conjunctiva/Sclera mild melanosis Melanosis, mild nasal Pinguecula   Cornea 8 RK scars, mild Arcus, well healed temporal cataract wounds, trace tear film debris 8 RK scars, mild Arcus   Anterior Chamber Deep and clear deep, clear, narrow temporal angle   Iris Round and dilated Round and moderately dilated   Lens PC IOL in good position, 1-2+ Posterior capsular opacification 2-3+ Nuclear sclerosis with mild brunescence, 2-3+ Cortical cataract   Anterior Vitreous Vitreous syneresis, +pigment, Posterior vitreous detachment, vitreous condensations centrally, old VH - improved, just mild vitreous debris settled inferiorly Vitreous syneresis, +pigment, Posterior vitreous detachment, vitreous condensations  centrally, red VH settling inferiorly         Fundus Exam       Right Left   Disc Sharp rim, +cupping, mild pallor, Peripapillary atrophy, thin superior rim Pink, sharp.  +cupping, thin superior rim   C/D Ratio 0.8 0.7   Macula flat, good foveal reflex, trace Epiretinal membrane, RPE mottling and clumping, No heme or edema flat, good foveal reflex, mild RPE mottling and clumping, trace ERM, no heme or edema   Vessels attenuated, Tortuous Vascular attenuation, Copper wiring   Periphery Attached, pigmented lattice at 1200, 0100; HST with bridging vessel 0130-0200 -- good cryo surrounding, bridging vessels sclerosed; pigmented paving stone from 0600-0730, pigmented CR scar and lattice at 1030, small HST at 1045 -- good laser surrounding all lesions, no new RT/RD/lattice Retinal break at 1200, HST from 0100-0130 with cuff of +SRF and +lattice within flap, round holes at 0200 with +SRF, pigmented CR scar at 0200 just anterior to holes, pigmented paving stone inferiorly -- good laser surrounding all lesions, no new RT/RD/lattice           Refraction     Wearing Rx       Sphere Cylinder Axis Add   Right -3.00 +0.50 173 +2.50   Left -5.75 +1.50 041 +2.50  Rx specs 2-3 months old         Manifest Refraction       Sphere Cylinder Axis Dist VA   Right -3.25 +0.75 175 20/30+1   Left -4.50 +1.50 050 20/25/-1            IMAGING AND PROCEDURES  Imaging and Procedures for @TODAY @  OCT, Retina - OU - Both Eyes  Right Eye Quality was borderline. Central Foveal Thickness: 196. Progression has been stable. Findings include normal foveal contour, no SRF, no IRF, myopic contour (Mild persistent vitreous opacities).   Left Eye Quality was good. Central Foveal Thickness: 267. Progression has been stable. Findings include normal foveal contour, no IRF, no SRF, myopic contour (Stable improvement in vitreous opacities).   Notes *Images captured and stored on drive  Diagnosis /  Impression:  NFP, no IRF/SRF OU Myopic contour OU OD: Mild persistent vitreous opacities OS: Stable improvement in vitreous opacities  Clinical management:  See below  Abbreviations: NFP - Normal foveal profile. CME - cystoid macular edema. PED - pigment epithelial detachment. IRF - intraretinal fluid. SRF - subretinal fluid. EZ - ellipsoid zone. ERM - epiretinal membrane. ORA - outer retinal atrophy. ORT - outer retinal tubulation. SRHM - subretinal hyper-reflective material      HM DIABETES EYE EXAM      Component Value Flag Ref Range Units Status   HM Diabetic Eye Exam No Retinopathy      No Retinopathy  Final           Prolactin      Component Value Flag Ref Range Units Status   Prolactin 10.2      2.0 - 18.0 ng/mL Final           Follicle stimulating hormone      Component Value Flag Ref Range Units Status   FSH 46.4      1.4 - 18.1 mIU/ML Final   Comment:   Male Reference Range:  1.4-18.1 mIU/mLFemale Reference Range:Follicular Phase          2.5-10.2 mIU/mLMidCycle Peak          3.4-33.4 mIU/mLLuteal Phase          1.5-9.1 mIU/mLPost Menopausal     23.0-116.3 mIU/mLPregnant          <0.3 mIU/mL           Luteinizing hormone      Component Value Flag Ref Range Units Status   LH 27.61      1.50 - 9.30 mIU/mL Final   Comment:   Male Reference Range:20-70 yrs     1.5-9.3 mIU/mL>70 yrs       3.1-35.6 mIU/mLFemale Reference Range:Follicular Phase     1.9-12.5 mIU/mLMidcycle             8.7-76.3 mIU/mLLuteal Phase         0.5-16.9 mIU/mL  Post Menopausal      15.9-54.0  mIU/mLPregnant             <1.5 mIU/mLContraceptives       0.7-5.6 mIU/mL            PSA      Component Value Flag Ref Range Units Status   PSA 0.29      0.10 - 4.00 ng/mL Final   Comment:   Test performed using Access Hybritech PSA Assay, a parmagnetic partical, chemiluminecent immunoassay.           Microalbumin / creatinine urine ratio      Component Value Flag Ref Range  Units Status   Microalb, Ur <0.7      0.0 - 1.9 mg/dL Final   Creatinine,U 132.4175.1       mg/dL Final   Microalb Creat Ratio 0.4      0.0 - 30.0 mg/g Final           Lipid panel      Component  Value Flag Ref Range Units Status   Cholesterol 184      0 - 200 mg/dL Final   Comment:   ATP III Classification       Desirable:  < 200 mg/dL               Borderline High:  200 - 239 mg/dL          High:  > = 956 mg/dL   Triglycerides 38.7      0.0 - 149.0 mg/dL Final   Comment:   Normal:  <150 mg/dLBorderline High:  150 - 199 mg/dL   HDL 56.43      >32.95 mg/dL Final   VLDL 18.8      0.0 - 40.0 mg/dL Final   LDL Cholesterol 128      0 - 99 mg/dL Final   Total CHOL/HDL Ratio 4        Final   Comment:                  Men          Women1/2 Average Risk     3.4          3.3Average Risk          5.0          4.42X Average Risk          9.6          7.13X Average Risk          15.0          11.0                       NonHDL 142.18        Final   Comment:   NOTE:  Non-HDL goal should be 30 mg/dL higher than patient's LDL goal (i.e. LDL goal of < 70 mg/dL, would have non-HDL goal of < 100 mg/dL)                   ASSESSMENT/PLAN:    ICD-10-CM   1. Retinal tear of both eyes  H33.313 OCT, Retina - OU - Both Eyes    2. 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 Vitreous hemorrhage of both eyes (HCC)  H43.13     5. 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 History of radial keratotomy  Z98.890     7. Combined forms of age-related cataract of left eye  H25.812     8. Pseudophakia  Z96.1     9. Glaucoma suspect of both eyes  H40.003      1-4. Retinal tears with focal retinal detachments and vitreous hemorrhage OU          Lattice degeneration w/ atrophic holes OU  - s/p laser retinopexy OD (6.26.20) to large superonasal HST and superior patches of lattice, fill-in (07.01.20) to  HST at 1045, fill-in (08.07.20) -- good laser surrounding all lesions  - s/p laser retinopexy OS (06.10.20) and touch up laser with Dr. Ashley Royalty 6.19.2020 -- good laser surrounding lesions OS (HST at 0130 with cuff of +SRF and +lattice within flap, scattered patches of lattice degeneration)  - both eyes with diffuse vitreous hemorrhages 2/2 RTs w/ bridging vessels, vitreous hemes improving OU  - s/p cryopexy  OD (08.21.20) to cauterize bridging vessels OD -- good cryo changes in place and bridging vessels sclerosed  - all retinal lesions now well treated with laser and cryo -- no new RT/RD  - VH cleared  - f/u 9-12 months, DFE, OCT  5,6. History of high myopia s/p 8-cut Radial Keratotomy OU  - discussed increased risk of lattice degeneration, RT, RD in high myopes  - stable  - monitor  7. 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  - discussed diagnosis and progression  - under the expert management of Dr. Charlotte SanesMcCuen   - clear from a retinal standpoint to proceed with cataract surgery when both patient and surgeon are ready  8,9. Pseudophakia OD  - s/p CE/IOL (October 2020, [place])  - beautiful surgery, doing well  - 1-2+ PCO -- clear from a retina standpoint to proceed with YAG cap when pt and surgeon are ready  - monitor  10. 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 Charlotte SanesMcCuen  - continue brimonidine TID OU and cosopt BID OU   Ophthalmic Meds Ordered this visit:  No orders of the defined types were placed in this encounter.      Return for f/u 9-12 months, retinal tears OU, DFE, OCT.  There are no Patient Instructions on file for this visit.   Explained the diagnoses, plan, and follow up with the patient and they expressed understanding.  Patient expressed understanding of the importance of proper follow up care.   This  document serves as a record of services personally performed by Karie ChimeraBrian G. Pascal Stiggers, MD, PhD. It was created on their behalf by Annalee Gentaaryl Barber, COMT. The creation of this record is the provider's dictation and/or activities during the visit.  Electronically signed by: Annalee Gentaaryl Barber, COMT 01/21/22 1:36 AM   This document serves as a record of services personally performed by Karie ChimeraBrian G. Tatianna Ibbotson, MD, PhD. It was created on their behalf by Glee ArvinAmanda J. Manson PasseyBrown, OA an ophthalmic technician. The creation of this record is the provider's dictation and/or activities during the visit.    Electronically signed by: Glee ArvinAmanda J. Manson PasseyBrown, [place] 01.30.2023 1:36 AM    Karie ChimeraBrian G. Arie Powell, M.D., Ph.D. Diseases & Surgery of the Retina and Vitreous [facility] 11/15/2019   I have reviewed the above documentation for accuracy and completeness, and I agree with the above. Karie ChimeraBrian G. Carynn Felling, M.D., Ph.D. 01/21/22 1:42 AM    Abbreviations: M myopia (nearsighted); A astigmatism; H hyperopia (farsighted); P presbyopia; Mrx spectacle prescription;  CTL contact lenses; OD right eye; OS left eye; OU both eyes  XT exotropia; ET esotropia; PEK punctate epithelial keratitis; PEE punctate epithelial erosions; DES dry eye syndrome; MGD meibomian gland dysfunction; ATs artificial tears; PFAT's preservative free artificial tears; NSC nuclear sclerotic cataract; PSC posterior subcapsular cataract; ERM epi-retinal membrane; PVD posterior vitreous detachment; RD retinal detachment; DM diabetes mellitus; DR diabetic retinopathy; NPDR non-proliferative diabetic retinopathy; PDR proliferative diabetic retinopathy; CSME clinically significant macular edema; DME diabetic macular edema; dbh dot blot hemorrhages; CWS cotton wool spot; POAG primary open angle glaucoma; C/D cup-to-disc ratio; HVF humphrey visual field; GVF goldmann visual field; OCT optical coherence tomography; IOP intraocular pressure; BRVO Branch retinal vein occlusion; CRVO  central retinal vein occlusion; CRAO central retinal artery occlusion; BRAO branch retinal artery occlusion; RT retinal tear; SB  scleral buckle; PPV pars plana vitrectomy; VH Vitreous hemorrhage; PRP panretinal laser photocoagulation; IVK intravitreal kenalog; VMT vitreomacular traction; MH Macular hole;  NVD neovascularization of the disc; NVE neovascularization elsewhere; AREDS age related eye disease study; ARMD age related macular degeneration; POAG primary open angle glaucoma; EBMD epithelial/anterior basement membrane dystrophy; ACIOL anterior chamber intraocular lens; IOL intraocular lens; PCIOL posterior chamber intraocular lens; Phaco/IOL phacoemulsification with intraocular lens placement; Starke photorefractive keratectomy; LASIK laser assisted in situ keratomileusis; HTN hypertension; DM diabetes mellitus; COPD chronic obstructive pulmonary disease

## 2022-01-19 NOTE — Progress Notes (Signed)
Subjective:  Patient ID: Bradley Watson, male    DOB: 04/18/1961  Age: 61 y.o. MRN: [id number]  CC: Annual Exam, Hyperlipidemia, Hypertension, and Diabetes  This visit occurred during the SARS-CoV-2 public health emergency.  Safety protocols were in place, including screening questions prior to the visit, additional usage of staff PPE, and extensive cleaning of exam room while observing appropriate contact time as indicated for disinfecting solutions.    HPI Bradley Watson presents for a CPX and f/up -  He is active and denies chest pain, shortness of breath, diaphoresis, dizziness, lightheadedness, or edema.  He complains of chronic fatigue.  His recent testosterone level was mildly low.  Outpatient Medications Prior to Visit  Medication Sig Dispense Refill   albuterol (VENTOLIN HFA) 108 (90 Base) MCG/ACT inhaler Inhale 2 puffs into the lungs every 6 (six) hours as needed for wheezing or shortness of breath. 8 g 2   ASPIRIN CHILDRENS 81 MG chewable tablet Chew by mouth.     brimonidine (ALPHAGAN) 0.2 % ophthalmic solution Place 1 drop into both eyes 3 (three) times daily. (Patient taking differently: Place 1 drop into both eyes 2 (two) times daily.) 10 mL 10   carvedilol (COREG) 3.125 MG tablet Take 1 tablet (3.125 mg total) by mouth 2 (two) times daily. 180 tablet 3   omeprazole (PRILOSEC) 20 MG capsule Take 2 tabs daily 30 minutes before a meal for 2 weeks and then 1 tab daily 30 minutes before a meal 60 capsule 3   tacrolimus (PROTOPIC) 0.1 % ointment APP EXT TO THE SKIN BID     thiamine 100 MG tablet Take 1 tablet (100 mg total) by mouth daily. 90 tablet 1   TRELEGY ELLIPTA 100-62.5-25 MCG/ACT AEPB Inhale 1 puff into the lungs daily. 120 each 0   triamcinolone ointment (KENALOG) 0.1 % APPLY ON THE SKIN TWICE A DAY AS NEEDED (Patient not taking: Reported on 01/19/2022)     hydrOXYzine (ATARAX/VISTARIL) 10 MG tablet Take 30-60 mg by mouth daily as needed for itching.  (Patient not  taking: Reported on 01/19/2022)     rosuvastatin (CRESTOR) 40 MG tablet Take 1 tablet (40 mg total) by mouth daily. 60 tablet 12   losartan (COZAAR) 25 MG tablet Take 1 tablet (25 mg total) by mouth daily. 30 tablet 12   No facility-administered medications prior to visit.    ROS Review of Systems  Constitutional:  Positive for fatigue. Negative for chills, diaphoresis, fever and unexpected weight change.  HENT: Negative.    Eyes: Negative.   Respiratory:  Negative for cough, chest tightness, shortness of breath and wheezing.   Cardiovascular:  Negative for chest pain, palpitations and leg swelling.  Gastrointestinal:  Negative for abdominal pain, constipation, diarrhea, nausea and vomiting.  Endocrine: Negative.   Genitourinary: Negative.  Negative for difficulty urinating, dysuria, scrotal swelling and testicular pain.  Musculoskeletal: Negative.  Negative for arthralgias and myalgias.  Skin: Negative.  Negative for color change.  Neurological:  Negative for dizziness, weakness and light-headedness.  Hematological:  Negative for adenopathy. Does not bruise/bleed easily.  Psychiatric/Behavioral: Negative.     Objective:  BP 128/78 (BP Location: Right Arm, Patient Position: Sitting, Cuff Size: Large)    Pulse 61    Temp 98.4 F (36.9 C) (Oral)    Resp 16    Ht 6' (1.829 m)    Wt 220 lb (99.8 kg)    SpO2 94%    BMI 29.84 kg/m   BP Readings from Last  3 Encounters:  01/19/22 128/78  01/02/22 132/86  01/01/22 120/81    Wt Readings from Last 3 Encounters:  01/19/22 220 lb (99.8 kg)  01/02/22 219 lb (99.3 kg)  01/01/22 214 lb 12.8 oz (97.4 kg)    Physical Exam Vitals reviewed.  Constitutional:      Appearance: He is not ill-appearing.  HENT:     Nose: Nose normal.     Mouth/Throat:     Mouth: Mucous membranes are moist.  Eyes:     General: No scleral icterus.    Conjunctiva/sclera: Conjunctivae normal.  Cardiovascular:     Rate and Rhythm: Normal rate and regular rhythm.      Heart sounds: No murmur heard. Pulmonary:     Effort: Pulmonary effort is normal.     Breath sounds: No stridor. No wheezing, rhonchi or rales.  Abdominal:     General: Abdomen is flat.     Palpations: There is no mass.     Tenderness: There is no abdominal tenderness. There is no guarding or rebound.     Hernia: No hernia is present. There is no hernia in the left inguinal area or right inguinal area.  Genitourinary:    Pubic Area: No rash.      Penis: Normal and circumcised.      Testes: Normal.     Epididymis:     Right: Normal.     Left: Normal.     Prostate: Normal. Not enlarged, not tender and no nodules present.     Rectum: Normal. Guaiac result negative. No mass, tenderness, anal fissure, external hemorrhoid or internal hemorrhoid. Normal anal tone.     Comments: Testes are small and soft Musculoskeletal:        General: Normal range of motion.     Cervical back: Neck supple.     Right lower leg: No edema.     Left lower leg: No edema.  Lymphadenopathy:     Cervical: No cervical adenopathy.     Lower Body: No right inguinal adenopathy. No left inguinal adenopathy.  Skin:    General: Skin is warm and dry.  Neurological:     General: No focal deficit present.     Mental Status: He is alert. Mental status is at baseline.  Psychiatric:        Mood and Affect: Mood normal.        Behavior: Behavior normal.    Lab Results  Component Value Date   WBC 5.1 01/02/2022   HGB 12.9 (L) 01/02/2022   HCT 40.5 01/02/2022   PLT 243.0 01/02/2022   GLUCOSE 93 01/02/2022   CHOL 184 01/19/2022   TRIG 71.0 01/19/2022   HDL 41.40 01/19/2022   LDLCALC 128 (H) 01/19/2022   ALT 17 01/02/2022   AST 28 01/02/2022   NA 134 (L) 01/02/2022   K 3.9 01/02/2022   CL 99 01/02/2022   CREATININE 1.14 01/02/2022   BUN 22 01/02/2022   CO2 27 01/02/2022   TSH 0.88 01/02/2022   PSA 0.29 01/19/2022   INR 1.1 04/04/2021   HGBA1C 6.5 01/02/2022   MICROALBUR <0.7 01/19/2022    No  results found.  Assessment & Plan:   Bradley Watson was seen today for annual exam, hyperlipidemia, hypertension and diabetes.  Diagnoses and all orders for this visit:  Hyperlipidemia LDL goal <70- He has not achieved his LDL goal.  Of asked him to be more compliant with the statin. -     rosuvastatin (CRESTOR) 40 MG tablet;  Take 1 tablet (40 mg total) by mouth daily.  Routine general medical examination at a health care facility- Exam completed, labs reviewed, vaccines reviewed and updated, cancer screenings are up-to-date, patient education was given. -     Lipid panel; Future -     PSA; Future -     PSA -     Lipid panel  Type II diabetes mellitus with manifestations ([place])- His blood sugar is adequately well controlled. 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  Will prescribe testosterone replacement therapy when he is ready. -     Luteinizing hormone; Future -     Follicle stimulating hormone; Future -     Prolactin; Future -     Prolactin -     Follicle stimulating hormone -     Luteinizing hormone  Need for vaccination -     Pneumococcal conjugate vaccine 20-valent (Prevnar 20)   I have discontinued Juanda Crumble E. Encina's hydrOXYzine. I am also having him maintain his triamcinolone ointment, tacrolimus, brimonidine, omeprazole, albuterol, Aspirin Childrens, losartan, Trelegy Ellipta, carvedilol, thiamine, and rosuvastatin.  Meds ordered this encounter  Medications   rosuvastatin (CRESTOR) 40 MG tablet    Sig: Take 1 tablet (40 mg total) by mouth daily.    Dispense:  90 tablet    Refill:  1     Follow-up: Return in about 3 months (around 04/19/2022).  Scarlette Calico,  MD

## 2022-01-20 ENCOUNTER — Encounter (INDEPENDENT_AMBULATORY_CARE_PROVIDER_SITE_OTHER): Payer: PRIVATE HEALTH INSURANCE | Admitting: Ophthalmology

## 2022-01-20 LAB — PROLACTIN: Prolactin: 10.2 ng/mL (ref 2.0–18.0)

## 2022-01-26 DIAGNOSIS — L810 Postinflammatory hyperpigmentation: Secondary | ICD-10-CM | POA: Diagnosis not present

## 2022-01-26 DIAGNOSIS — H26491 Other secondary cataract, right eye: Secondary | ICD-10-CM | POA: Diagnosis not present

## 2022-01-26 DIAGNOSIS — L2089 Other atopic dermatitis: Secondary | ICD-10-CM | POA: Diagnosis not present

## 2022-01-26 DIAGNOSIS — L218 Other seborrheic dermatitis: Secondary | ICD-10-CM | POA: Diagnosis not present

## 2022-02-02 ENCOUNTER — Ambulatory Visit (HOSPITAL_COMMUNITY): Payer: BLUE CROSS/BLUE SHIELD | Attending: Cardiology

## 2022-02-02 ENCOUNTER — Other Ambulatory Visit: Payer: BLUE CROSS/BLUE SHIELD

## 2022-02-02 ENCOUNTER — Other Ambulatory Visit: Payer: Self-pay

## 2022-02-02 DIAGNOSIS — I255 Ischemic cardiomyopathy: Secondary | ICD-10-CM | POA: Insufficient documentation

## 2022-02-02 LAB — ECHOCARDIOGRAM COMPLETE
Area-P 1/2: 1.71 cm2
S' Lateral: 3.1 cm

## 2022-02-05 ENCOUNTER — Encounter: Payer: Self-pay | Admitting: *Deleted

## 2022-02-07 NOTE — Progress Notes (Unsigned)
Cardiology Office Note   Date:  02/07/2022   ID:  Bradley Watson, DOB 1961-02-01, MRN [id number]  PCP:  [name], MD  Cardiologist:   Rollene Rotunda, MD Referring:  [name], MD  No chief complaint on file.     History of Present Illness: Bradley Watson is a 61 y.o. male who is referred by [name], MD for evaluation of a reduced EF.  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  He had LIMA to the LAD, left radial to the obtuse marginal. Echocardiogram November 2013 showed normal LV function, grade 1 diastolic dysfunction and mild left atrial enlargement. Nuclear study in June of 2015 showed an ejection fraction of 52%. Apical thinning but no ischemia. He had a repeat Lexiscan Myoview ordered by [name], MD in Jan of last year.  This demonstrated a small apical reversible defect with an EF that was slightly lower than previous.  Echocardiogram dated 02/02/2020 revealed reduced EF of 45% to 50%, the left ventricle did have some mildly decreased function.  He was found to have grade 1 diastolic dysfunction.  RV systolic function was also moderately reduced.  He was in the ED in January.2023. He had what he thought were flulike symptoms and palpitations.  He tested positive for COVID.  He was given a dose of steroids and bronchodilators and sent on his way.  I saw him and he had palpitations.  ***   *** He says he is thought he was slowly recovering and says that he tested negative recently.  However he still has cough and wheezing.  He is getting palpitations.  He says if he goes to vacuum the house he feels his heart racing.  He has to stop and let himself recover.  His episodes last for about 30 minutes.  He has been having continued cough nonproductive.  He is not having any chest pressure though he has some burning discomfort with his cough.  He has not had any weight gain or edema.  He denies any PND or orthopnea.  He is not sure that this is at all similar to his previous  symptoms prior to his bypass.   Past Medical History:  Diagnosis Date   Carotid stenosis    Pre-CABG Dopplers: RICA 40-59%;  Carotid US (05/2014):  R 1-39%; normal LICA   Cataract    OU   Childhood asthma    Cluster headaches 1985   Mid 80's   Coronary artery disease    a.  LHC 10/27/12 demonstrated pLAD 70%, AV CFX occluded, pOM 80%, dCFX collateralized the LAD and RCA, RCA without obstructive lesions, EF 50% with inferobasal HK;  b.  Echo 10/30/12: EF 55-60%, grade 1 diastolic dysfunction, mild LAE, normal LV wall motion;  c. s/p CABG 10/31/12 with Dr. Dorris Fetch:  L-LAD, left radial-OM;   b.  ETT-Nuc (05/2014):  + ECG changes, no ischemia; low risk   Diverticulitis 02-2012   CT scan    Fatty liver 02-2012   Mild- CT scan    GERD (gastroesophageal reflux disease)    HLD (hyperlipidemia)    Iron deficiency anemia 1980's   Type II diabetes mellitus with manifestations [facility])     Past Surgical History:  Procedure Laterality Date   CARDIAC CATHETERIZATION  10/27/2012   cataract  2020   CORONARY ARTERY BYPASS GRAFT  10/31/2012   Procedure: CORONARY ARTERY BYPASS GRAFTING (CABG);  Surgeon: Loreli Slot, MD;  Location: [facility] OR;  Service: Open Heart Surgery;  Laterality: N/A;   LEFT HEART CATHETERIZATION WITH CORONARY ANGIOGRAM N/A 10/27/2012   Procedure: LEFT HEART CATHETERIZATION WITH CORONARY ANGIOGRAM;  Surgeon: Herby Abraham, MD;  Location: [facility] CATH LAB;  Service: Cardiovascular;  Laterality: N/A;   RADIAL ARTERY HARVEST  10/31/2012   Procedure: RADIAL ARTERY HARVEST;  Surgeon: Loreli Slot, MD;  Location[facility] OR;  Service: Open Heart Surgery;  Laterality: Left;   RADIAL KERATOTOMY  1985   bilaterally     Current Outpatient Medications  Medication Sig Dispense Refill   albuterol (VENTOLIN HFA) 108 (90 Base) MCG/ACT inhaler Inhale 2 puffs into the lungs every 6 (six) hours as needed for wheezing or shortness of breath. 8 g 2   ASPIRIN CHILDRENS 81 MG chewable tablet  Chew by mouth.     brimonidine (ALPHAGAN) 0.2 % ophthalmic solution Place 1 drop into both eyes 3 (three) times daily. (Patient taking differently: Place 1 drop into both eyes 2 (two) times daily.) 10 mL 10   carvedilol (COREG) 3.125 MG tablet Take 1 tablet (3.125 mg total) by mouth 2 (two) times daily. 180 tablet 3   dorzolamide (TRUSOPT) 2 % ophthalmic solution Place 1 drop into both eyes daily.     losartan (COZAAR) 25 MG tablet Take 1 tablet (25 mg total) by mouth daily. 30 tablet 12   omeprazole (PRILOSEC) 20 MG capsule Take 2 tabs daily 30 minutes before a meal for 2 weeks and then 1 tab daily 30 minutes before a meal 60 capsule 3   rosuvastatin (CRESTOR) 40 MG tablet Take 1 tablet (40 mg total) by mouth daily. 90 tablet 1   tacrolimus (PROTOPIC) 0.1 % ointment APP EXT TO THE SKIN BID     thiamine 100 MG tablet Take 1 tablet (100 mg total) by mouth daily. 90 tablet 1   TRELEGY ELLIPTA 100-62.5-25 MCG/ACT AEPB Inhale 1 puff into the lungs daily. 120 each 0   triamcinolone ointment (KENALOG) 0.1 % APPLY ON THE SKIN TWICE A DAY AS NEEDED (Patient not taking: Reported on 01/19/2022)     No current facility-administered medications for this visit.    Allergies:   Penicillins and Lisinopril    ROS:  Please see the history of present illness.   Otherwise, review of systems are positive for ***.   All other systems are reviewed and negative.    PHYSICAL EXAM: VS:  There were no vitals taken for this visit. , BMI There is no height or weight on file to calculate BMI. GENERAL:  Well appearing NECK:  No jugular venous distention, waveform within normal limits, carotid upstroke brisk and symmetric, no bruits, no thyromegaly LUNGS:  Clear to auscultation bilaterally CHEST:  Unremarkable HEART:  PMI not displaced or sustained,S1 and S2 within normal limits, no S3, no S4, no clicks, no rubs, *** murmurs ABD:  Flat, positive bowel sounds normal in frequency in pitch, no bruits, no rebound, no  guarding, no midline pulsatile mass, no hepatomegaly, no splenomegaly EXT:  2 plus pulses throughout, no edema, no cyanosis no clubbing      ***GENERAL:  Well appearing NECK:  No jugular venous distention, waveform within normal limits, carotid upstroke brisk and symmetric, no bruits, no thyromegaly LUNGS: Diffuse expiratory wheezes CHEST: Well-healed surgical scar HEART:  PMI not displaced or sustained,S1 and S2 within normal limits, no S3, no S4, no clicks, no rubs, no murmurs ABD:  Flat, positive bowel sounds normal in frequency in pitch, no bruits, no rebound, no  guarding, no midline pulsatile mass, no hepatomegaly, no splenomegaly EXT:  2 plus pulses throughout, no edema, no cyanosis no clubbing   EKG:  EKG is *** ordered today. The ekg ordered 01/01/2022 demonstrates sinus rhythm, rate ***, axis within normal limits, intervals within limits, no acute ST-T wave changes.   Recent Labs: 04/04/2021: B Natriuretic Peptide 37.5 01/02/2022: ALT 17; BUN 22; Creatinine, Ser 1.14; Hemoglobin 12.9; Platelets 243.0; Potassium 3.9; Sodium 134; TSH 0.88    Lipid Panel    Component Value Date/Time   CHOL 184 01/19/2022 1024   CHOL 208 (H) 08/27/2020 1104   TRIG 71.0 01/19/2022 1024   HDL 41.40 01/19/2022 1024   HDL 54 08/27/2020 1104   CHOLHDL 4 01/19/2022 1024   VLDL 14.2 01/19/2022 1024   LDLCALC 128 (H) 01/19/2022 1024   LDLCALC 138 (H) 08/27/2020 1104      Wt Readings from Last 3 Encounters:  01/19/22 220 lb (99.8 kg)  01/02/22 219 lb (99.3 kg)  01/01/22 214 lb 12.8 oz (97.4 kg)      Other studies Reviewed: Additional studies/ records that were reviewed today include:  ***  Review of the above records demonstrates:  Please see elsewhere in the note.     ASSESSMENT AND PLAN:  CAD:    ***  The patient does not have classic anginal symptoms.  I think what he is experiencing with rapid rate and atypical chest discomfort is probably COVID-related and I did send a note to his  primary provider asking if they could potentially move up his follow-up appointment or whether they wanted to give him steroids or other post COVID therapies.  I do not think an ischemia work-up is indicated at this point.   PALPITATIONS:   ***  He is having palpitations but he did wear a monitor in May of last year.  He had some SVT and he could be experiencing this.  However, with his recent COVID diagnosis and ongoing wheezing I think this needs to be managed before significant change.  I am going to restart his at 3.125 mg twice daily.  ISCHEMIC CARDIOMYOPATHY:   ***   As above he has been intolerant of meds and I am going to restart his carvedilol 3.125 mg twice daily.  I have to titrate slowly because of his medication sensitivities.  I will be checking an echocardiogram.  HTN:   The blood pressure is *** at target.   DM (borderline): A1c was ***  6.2.  No change in therapy.   DYSLIPIDEMIA: LDL  :  He needs a lipid profile.  ***  When he comes back I will get a lipid profile.   CAROTID STENOSIS: He had very mild plaque.  No change in therapy.  ***   Current medicines are reviewed at length with the patient today.  The patient does not have concerns regarding medicines.  The following changes have been made:  ***   Labs/ tests ordered today include: ***  No orders of the defined types were placed in this encounter.    Disposition:   FU with me in *** month.      Signed, Rollene Rotunda, MD  02/07/2022 12:17 PM    [facility]

## 2022-02-09 ENCOUNTER — Ambulatory Visit: Payer: BLUE CROSS/BLUE SHIELD | Admitting: Cardiology

## 2022-02-09 DIAGNOSIS — I10 Essential (primary) hypertension: Secondary | ICD-10-CM

## 2022-02-09 DIAGNOSIS — E785 Hyperlipidemia, unspecified: Secondary | ICD-10-CM

## 2022-02-09 DIAGNOSIS — I2510 Atherosclerotic heart disease of native coronary artery without angina pectoris: Secondary | ICD-10-CM

## 2022-02-09 DIAGNOSIS — I255 Ischemic cardiomyopathy: Secondary | ICD-10-CM

## 2022-02-09 DIAGNOSIS — R002 Palpitations: Secondary | ICD-10-CM

## 2022-02-11 ENCOUNTER — Telehealth: Payer: Self-pay | Admitting: Cardiology

## 2022-02-11 NOTE — Telephone Encounter (Signed)
Spoke with patient who reports he started feeling sick yesterday, has no energy, and has no appetite. His BP cuff needs batteries, no BP reading. Denies chest pain or sob. Encouraged to drink fluids. Has an appointment with Dr. Percival Spanish on 2/24.

## 2022-02-11 NOTE — Telephone Encounter (Signed)
Patient states this morning while getting ready for work he started to feel extremely fatigued and weak. He states he has no energy at all and this feeling came out of nowhere. 3/06 appointment has been moved up to 2/24 with Dr. Antoine Poche.

## 2022-02-11 NOTE — Progress Notes (Signed)
Cardiology Office Note   Date:  02/13/2022   ID:  Pearce, Littlefield 1961/09/12, MRN [id number]  PCP:  Janith Lima, MD  Cardiologist:   Minus Breeding, MD Referring:  Janith Lima, MD  Chief Complaint  Patient presents with   Fatigue      History of Present Illness: Bradley Watson is a 61 y.o. male who is referred by Janith Lima, MD for evaluation of a reduced EF.  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  He had LIMA to the LAD, left radial to the obtuse marginal. Echocardiogram November 2013 showed normal LV function, grade 1 diastolic dysfunction and mild left atrial enlargement. Nuclear study in June of 2015 showed an ejection fraction of 52%. Apical thinning but no ischemia. He had a repeat Lexiscan Myoview ordered by Janith Lima, MD in Jan of last year.  This demonstrated a small apical reversible defect with an EF that was slightly lower than previous.  Echocardiogram dated 02/02/2020 revealed reduced EF of 45% to 50%, the left ventricle did have some mildly decreased function.  He was found to have grade 1 diastolic dysfunction.  RV systolic function was also moderately reduced.  He was in the ED in January.2023. He had what he thought were flulike symptoms and palpitations.  He tested positive for COVID.  He was given a dose of steroids and bronchodilators and sent on his way.  I saw him and he had palpitations.    He feels very poorly today.  Has been off of work for a week because he is so fatigued.  He does not sleep at all at night .  He has coughing and nasal congestion.  He has mucus in his chest.  He feels like he cannot breathe out of his nose.  He is not really describing PND or orthopnea.  He is not having any chest pressure, neck or arm discomfort.  He has had no weight gain or edema.  Reviewing his echo I am pleased to see that the 1 in January showed his EF to be 55 to 60%.  Chest x-ray done earlier this year demonstrated no acute disease.   Past Medical  History:  Diagnosis Date   Carotid stenosis    Pre-CABG Dopplers: RICA 40-59%;  Carotid US (05/2014):  R 7-78%; normal LICA   Cataract    OU   Childhood asthma    Cluster headaches 1985   Mid 80's   Coronary artery disease    a.  LHC 10/27/12 demonstrated pLAD 70%, AV CFX occluded, pOM 80%, dCFX collateralized the LAD and RCA, RCA without obstructive lesions, EF 50% with inferobasal HK;  b.  Echo 10/30/12: EF 24-23%, grade 1 diastolic dysfunction, mild LAE, normal LV wall motion;  c. s/p CABG 10/31/12 with Dr. Roxan Hockey:  L-LAD, left radial-OM;   b.  ETT-Nuc (05/2014):  + ECG changes, no ischemia; low risk   Diverticulitis 02-2012   CT scan    Fatty liver 02-2012   Mild- CT scan    GERD (gastroesophageal reflux disease)    HLD (hyperlipidemia)    Iron deficiency anemia 1980's   Type II diabetes mellitus with manifestations [facility])     Past Surgical History:  Procedure Laterality Date   CARDIAC CATHETERIZATION  10/27/2012   cataract  2020   CORONARY ARTERY BYPASS GRAFT  10/31/2012   Procedure: CORONARY ARTERY BYPASS GRAFTING (CABG);  Surgeon: Melrose Nakayama, MD;  Location: [place];  Service: Open  Heart Surgery;  Laterality: N/A;   LEFT HEART CATHETERIZATION WITH CORONARY ANGIOGRAM N/A 10/27/2012   Procedure: LEFT HEART CATHETERIZATION WITH CORONARY ANGIOGRAM;  Surgeon: Hillary Bow, MD;  Location[facility] CATH LAB;  Service: Cardiovascular;  Laterality: N/A;   RADIAL ARTERY HARVEST  10/31/2012   Procedure: RADIAL ARTERY HARVEST;  Surgeon: Melrose Nakayama, MD;  Location: [place];  Service: Open Heart Surgery;  Laterality: Left;   RADIAL KERATOTOMY  1985   bilaterally     Current Outpatient Medications  Medication Sig Dispense Refill   ASPIRIN CHILDRENS 81 MG chewable tablet Chew by mouth.     brimonidine (ALPHAGAN) 0.2 % ophthalmic solution Place 1 drop into both eyes 3 (three) times daily. (Patient taking differently: Place 1 drop into both eyes 2 (two) times daily.) 10 mL 10    carvedilol (COREG) 3.125 MG tablet Take 1 tablet (3.125 mg total) by mouth 2 (two) times daily. 180 tablet 3   dorzolamide (TRUSOPT) 2 % ophthalmic solution Place 1 drop into both eyes daily.     DUPIXENT 300 MG/2ML SOPN Inject into the skin.     rosuvastatin (CRESTOR) 40 MG tablet Take 1 tablet (40 mg total) by mouth daily. 90 tablet 1   thiamine 100 MG tablet Take 1 tablet (100 mg total) by mouth daily. 90 tablet 1   TRELEGY ELLIPTA 100-62.5-25 MCG/ACT AEPB Inhale 1 puff into the lungs daily. 120 each 0   triamcinolone ointment (KENALOG) 0.1 %      albuterol (VENTOLIN HFA) 108 (90 Base) MCG/ACT inhaler Inhale 2 puffs into the lungs every 6 (six) hours as needed for wheezing or shortness of breath. (Patient not taking: Reported on 02/13/2022) 8 g 2   losartan (COZAAR) 25 MG tablet Take 1 tablet (25 mg total) by mouth daily. 30 tablet 12   omeprazole (PRILOSEC) 20 MG capsule Take 2 tabs daily 30 minutes before a meal for 2 weeks and then 1 tab daily 30 minutes before a meal (Patient not taking: Reported on 02/13/2022) 60 capsule 3   No current facility-administered medications for this visit.    Allergies:   Penicillins and Lisinopril    ROS:  Please see the history of present illness.   Otherwise, review of systems are positive for none.   All other systems are reviewed and negative.    PHYSICAL EXAM: VS:  BP 106/80    Pulse 76    Ht 6' (1.829 m)    Wt 213 lb 9.6 oz (96.9 kg)    SpO2 94%    BMI 28.97 kg/m  , BMI Body mass index is 28.97 kg/m. GENERAL:  Well appearing NECK:  No jugular venous distention, waveform within normal limits, carotid upstroke brisk and symmetric, no bruits, no thyromegaly LUNGS: Diffuse wheezing CHEST:  Unremarkable HEART:  PMI not displaced or sustained,S1 and S2 within normal limits, no S3, no S4, no clicks, no rubs, no murmurs ABD:  Flat, positive bowel sounds normal in frequency in pitch, no bruits, no rebound, no guarding, no midline pulsatile mass, no  hepatomegaly, no splenomegaly EXT:  2 plus pulses throughout, no edema, no cyanosis no clubbing   EKG:  EKG is not ordered today.   Recent Labs: 04/04/2021: B Natriuretic Peptide 37.5 01/02/2022: ALT 17; BUN 22; Creatinine, Ser 1.14; Hemoglobin 12.9; Platelets 243.0; Potassium 3.9; Sodium 134; TSH 0.88    Lipid Panel    Component Value Date/Time   CHOL 184 01/19/2022 1024   CHOL 208 (H) 08/27/2020 1104  TRIG 71.0 01/19/2022 1024   HDL 41.40 01/19/2022 1024   HDL 54 08/27/2020 1104   CHOLHDL 4 01/19/2022 1024   VLDL 14.2 01/19/2022 1024   LDLCALC 128 (H) 01/19/2022 1024   LDLCALC 138 (H) 08/27/2020 1104      Wt Readings from Last 3 Encounters:  02/13/22 213 lb 9.6 oz (96.9 kg)  01/19/22 220 lb (99.8 kg)  01/02/22 219 lb (99.3 kg)      Other studies Reviewed: Additional studies/ records that were reviewed today include:  ED records  Review of the above records demonstrates:  Please see elsewhere in the note.     ASSESSMENT AND PLAN:  CAD:     I would have no suggestion that he was having any acute coronary syndromes.  He will continue with aggressive risk reduction.  PALPITATIONS:   He previously had some SVT.  However, he is not really complaining of this currently.  No change in therapy.  ISCHEMIC CARDIOMYOPATHY:   I will check a BNP but his ejection fraction was actually improved on the echo above.  No further med titration at this point.  HTN:   The blood pressure is low and so no further med titration.   DM (borderline): A1c was 6.5.  Therapy per Janith Lima, MD  DYSLIPIDEMIA: LDL  :  He needs a lipid profile.  I will draw that today with a goal LDL less than 70   CAROTID STENOSIS: He had very mild plaque.  No change in therapy.    FATIGUE: I think this is probably related to his lack of sleep.  This will be addressed as below.  COUGH: He has an abnormal lung exam.  He has cough.  He has lots of sinus congestion.  We talked about treatment with saline,  nasal steroids.  I will refer him to ENT.  Also because of the lung exam and going to check a BNP.  I do not suspect diastolic heart failure.  I will be checking some inflammatory markers and his post-COVID situation with CRP and sed rate.  I will also order pulmonary function testing.   Current medicines are reviewed at length with the patient today.  The patient does not have concerns regarding medicines.  The following changes have been made:  None   Labs/ tests ordered today include:   Orders Placed This Encounter  Procedures   Brain natriuretic peptide   Sed Rate (ESR)   C-reactive protein   Lipid panel   Ambulatory referral to ENT   Pulmonary Function Test     Disposition:   FU with me in 4 month.      Signed, Minus Breeding, MD  02/13/2022 9:58 AM    [facility]

## 2022-02-13 ENCOUNTER — Other Ambulatory Visit: Payer: Self-pay

## 2022-02-13 ENCOUNTER — Encounter: Payer: Self-pay | Admitting: Cardiology

## 2022-02-13 ENCOUNTER — Ambulatory Visit: Payer: BLUE CROSS/BLUE SHIELD | Admitting: Cardiology

## 2022-02-13 VITALS — BP 106/80 | HR 76 | Ht 72.0 in | Wt 213.6 lb

## 2022-02-13 DIAGNOSIS — J3489 Other specified disorders of nose and nasal sinuses: Secondary | ICD-10-CM | POA: Diagnosis not present

## 2022-02-13 DIAGNOSIS — R0602 Shortness of breath: Secondary | ICD-10-CM

## 2022-02-13 DIAGNOSIS — E785 Hyperlipidemia, unspecified: Secondary | ICD-10-CM

## 2022-02-13 NOTE — Patient Instructions (Addendum)
Medication Instructions:  No Changes In Medications at this time.  *If you need a refill on your cardiac medications before your next appointment, please call your pharmacy*  Lab Work: ESR, BNP, CRP, LIPID- TODAY  If you have labs (blood work) drawn today and your tests are completely normal, you will receive your results only by: [place] (if you have MyChart) OR A paper copy in the mail If you have any lab test that is abnormal or we need to change your treatment, we will call you to review the results.  Testing/Procedures: PULMONARY FUNCTION TESTS- SOMEONE WILL REACH OUT TO GET YOU SCHEDULED FOR THIS   Follow-Up: At [facility], you and your health needs are our priority.  As part of our continuing mission to provide you with exceptional heart care, we have created designated Provider Care Teams.  These Care Teams include your primary Cardiologist (physician) and Advanced Practice Providers (APPs -  Physician Assistants and Nurse Practitioners) who all work together to provide you with the care you need, when you need it.  Your next appointment:   6 month(s)  The format for your next appointment:   In Person  Provider:   Minus Breeding, MD    Other Instructions ENT REFERRAL

## 2022-02-14 LAB — BRAIN NATRIURETIC PEPTIDE: BNP: 31.4 pg/mL (ref 0.0–100.0)

## 2022-02-14 LAB — LIPID PANEL
Chol/HDL Ratio: 4.3 ratio (ref 0.0–5.0)
Cholesterol, Total: 221 mg/dL — ABNORMAL HIGH (ref 100–199)
HDL: 52 mg/dL (ref >39)
LDL Chol Calc (NIH): 153 mg/dL — ABNORMAL HIGH (ref 0–99)
Triglycerides: 91 mg/dL (ref 0–149)
VLDL Cholesterol Cal: 16 mg/dL (ref 5–40)

## 2022-02-14 LAB — C-REACTIVE PROTEIN: CRP: 2 mg/L (ref 0–10)

## 2022-02-14 LAB — SEDIMENTATION RATE: Sed Rate: 44 mm/hr — ABNORMAL HIGH (ref 0–30)

## 2022-02-17 ENCOUNTER — Telehealth: Payer: Self-pay | Admitting: *Deleted

## 2022-02-17 DIAGNOSIS — E785 Hyperlipidemia, unspecified: Secondary | ICD-10-CM

## 2022-02-17 MED ORDER — EZETIMIBE 10 MG PO TABS: 10.0000 mg | ORAL_TABLET | Freq: Every day | ORAL | 3 refills | Status: DC

## 2022-02-17 NOTE — Telephone Encounter (Signed)
-----   Message from Minus Breeding, MD sent at 02/13/2022  5:27 PM EST ----- His LDL is not at all at target.  I would like him to start Zetia 10 mg and then be seen in the [facility] after he gets a lipid profile in about 10 weeks.  He will likely need a PCSK9 inhibitor.

## 2022-02-17 NOTE — Telephone Encounter (Signed)
Spoke with pt, aware of results and recommendations. New script sent to the pharmacy  Lab orders mailed to the pt

## 2022-02-23 ENCOUNTER — Ambulatory Visit: Payer: BLUE CROSS/BLUE SHIELD | Admitting: Nurse Practitioner

## 2022-04-13 DIAGNOSIS — L2089 Other atopic dermatitis: Secondary | ICD-10-CM | POA: Diagnosis not present

## 2022-05-20 ENCOUNTER — Encounter: Payer: Self-pay | Admitting: *Deleted

## 2022-05-25 ENCOUNTER — Ambulatory Visit: Payer: BLUE CROSS/BLUE SHIELD

## 2022-07-22 ENCOUNTER — Other Ambulatory Visit: Payer: Self-pay | Admitting: Internal Medicine

## 2022-07-22 DIAGNOSIS — E785 Hyperlipidemia, unspecified: Secondary | ICD-10-CM

## 2022-08-10 DIAGNOSIS — J310 Chronic rhinitis: Secondary | ICD-10-CM | POA: Insufficient documentation

## 2022-08-10 DIAGNOSIS — T485X5A Adverse effect of other anti-common-cold drugs, initial encounter: Secondary | ICD-10-CM | POA: Diagnosis not present

## 2022-09-21 ENCOUNTER — Ambulatory Visit: Payer: BLUE CROSS/BLUE SHIELD | Admitting: Cardiology

## 2022-10-15 NOTE — Progress Notes (Shared)
[facility] - Clinic Note  10/19/2022     CHIEF COMPLAINT Patient presents for No chief complaint on file.   HISTORY OF PRESENT ILLNESS: Bradley Watson is a 61 y.o. male who presents to the clinic today for:     pt is here on the referral of Dr. Charlotte Sanes for retinal clearance for yag, he saw her last week and she noticed there were a lot more floaters in his right eye than there were in his left eye, she also noticed a film on the back of his cataract lens, pt states Dr. Charlotte Sanes wanted Dr. Laban Emperor advice on what to do   Referring physician: [name], MD [address] [place],  [place] [address]  HISTORICAL INFORMATION:   Selected notes from the MEDICAL RECORD NUMBER Referred by Dr. Maris Berger for concern of retinal tear OS LEE: 06.10.20 ([facility]. [facility]) [BCVA: OD: 20/40 OS: 20/25] Ocular Hx- PMH-glaucoma suspect, lattice OU, NS OU, myopia, h/o RK OU   CURRENT MEDICATIONS: Current Outpatient Medications (Ophthalmic Drugs)  Medication Sig   brimonidine (ALPHAGAN) 0.2 % ophthalmic solution Place 1 drop into both eyes 3 (three) times daily. (Patient taking differently: Place 1 drop into both eyes 2 (two) times daily.)   dorzolamide (TRUSOPT) 2 % ophthalmic solution Place 1 drop into both eyes daily.   No current facility-administered medications for this visit. (Ophthalmic Drugs)   Current Outpatient Medications (Other)  Medication Sig   albuterol (VENTOLIN HFA) 108 (90 Base) MCG/ACT inhaler Inhale 2 puffs into the lungs every 6 (six) hours as needed for wheezing or shortness of breath. (Patient not taking: Reported on 02/13/2022)   ASPIRIN CHILDRENS 81 MG chewable tablet Chew by mouth.   carvedilol (COREG) 3.125 MG tablet Take 1 tablet (3.125 mg total) by mouth 2 (two) times daily.   DUPIXENT 300 MG/2ML SOPN Inject into the skin.   ezetimibe (ZETIA) 10 MG tablet Take 1 tablet (10 mg total) by mouth daily.   losartan (COZAAR) 25 MG tablet Take 1  tablet (25 mg total) by mouth daily.   omeprazole (PRILOSEC) 20 MG capsule Take 2 tabs daily 30 minutes before a meal for 2 weeks and then 1 tab daily 30 minutes before a meal (Patient not taking: Reported on 02/13/2022)   rosuvastatin (CRESTOR) 40 MG tablet Take 1 tablet (40 mg total) by mouth daily.   thiamine 100 MG tablet Take 1 tablet (100 mg total) by mouth daily.   TRELEGY ELLIPTA 100-62.5-25 MCG/ACT AEPB Inhale 1 puff into the lungs daily.   triamcinolone ointment (KENALOG) 0.1 %    No current facility-administered medications for this visit. (Other)   REVIEW OF SYSTEMS:   ALLERGIES Allergies  Allergen Reactions   Penicillins Anxiety and Other (See Comments)    "Made me feel like gravity had increased and it was pulling me down" (10/27/2012) Did it involve swelling of the face/tongue/throat, SOB, or low BP? No Did it involve sudden or severe rash/hives, skin peeling, or any reaction on the inside of your mouth or nose? No Did you need to seek medical attention at a hospital or doctor's office? No When did it last happen? Over 10 years ago If all above answers are "NO", may proceed with cephalosporin use.   Lisinopril Cough    PAST MEDICAL HISTORY Past Medical History:  Diagnosis Date   Carotid stenosis    Pre-CABG Dopplers: RICA 40-59%;  Carotid US (05/2014):  R 1-39%; normal LICA   Cataract  OU   Childhood asthma    Cluster headaches 1985   Mid 80's   Coronary artery disease    a.  LHC 10/27/12 demonstrated pLAD 70%, AV CFX occluded, pOM 80%, dCFX collateralized the LAD and RCA, RCA without obstructive lesions, EF 50% with inferobasal HK;  b.  Echo 10/30/12: EF 31-49%, grade 1 diastolic dysfunction, mild LAE, normal LV wall motion;  c. s/p CABG 10/31/12 with Dr. Roxan Hockey:  L-LAD, left radial-OM;   b.  ETT-Nuc (05/2014):  + ECG changes, no ischemia; low risk   Diverticulitis 02-2012   CT scan    Fatty liver 02-2012   Mild- CT scan    GERD (gastroesophageal reflux  disease)    HLD (hyperlipidemia)    Iron deficiency anemia 1980's   Type II diabetes mellitus with manifestations [facility])    Past Surgical History:  Procedure Laterality Date   CARDIAC CATHETERIZATION  10/27/2012   cataract  2020   CORONARY ARTERY BYPASS GRAFT  10/31/2012   Procedure: CORONARY ARTERY BYPASS GRAFTING (CABG);  Surgeon: Melrose Nakayama, MD;  Location: [place];  Service: Open Heart Surgery;  Laterality: N/A;   LEFT HEART CATHETERIZATION WITH CORONARY ANGIOGRAM N/A 10/27/2012   Procedure: LEFT HEART CATHETERIZATION WITH CORONARY ANGIOGRAM;  Surgeon: Hillary Bow, MD;  Location: [facility] CATH LAB;  Service: Cardiovascular;  Laterality: N/A;   RADIAL ARTERY HARVEST  10/31/2012   Procedure: RADIAL ARTERY HARVEST;  Surgeon: Melrose Nakayama, MD;  Location: [place];  Service: Open Heart Surgery;  Laterality: Left;   RADIAL KERATOTOMY  1985   bilaterally   FAMILY HISTORY Family History  Problem Relation Age of Onset   Glaucoma Mother    Other Mother        car accident    Lung cancer Father    Glaucoma Paternal Grandmother    Diabetes Mellitus II Paternal Grandmother    SOCIAL HISTORY Social History   Tobacco Use   Smoking status: Former    Packs/day: 0.12    Years: 1.50    Total pack years: 0.18    Types: Cigarettes   Smokeless tobacco: Never  Vaping Use   Vaping Use: Never used  Substance Use Topics   Alcohol use: Not Currently    Comment: weekly   Drug use: Not Currently       OPHTHALMIC EXAM:  Not recorded     IMAGING AND PROCEDURES  Imaging and Procedures for @TODAY @            ASSESSMENT/PLAN:    ICD-10-CM   1. Retinal tear of both eyes  H33.313     2. 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 Vitreous hemorrhage of both eyes ([place])  H43.13     5. 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 History of radial keratotomy  Z98.890      7. Combined forms of age-related cataract of left eye  H25.812     8. Pseudophakia  Z96.1     9. PCO (posterior capsular opacification), right  H26.491     10. Glaucoma suspect of both eyes  H40.003      1-4. Retinal tears with focal retinal detachments and vitreous hemorrhage OU          Lattice degeneration w/ atrophic holes OU  - s/p laser retinopexy OD (6.26.20)  to large superonasal HST and superior patches of lattice, fill-in (07.01.20) to HST at 1045, fill-in (08.07.20) -- good laser surrounding all lesions  - s/p laser retinopexy OS (06.10.20) and touch up laser with Dr. Ashley Royalty 6.19.2020 -- good laser surrounding lesions OS (HST at 0130 with cuff of +SRF and +lattice within flap, scattered patches of lattice degeneration)  - both eyes with diffuse vitreous hemorrhages 2/2 RTs w/ bridging vessels, vitreous hemes improving OU  - s/p cryopexy OD (08.21.20) to cauterize bridging vessels OD -- good cryo changes in place and bridging vessels sclerosed  - all retinal lesions now well treated with laser and cryo -- no new RT/RD  - VH cleared  - f/u 9-12 months, DFE, OCT  5,6. History of high myopia s/p 8-cut Radial Keratotomy OU  - discussed increased risk of lattice degeneration, RT, RD in high myopes  - stable  - monitor  7. 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  - discussed diagnosis and progression  - under the expert management of Dr. Charlotte Sanes   - clear from a retinal standpoint to proceed with cataract surgery when both patient and surgeon are ready  8,9. Pseudophakia OD  - s/p CE/IOL (October 2020, [facility])  - beautiful surgery, doing well  - 1-2+ PCO -- clear from a retina standpoint to proceed with YAG cap when pt and surgeon are ready  - monitor  10. 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 Charlotte Sanes  -  continue brimonidine TID OU and cosopt BID OU   Ophthalmic Meds Ordered this visit:  No orders of the defined types were placed in this encounter.      No follow-ups on file.  There are no Patient Instructions on file for this visit.   Explained the diagnoses, plan, and follow up with the patient and they expressed understanding.  Patient expressed understanding of the importance of proper follow up care.   This document serves as a record of services personally performed by Karie Chimera, MD, PhD. It was created on their behalf by Glee Arvin. Manson Passey, OA an ophthalmic technician. The creation of this record is the provider's dictation and/or activities during the visit.    Electronically signed by: Glee Arvin. [place], [place] 10.26.2023 12:22 PM     Karie Chimera, M.D., Ph.D. Diseases & Surgery of the Retina and Vitreous [facility]     Abbreviations: M myopia (nearsighted); A astigmatism; H hyperopia (farsighted); P presbyopia; Mrx spectacle prescription;  CTL contact lenses; OD right eye; OS left eye; OU both eyes  XT exotropia; ET esotropia; PEK punctate epithelial keratitis; PEE punctate epithelial erosions; DES dry eye syndrome; MGD meibomian gland dysfunction; ATs artificial tears; PFAT's preservative free artificial tears; NSC nuclear sclerotic cataract; PSC posterior subcapsular cataract; ERM epi-retinal membrane; PVD posterior vitreous detachment; RD retinal detachment; DM diabetes mellitus; DR diabetic retinopathy; NPDR non-proliferative diabetic retinopathy; PDR proliferative diabetic retinopathy; CSME clinically significant macular edema; DME diabetic macular edema; dbh dot blot hemorrhages; CWS cotton wool spot; POAG primary open angle glaucoma; C/D cup-to-disc ratio; HVF humphrey visual field; GVF goldmann visual field; OCT optical coherence tomography; IOP intraocular pressure; BRVO Branch retinal vein occlusion; CRVO central retinal vein occlusion; CRAO  central retinal artery occlusion; BRAO branch retinal artery occlusion; RT retinal tear; SB scleral buckle;  PPV pars plana vitrectomy; VH Vitreous hemorrhage; PRP panretinal laser photocoagulation; IVK intravitreal kenalog; VMT vitreomacular traction; MH Macular hole;  NVD neovascularization of the disc; NVE neovascularization elsewhere; AREDS age related eye disease study; ARMD age related macular degeneration; POAG primary open angle glaucoma; EBMD epithelial/anterior basement membrane dystrophy; ACIOL anterior chamber intraocular lens; IOL intraocular lens; PCIOL posterior chamber intraocular lens; Phaco/IOL phacoemulsification with intraocular lens placement; [place] photorefractive keratectomy; LASIK laser assisted in situ keratomileusis; HTN hypertension; DM diabetes mellitus; COPD chronic obstructive pulmonary disease

## 2022-10-19 ENCOUNTER — Encounter (INDEPENDENT_AMBULATORY_CARE_PROVIDER_SITE_OTHER): Payer: BLUE CROSS/BLUE SHIELD | Admitting: Ophthalmology

## 2022-10-19 DIAGNOSIS — Z9889 Other specified postprocedural states: Secondary | ICD-10-CM

## 2022-10-19 DIAGNOSIS — H33313 Horseshoe tear of retina without detachment, bilateral: Secondary | ICD-10-CM

## 2022-10-19 DIAGNOSIS — Z961 Presence of intraocular lens: Secondary | ICD-10-CM

## 2022-10-19 DIAGNOSIS — H3323 Serous retinal detachment, bilateral: Secondary | ICD-10-CM

## 2022-10-19 DIAGNOSIS — H26491 Other secondary cataract, right eye: Secondary | ICD-10-CM

## 2022-10-19 DIAGNOSIS — H40003 Preglaucoma, unspecified, bilateral: Secondary | ICD-10-CM

## 2022-10-19 DIAGNOSIS — H35413 Lattice degeneration of retina, bilateral: Secondary | ICD-10-CM

## 2022-10-19 DIAGNOSIS — H25812 Combined forms of age-related cataract, left eye: Secondary | ICD-10-CM

## 2022-10-19 DIAGNOSIS — H5213 Myopia, bilateral: Secondary | ICD-10-CM

## 2022-10-19 DIAGNOSIS — H4313 Vitreous hemorrhage, bilateral: Secondary | ICD-10-CM

## 2022-11-08 NOTE — Progress Notes (Deleted)
]    Cardiology Office Note   Date:  11/08/2022   ID:  Bradley Watson, DOB Apr 29, 1961, MRN [id number]  PCP:  Bradley Grandchild, MD  Cardiologist:   Rollene Rotunda, MD Referring:  Bradley Grandchild, MD  No chief complaint on file.     History of Present Illness: Bradley Watson is a 61 y.o. male who is referred by Bradley Grandchild, MD for evaluation of a reduced EF.  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  He had LIMA to the LAD, left radial to the obtuse marginal. Echocardiogram November 2013 showed normal LV function, grade 1 diastolic dysfunction and mild left atrial enlargement. Nuclear study in June of 2015 showed an ejection fraction of 52%. Apical thinning but no ischemia. He had a repeat Lexiscan Myoview ordered by Bradley Grandchild, MD in Jan of last year.  This demonstrated a small apical reversible defect with an EF that was slightly lower than previous.  Echocardiogram dated 02/02/2020 revealed reduced EF of 45% to 50%, the left ventricle did have some mildly decreased function.  He was found to have grade 1 diastolic dysfunction.  RV systolic function was also moderately reduced.  He was in the ED in January, 2023. He had what he thought were flulike symptoms and palpitations.  He tested positive for COVID.  He was given a dose of steroids and bronchodilators and sent on his way.  I saw him and he had palpitations.  January 2023 echo showed his EF to be 55 to 60%.  Chest x-ray done earlier this year demonstrated no acute disease.  ***  He feels very poorly today.  Has been off of work for a week because he is so fatigued.  He does not sleep at all at night .  He has coughing and nasal congestion.  He has mucus in his chest.  He feels like he cannot breathe out of his nose.  He is not really describing PND or orthopnea.  He is not having any chest pressure, neck or arm discomfort.  He has had no weight gain or edema.  Reviewing his echo I am pleased to see that the 1 in   Past Medical History:   Diagnosis Date   Carotid stenosis    Pre-CABG Dopplers: RICA 40-59%;  Carotid US (05/2014):  R 1-39%; normal LICA   Cataract    OU   Childhood asthma    Cluster headaches 1985   Mid 80's   Coronary artery disease    a.  LHC 10/27/12 demonstrated pLAD 70%, AV CFX occluded, pOM 80%, dCFX collateralized the LAD and RCA, RCA without obstructive lesions, EF 50% with inferobasal HK;  b.  Echo 10/30/12: EF 55-60%, grade 1 diastolic dysfunction, mild LAE, normal LV wall motion;  c. s/p CABG 10/31/12 with Dr. Dorris Fetch:  L-LAD, left radial-OM;   b.  ETT-Nuc (05/2014):  + ECG changes, no ischemia; low risk   Diverticulitis 02-2012   CT scan    Fatty liver 02-2012   Mild- CT scan    GERD (gastroesophageal reflux disease)    HLD (hyperlipidemia)    Iron deficiency anemia 1980's   Type II diabetes mellitus with manifestations [facility])     Past Surgical History:  Procedure Laterality Date   CARDIAC CATHETERIZATION  10/27/2012   cataract  2020   CORONARY ARTERY BYPASS GRAFT  10/31/2012   Procedure: CORONARY ARTERY BYPASS GRAFTING (CABG);  Surgeon: Loreli Slot, MD;  Location: [facility] OR;  Service:  Open Heart Surgery;  Laterality: N/A;   LEFT HEART CATHETERIZATION WITH CORONARY ANGIOGRAM N/A 10/27/2012   Procedure: LEFT HEART CATHETERIZATION WITH CORONARY ANGIOGRAM;  Surgeon: Herby Abraham, MD;  Location: [facility] CATH LAB;  Service: Cardiovascular;  Laterality: N/A;   RADIAL ARTERY HARVEST  10/31/2012   Procedure: RADIAL ARTERY HARVEST;  Surgeon: Loreli Slot, MD;  Location: [facility] OR;  Service: Open Heart Surgery;  Laterality: Left;   RADIAL KERATOTOMY  1985   bilaterally     Current Outpatient Medications  Medication Sig Dispense Refill   albuterol (VENTOLIN HFA) 108 (90 Base) MCG/ACT inhaler Inhale 2 puffs into the lungs every 6 (six) hours as needed for wheezing or shortness of breath. (Patient not taking: Reported on 02/13/2022) 8 g 2   ASPIRIN CHILDRENS 81 MG chewable tablet Chew by  mouth.     brimonidine (ALPHAGAN) 0.2 % ophthalmic solution Place 1 drop into both eyes 3 (three) times daily. (Patient taking differently: Place 1 drop into both eyes 2 (two) times daily.) 10 mL 10   carvedilol (COREG) 3.125 MG tablet Take 1 tablet (3.125 mg total) by mouth 2 (two) times daily. 180 tablet 3   dorzolamide (TRUSOPT) 2 % ophthalmic solution Place 1 drop into both eyes daily.     DUPIXENT 300 MG/2ML SOPN Inject into the skin.     ezetimibe (ZETIA) 10 MG tablet Take 1 tablet (10 mg total) by mouth daily. 90 tablet 3   losartan (COZAAR) 25 MG tablet Take 1 tablet (25 mg total) by mouth daily. 30 tablet 12   omeprazole (PRILOSEC) 20 MG capsule Take 2 tabs daily 30 minutes before a meal for 2 weeks and then 1 tab daily 30 minutes before a meal (Patient not taking: Reported on 02/13/2022) 60 capsule 3   rosuvastatin (CRESTOR) 40 MG tablet Take 1 tablet (40 mg total) by mouth daily. 90 tablet 1   thiamine 100 MG tablet Take 1 tablet (100 mg total) by mouth daily. 90 tablet 1   TRELEGY ELLIPTA 100-62.5-25 MCG/ACT AEPB Inhale 1 puff into the lungs daily. 120 each 0   triamcinolone ointment (KENALOG) 0.1 %      No current facility-administered medications for this visit.    Allergies:   Penicillins and Lisinopril    ROS:  Please see the history of present illness.   Otherwise, review of systems are positive for ***.   All other systems are reviewed and negative.    PHYSICAL EXAM: VS:  There were no vitals taken for this visit. , BMI There is no height or weight on file to calculate BMI. GENERAL:  Well appearing NECK:  No jugular venous distention, waveform within normal limits, carotid upstroke brisk and symmetric, no bruits, no thyromegaly LUNGS:  Clear to auscultation bilaterally CHEST:  Unremarkable HEART:  PMI not displaced or sustained,S1 and S2 within normal limits, no S3, no S4, no clicks, no rubs, *** murmurs ABD:  Flat, positive bowel sounds normal in frequency in pitch, no  bruits, no rebound, no guarding, no midline pulsatile mass, no hepatomegaly, no splenomegaly EXT:  2 plus pulses throughout, no edema, no cyanosis no clubbing    ***GENERAL:  Well appearing NECK:  No jugular venous distention, waveform within normal limits, carotid upstroke brisk and symmetric, no bruits, no thyromegaly LUNGS: Diffuse wheezing CHEST:  Unremarkable HEART:  PMI not displaced or sustained,S1 and S2 within normal limits, no S3, no S4, no clicks, no rubs, no murmurs ABD:  Flat, positive bowel sounds normal  in frequency in pitch, no bruits, no rebound, no guarding, no midline pulsatile mass, no hepatomegaly, no splenomegaly EXT:  2 plus pulses throughout, no edema, no cyanosis no clubbing   EKG:  EKG is *** ordered today. ***  Recent Labs: 01/02/2022: ALT 17; BUN 22; Creatinine, Ser 1.14; Hemoglobin 12.9; Platelets 243.0; Potassium 3.9; Sodium 134; TSH 0.88 02/13/2022: BNP 31.4    Lipid Panel    Component Value Date/Time   CHOL 221 (H) 02/13/2022 0953   TRIG 91 02/13/2022 0953   HDL 52 02/13/2022 0953   CHOLHDL 4.3 02/13/2022 0953   CHOLHDL 4 01/19/2022 1024   VLDL 14.2 01/19/2022 1024   LDLCALC 153 (H) 02/13/2022 0953      Wt Readings from Last 3 Encounters:  02/13/22 213 lb 9.6 oz (96.9 kg)  01/19/22 220 lb (99.8 kg)  01/02/22 219 lb (99.3 kg)      Other studies Reviewed: Additional studies/ records that were reviewed today include:  *** Review of the above records demonstrates:  Please see elsewhere in the note.     ASSESSMENT AND PLAN:  CAD:   ***   I would have no suggestion that he was having any acute coronary syndromes.  He will continue with aggressive risk reduction.  PALPITATIONS:     ***  He previously had some SVT.  However, he is not really complaining of this currently.  No change in therapy.  ISCHEMIC CARDIOMYOPATHY:   *** I will check a BNP but his ejection fraction was actually improved on the echo above.  No further med titration at  this point.  HTN:   The blood pressure is *** low and so no further med titration.   DM (borderline): A1c was *** 6.5.  Therapy per Bradley Grandchild, MD  DYSLIPIDEMIA: LDL  :  *** He needs a lipid profile.  I will draw that today with a goal LDL less than 70   CAROTID STENOSIS: He had very mild plaque.  ***  No change in therapy.    FATIGUE:  ***  I think this is probably related to his lack of sleep.  This will be addressed as below.  COUGH: *** He has an abnormal lung exam.  He has cough.  He has lots of sinus congestion.  We talked about treatment with saline, nasal steroids.  I will refer him to ENT.  Also because of the lung exam and going to check a BNP.  I do not suspect diastolic heart failure.  I will be checking some inflammatory markers and his post-COVID situation with CRP and sed rate.  I will also order pulmonary function testing.   Current medicines are reviewed at length with the patient today.  The patient does not have concerns regarding medicines.  The following changes have been made:  ***   Labs/ tests ordered today include:  ***  No orders of the defined types were placed in this encounter.    Disposition:   FU with me in ***      Signed, Rollene Rotunda, MD  11/08/2022 8:49 PM    [facility]

## 2022-11-09 ENCOUNTER — Ambulatory Visit: Payer: BLUE CROSS/BLUE SHIELD | Attending: Cardiology | Admitting: Cardiology

## 2022-11-09 DIAGNOSIS — R053 Chronic cough: Secondary | ICD-10-CM

## 2022-11-09 DIAGNOSIS — I10 Essential (primary) hypertension: Secondary | ICD-10-CM

## 2022-11-09 DIAGNOSIS — E785 Hyperlipidemia, unspecified: Secondary | ICD-10-CM

## 2022-11-09 DIAGNOSIS — I2510 Atherosclerotic heart disease of native coronary artery without angina pectoris: Secondary | ICD-10-CM

## 2022-11-26 ENCOUNTER — Encounter: Payer: Self-pay | Admitting: Cardiology

## 2022-12-24 ENCOUNTER — Other Ambulatory Visit: Payer: Self-pay

## 2022-12-24 ENCOUNTER — Emergency Department (HOSPITAL_BASED_OUTPATIENT_CLINIC_OR_DEPARTMENT_OTHER)
Admission: EM | Admit: 2022-12-24 | Discharge: 2022-12-24 | Disposition: A | Discharge status: Home / Self Care | Payer: BC Managed Care – PPO | Attending: Emergency Medicine | Admitting: Emergency Medicine

## 2022-12-24 ENCOUNTER — Emergency Department (HOSPITAL_BASED_OUTPATIENT_CLINIC_OR_DEPARTMENT_OTHER): Payer: BC Managed Care – PPO

## 2022-12-24 ENCOUNTER — Encounter (HOSPITAL_BASED_OUTPATIENT_CLINIC_OR_DEPARTMENT_OTHER): Payer: Self-pay | Admitting: Urology

## 2022-12-24 DIAGNOSIS — Z7982 Long term (current) use of aspirin: Secondary | ICD-10-CM | POA: Insufficient documentation

## 2022-12-24 DIAGNOSIS — R630 Anorexia: Secondary | ICD-10-CM | POA: Insufficient documentation

## 2022-12-24 DIAGNOSIS — E119 Type 2 diabetes mellitus without complications: Secondary | ICD-10-CM | POA: Diagnosis not present

## 2022-12-24 DIAGNOSIS — M791 Myalgia, unspecified site: Secondary | ICD-10-CM | POA: Insufficient documentation

## 2022-12-24 DIAGNOSIS — I2510 Atherosclerotic heart disease of native coronary artery without angina pectoris: Secondary | ICD-10-CM | POA: Insufficient documentation

## 2022-12-24 DIAGNOSIS — R079 Chest pain, unspecified: Secondary | ICD-10-CM | POA: Diagnosis not present

## 2022-12-24 DIAGNOSIS — Z1152 Encounter for screening for COVID-19: Secondary | ICD-10-CM | POA: Insufficient documentation

## 2022-12-24 DIAGNOSIS — R5383 Other fatigue: Secondary | ICD-10-CM | POA: Diagnosis not present

## 2022-12-24 DIAGNOSIS — R0789 Other chest pain: Secondary | ICD-10-CM | POA: Diagnosis not present

## 2022-12-24 DIAGNOSIS — R531 Weakness: Secondary | ICD-10-CM | POA: Diagnosis not present

## 2022-12-24 LAB — BASIC METABOLIC PANEL
Anion gap: 7 (ref 5–15)
BUN: 19 mg/dL (ref 8–23)
CO2: 21 mmol/L — ABNORMAL LOW (ref 22–32)
Calcium: 8.6 mg/dL — ABNORMAL LOW (ref 8.9–10.3)
Chloride: 104 mmol/L (ref 98–111)
Creatinine, Ser: 0.96 mg/dL (ref 0.61–1.24)
GFR, Estimated: >60 mL/min (ref >60)
Glucose, Bld: 103 mg/dL — ABNORMAL HIGH (ref 70–99)
Potassium: 4.1 mmol/L (ref 3.5–5.1)
Sodium: 132 mmol/L — ABNORMAL LOW (ref 135–145)

## 2022-12-24 LAB — CBC
HCT: 40 % (ref 39.0–52.0)
Hemoglobin: 13 g/dL (ref 13.0–17.0)
MCH: 28.9 pg (ref 26.0–34.0)
MCHC: 32.5 g/dL (ref 30.0–36.0)
MCV: 88.9 fL (ref 80.0–100.0)
Platelets: 209 10*3/uL (ref 150–400)
RBC: 4.5 MIL/uL (ref 4.22–5.81)
RDW: 15.2 % (ref 11.5–15.5)
WBC: 3.9 10*3/uL — ABNORMAL LOW (ref 4.0–10.5)
nRBC: 0 % (ref 0.0–0.2)

## 2022-12-24 LAB — TROPONIN I (HIGH SENSITIVITY)
Troponin I (High Sensitivity): 7 ng/L (ref <18)
Troponin I (High Sensitivity): 8 ng/L (ref <18)

## 2022-12-24 LAB — RESP PANEL BY RT-PCR (RSV, FLU A&B, COVID)  RVPGX2
Influenza A by PCR: NEGATIVE
Influenza B by PCR: NEGATIVE
Resp Syncytial Virus by PCR: NEGATIVE
SARS Coronavirus 2 by RT PCR: NEGATIVE

## 2022-12-24 MED ORDER — SODIUM CHLORIDE 0.9 % IV BOLUS
500.0000 mL | Freq: Once | INTRAVENOUS | Status: AC
  Administered 2022-12-24: 500 mL via INTRAVENOUS

## 2022-12-24 NOTE — ED Provider Notes (Signed)
[facility] EMERGENCY DEPARTMENT Provider Note   CSN: [id number] Arrival date & time: 12/24/22  1214     History  Chief Complaint  Bradley Watson presents with   Fatigue   Chest Pain    Bradley Watson is a 62 y.o. male.  Bradley Watson here with generalized fatigue last few days.  Viral type symptoms with body aches and chest pain and decreased appetite.  History of high cholesterol, diabetes, coronary artery disease.  Not having any active chest pain.  Denies any exertional chest pain or shortness of breath or recent surgery or travel.  No leg swelling.  No diarrhea.  Not much of a cough or sputum production.  Nothing makes it worse or better.  The history is provided by the Bradley Watson.       Home Medications Prior to Admission medications   Medication Sig Start Date End Date Taking? Authorizing Provider  albuterol (VENTOLIN HFA) 108 (90 Base) MCG/ACT inhaler Inhale 2 puffs into the lungs every 6 (six) hours as needed for wheezing or shortness of breath. Bradley Watson not taking: Reported on 02/13/2022 10/08/20   Pincus Sanes, MD  ASPIRIN CHILDRENS 81 MG chewable tablet Chew by mouth. 07/22/20   [provider]  brimonidine (ALPHAGAN) 0.2 % ophthalmic solution Place 1 drop into both eyes 3 (three) times daily. Bradley Watson taking differently: Place 1 drop into both eyes 2 (two) times daily. 07/28/19   Rennis Chris, MD  carvedilol (COREG) 3.125 MG tablet Take 1 tablet (3.125 mg total) by mouth 2 (two) times daily. 01/01/22 04/01/22  Rollene Rotunda, MD  dorzolamide (TRUSOPT) 2 % ophthalmic solution Place 1 drop into both eyes daily.    [provider]  DUPIXENT 300 MG/2ML SOPN Inject into the skin. 02/09/22   [provider]  ezetimibe (ZETIA) 10 MG tablet Take 1 tablet (10 mg total) by mouth daily. 02/17/22 05/18/22  Rollene Rotunda, MD  losartan (COZAAR) 25 MG tablet Take 1 tablet (25 mg total) by mouth daily. 02/28/21 01/01/22  Rollene Rotunda, MD  omeprazole (PRILOSEC) 20  MG capsule Take 2 tabs daily 30 minutes before a meal for 2 weeks and then 1 tab daily 30 minutes before a meal Bradley Watson not taking: Reported on 02/13/2022 10/08/20   Pincus Sanes, MD  rosuvastatin (CRESTOR) 40 MG tablet Take 1 tablet (40 mg total) by mouth daily. 01/19/22   [name], MD  thiamine 100 MG tablet Take 1 tablet (100 mg total) by mouth daily. 01/07/22   [name], MD  TRELEGY ELLIPTA 100-62.5-25 MCG/ACT AEPB Inhale 1 puff into the lungs daily. 12/16/21   [name], MD  triamcinolone ointment (KENALOG) 0.1 %  05/04/19   [provider]      Allergies    Penicillins and Lisinopril    Review of Systems   Review of Systems  Physical Exam Updated Vital Signs BP (!) 168/91   Pulse (!) 49   Temp 97.6 F (36.4 C)   Resp 15   Ht 6' (1.829 m)   Wt 96.9 kg   SpO2 100%   BMI 28.97 kg/m  Physical Exam Vitals and nursing note reviewed.  Constitutional:      General: Bradley Watson is not in acute distress.    Appearance: Bradley Watson is well-developed. Bradley Watson is not ill-appearing.  HENT:     Head: Normocephalic and atraumatic.  Eyes:     Extraocular Movements: Extraocular movements intact.     Conjunctiva/sclera: Conjunctivae normal.     Pupils:  Pupils are equal, round, and reactive to light.  Cardiovascular:     Rate and Rhythm: Normal rate and regular rhythm.     Pulses:          Radial pulses are 2+ on the right side and 2+ on the left side.     Heart sounds: Normal heart sounds. No murmur heard. Pulmonary:     Effort: Pulmonary effort is normal. No respiratory distress.     Breath sounds: Normal breath sounds.  Abdominal:     Palpations: Abdomen is soft.     Tenderness: There is no abdominal tenderness.  Musculoskeletal:        General: No swelling.     Cervical back: Normal range of motion and neck supple.     Right lower leg: No edema.     Left lower leg: No edema.  Skin:    General: Skin is warm and dry.     Capillary Refill: Capillary refill takes less  than 2 seconds.  Neurological:     Mental Status: Bradley Watson is alert.  Psychiatric:        Mood and Affect: Mood normal.     ED Results / Procedures / Treatments   Labs (all labs ordered are listed, but only abnormal results are displayed) Labs Reviewed  BASIC METABOLIC PANEL - Abnormal; Notable for the following components:      Result Value   Sodium 132 (*)    CO2 21 (*)    Glucose, Bld 103 (*)    Calcium 8.6 (*)    All other components within normal limits  CBC - Abnormal; Notable for the following components:   WBC 3.9 (*)    All other components within normal limits  RESP PANEL BY RT-PCR (RSV, FLU A&B, COVID)  RVPGX2  TROPONIN I (HIGH SENSITIVITY)  TROPONIN I (HIGH SENSITIVITY)    EKG EKG Interpretation  Date/Time:  Thursday December 24 2022 12:34:42 EST Ventricular Rate:  57 PR Interval:  184 QRS Duration: 106 QT Interval:  421 QTC Calculation: 410 R Axis:   28 Text Interpretation: Sinus rhythm Confirmed by Lennice Sites ([id number]) on 12/24/2022 3:44:47 PM  Radiology DG Chest 2 View  Result Date: 12/24/2022 CLINICAL DATA:  Chest pressure. EXAM: CHEST - 2 VIEW COMPARISON:  Chest two views 01/02/2022 FINDINGS: Status post median sternotomy. Cardiac silhouette and mediastinal contours are within normal limits. The lungs are clear. No pleural effusion or pneumothorax. Moderate multilevel degenerative disc changes of the upper thoracic spine. IMPRESSION: No active cardiopulmonary disease. Electronically Signed   By: Yvonne Kendall M.D.   On: 12/24/2022 12:58    Procedures Procedures    Medications Ordered in ED Medications  sodium chloride 0.9 % bolus 500 mL (500 mLs Intravenous New Bag/Given 12/24/22 1642)    ED Course/ Medical Decision Making/ A&P                           Medical Decision Making Amount and/or Complexity of Data Reviewed Labs: ordered. Radiology: ordered.   Bradley Watson is here with generalized weakness.  Normal vitals.  No fever.  EKG shows sinus  rhythm.  No ischemic changes.  History of CAD, diabetes, diverticulitis.  Differential diagnosis likely viral process versus less likely ACS, pneumonia, electrolyte abnormality.  Will get CBC, BMP, troponin, COVID/flu test, chest x-ray.  Will give fluid bolus.  Troponin negative x 2 per my review and interpretation of labs.  Otherwise no significant anemia  or electrolyte abnormality or kidney injury.  Chest x-ray with no evidence of pneumonia or pneumothorax.  COVID and flu test are negative.  Overall suspect likely viral process.  Recommend continued hydration.  Discharged in good condition.  Have no concern for ACS or PE other acute pulmonary or cardiac issue.  Understands return precautions.  This chart was dictated using voice recognition software.  Despite best efforts to proofread,  errors can occur which can change the documentation meaning.         Final Clinical Impression(s) / ED Diagnoses Final diagnoses:  Weakness    Rx / DC Orders ED Discharge Orders     None         Lennice Sites, DO 12/24/22 1720

## 2022-12-24 NOTE — ED Notes (Signed)
Pt. Reports he has had some mid sternal chest discomfort with feeling very tired for approx. 10 days.  Pt. In no distress and has no reports of nausea or vomiting.  Pt. Has heart. History.

## 2022-12-24 NOTE — ED Triage Notes (Signed)
Pt states fatigue, chest pressure, and "feeling bad", reports chills x 4 days  Denies fever  Exposure to COVID at work    H/o CABG

## 2023-02-17 ENCOUNTER — Other Ambulatory Visit: Payer: Self-pay | Admitting: Cardiology

## 2023-02-17 DIAGNOSIS — E785 Hyperlipidemia, unspecified: Secondary | ICD-10-CM

## 2023-06-08 DIAGNOSIS — L2089 Other atopic dermatitis: Secondary | ICD-10-CM | POA: Diagnosis not present

## 2023-08-11 DIAGNOSIS — H52203 Unspecified astigmatism, bilateral: Secondary | ICD-10-CM | POA: Diagnosis not present

## 2023-08-11 DIAGNOSIS — H401131 Primary open-angle glaucoma, bilateral, mild stage: Secondary | ICD-10-CM | POA: Diagnosis not present

## 2023-08-11 DIAGNOSIS — H31003 Unspecified chorioretinal scars, bilateral: Secondary | ICD-10-CM | POA: Diagnosis not present

## 2023-08-11 DIAGNOSIS — H2512 Age-related nuclear cataract, left eye: Secondary | ICD-10-CM | POA: Diagnosis not present

## 2023-08-11 DIAGNOSIS — H1789 Other corneal scars and opacities: Secondary | ICD-10-CM | POA: Diagnosis not present

## 2023-08-11 DIAGNOSIS — H5213 Myopia, bilateral: Secondary | ICD-10-CM | POA: Diagnosis not present

## 2023-09-24 ENCOUNTER — Other Ambulatory Visit: Payer: Self-pay | Admitting: Internal Medicine

## 2023-09-24 DIAGNOSIS — Z1211 Encounter for screening for malignant neoplasm of colon: Secondary | ICD-10-CM

## 2023-09-24 DIAGNOSIS — Z1212 Encounter for screening for malignant neoplasm of rectum: Secondary | ICD-10-CM

## 2023-10-01 ENCOUNTER — Other Ambulatory Visit: Payer: Self-pay | Admitting: Cardiology

## 2023-12-18 ENCOUNTER — Other Ambulatory Visit: Payer: Self-pay

## 2023-12-18 ENCOUNTER — Emergency Department (HOSPITAL_BASED_OUTPATIENT_CLINIC_OR_DEPARTMENT_OTHER)
Admission: EM | Admit: 2023-12-18 | Discharge: 2023-12-18 | Disposition: A | Discharge status: Home / Self Care | Payer: BC Managed Care – PPO | Attending: Emergency Medicine | Admitting: Emergency Medicine

## 2023-12-18 ENCOUNTER — Emergency Department (HOSPITAL_BASED_OUTPATIENT_CLINIC_OR_DEPARTMENT_OTHER): Payer: BC Managed Care – PPO

## 2023-12-18 ENCOUNTER — Encounter (HOSPITAL_BASED_OUTPATIENT_CLINIC_OR_DEPARTMENT_OTHER): Payer: Self-pay

## 2023-12-18 DIAGNOSIS — J029 Acute pharyngitis, unspecified: Secondary | ICD-10-CM | POA: Diagnosis not present

## 2023-12-18 DIAGNOSIS — Z20822 Contact with and (suspected) exposure to covid-19: Secondary | ICD-10-CM | POA: Insufficient documentation

## 2023-12-18 DIAGNOSIS — R059 Cough, unspecified: Secondary | ICD-10-CM | POA: Diagnosis not present

## 2023-12-18 DIAGNOSIS — I2510 Atherosclerotic heart disease of native coronary artery without angina pectoris: Secondary | ICD-10-CM | POA: Insufficient documentation

## 2023-12-18 DIAGNOSIS — Z7982 Long term (current) use of aspirin: Secondary | ICD-10-CM | POA: Insufficient documentation

## 2023-12-18 DIAGNOSIS — E119 Type 2 diabetes mellitus without complications: Secondary | ICD-10-CM | POA: Insufficient documentation

## 2023-12-18 LAB — RESP PANEL BY RT-PCR (RSV, FLU A&B, COVID)  RVPGX2
Influenza A by PCR: NEGATIVE
Influenza B by PCR: NEGATIVE
Resp Syncytial Virus by PCR: NEGATIVE
SARS Coronavirus 2 by RT PCR: NEGATIVE

## 2023-12-18 MED ORDER — AMOXICILLIN 500 MG PO CAPS: 500.0000 mg | ORAL_CAPSULE | Freq: Three times a day (TID) | ORAL | 0 refills | Status: DC

## 2023-12-18 MED ORDER — BENZONATATE 100 MG PO CAPS: 100.0000 mg | ORAL_CAPSULE | Freq: Three times a day (TID) | ORAL | 0 refills | Status: DC

## 2023-12-18 NOTE — ED Triage Notes (Signed)
The pateint having cough and sore throat for one week.

## 2023-12-18 NOTE — Discharge Instructions (Signed)
You were seen in the emergency room today for cough and sore throat.  I sent medication to your pharmacy please take as prescribed make sure you are staying well-hydrated lots of water you can alternate Pedialyte and Gatorade.  If you have fever or muscle aches you can take Tylenol or ibuprofen.  Continue using over-the-counter cough drops, I would also recommend warm tea with honey and lemon.  Follow-up with primary care to ensure resolution of symptoms.

## 2023-12-18 NOTE — ED Provider Notes (Signed)
EMERGENCY DEPARTMENT AT [facility] Provider Note   CSN: [id number] Arrival date & time: 12/18/23  9562     History  Chief Complaint  Patient presents with   Cough   Sore Throat    Bradley Watson is a 62 y.o. male patient with past medical history of coronary artery disease, hyperlipidemia, diabetes presenting to emergency room with 1 week of dry cough and sore throat.  Patient has had intermittent chills but denies any known fever.  Has normal appetite.  Denies any abdominal pain nausea vomiting diarrhea.  Patient does not have productive cough.  Denies chest pain or shortness of breath.  Patient reports he has not been able to go to work for a week because he has been feeling sick.  Has been trying over-the-counter cough drops.   Cough Sore Throat       Home Medications Prior to Admission medications   Medication Sig Start Date End Date Taking? Authorizing Provider  albuterol (VENTOLIN HFA) 108 (90 Base) MCG/ACT inhaler Inhale 2 puffs into the lungs every 6 (six) hours as needed for wheezing or shortness of breath. Patient not taking: Reported on 02/13/2022 10/08/20   Pincus Sanes, MD  ASPIRIN CHILDRENS 81 MG chewable tablet Chew by mouth. 07/22/20   [provider]  brimonidine (ALPHAGAN) 0.2 % ophthalmic solution Place 1 drop into both eyes 3 (three) times daily. Patient taking differently: Place 1 drop into both eyes 2 (two) times daily. 07/28/19   Rennis Chris, MD  carvedilol (COREG) 3.125 MG tablet TAKE 1 TABLET(3.125 MG) BY MOUTH TWICE DAILY 10/04/23   Rollene Rotunda, MD  dorzolamide (TRUSOPT) 2 % ophthalmic solution Place 1 drop into both eyes daily.    [provider]  DUPIXENT 300 MG/2ML SOPN Inject into the skin. 02/09/22   [provider]  ezetimibe (ZETIA) 10 MG tablet TAKE 1 TABLET(10 MG) BY MOUTH DAILY 02/17/23   Rollene Rotunda, MD  losartan (COZAAR) 25 MG tablet Take 1 tablet (25 mg total) by mouth daily. 02/28/21  01/01/22  Rollene Rotunda, MD  omeprazole (PRILOSEC) 20 MG capsule Take 2 tabs daily 30 minutes before a meal for 2 weeks and then 1 tab daily 30 minutes before a meal Patient not taking: Reported on 02/13/2022 10/08/20   Pincus Sanes, MD  rosuvastatin (CRESTOR) 40 MG tablet Take 1 tablet (40 mg total) by mouth daily. 01/19/22   [name], MD  thiamine 100 MG tablet Take 1 tablet (100 mg total) by mouth daily. 01/07/22   [name], MD  TRELEGY ELLIPTA 100-62.5-25 MCG/ACT AEPB Inhale 1 puff into the lungs daily. 12/16/21   [name], MD  triamcinolone ointment (KENALOG) 0.1 %  05/04/19   [provider]      Allergies    Penicillins and Lisinopril    Review of Systems   Review of Systems  Respiratory:  Positive for cough.     Physical Exam Updated Vital Signs BP (!) 172/108   Pulse (!) 58   Temp 98.2 F (36.8 C) (Oral)   Resp 16   Ht 6' (1.829 m)   Wt 69 kg   SpO2 98%   BMI 20.63 kg/m  Physical Exam Vitals and nursing note reviewed.  Constitutional:      General: He is not in acute distress.    Appearance: He is not toxic-appearing.  HENT:     Head: Normocephalic and atraumatic.     Mouth/Throat:  Pharynx: Posterior oropharyngeal erythema present. No oropharyngeal exudate.  Eyes:     General: No scleral icterus.    Conjunctiva/sclera: Conjunctivae normal.  Cardiovascular:     Rate and Rhythm: Normal rate and regular rhythm.     Pulses: Normal pulses.     Heart sounds: Normal heart sounds.  Pulmonary:     Effort: Pulmonary effort is normal. No respiratory distress.     Breath sounds: Normal breath sounds.  Chest:     Chest wall: No tenderness.  Abdominal:     General: Abdomen is flat. Bowel sounds are normal.     Palpations: Abdomen is soft.     Tenderness: There is no abdominal tenderness.  Musculoskeletal:     Right lower leg: No edema.     Left lower leg: No edema.  Skin:    General: Skin is warm and dry.     Findings: No lesion.   Neurological:     General: No focal deficit present.     Mental Status: He is alert and oriented to person, place, and time. Mental status is at baseline.     ED Results / Procedures / Treatments   Labs (all labs ordered are listed, but only abnormal results are displayed) Labs Reviewed  RESP PANEL BY RT-PCR (RSV, FLU A&B, COVID)  RVPGX2    EKG None  Radiology DG Chest 2 View Result Date: 12/18/2023 CLINICAL DATA:  62 year old male with history of cough and sore throat for 1 week. EXAM: CHEST - 2 VIEW COMPARISON:  Chest x-ray 12/24/2022. FINDINGS: Lung volumes are normal. No consolidative airspace disease. No pleural effusions. No pneumothorax. No pulmonary nodule or mass noted. Pulmonary vasculature and the cardiomediastinal silhouette are within normal limits. Status post median sternotomy for CABG. IMPRESSION: 1.  No radiographic evidence of acute cardiopulmonary disease. Electronically Signed   By: Trudie Reed M.D.   On: 12/18/2023 10:16    Procedures Procedures    Medications Ordered in ED Medications - No data to display  ED Course/ Medical Decision Making/ A&P                                 Medical Decision Making Amount and/or Complexity of Data Reviewed Radiology: ordered.   Bradley Watson 62 y.o. presented today for URI like symptoms. Working DDx that I considered at this time includes, but not limited to, viral illness, pharyngitis, mono, sinusitis, electrolyte abnormality, AOM.  R/o DDx: these additional diagnoses are not consistent with patient's history, presentation, physical exam, labs/imaging findings.  Review of prior external notes: None  Labs:  Respiratory Panel: Neg   Imaging:  Chest x-ray without any consolidation.  Problem List / ED Course / Critical interventions / Medication management  Presented to emergency room with cough and sore throat.  Cough has been dry nonproductive.  No associated chest pain or shortness of breath.  Has  reported some chills but denies fever.  Respiratory panel is negative.  Patient does have erythematous throat but no swelling or exudate.  Uvula midline.  Also patient on antibiotics due to duration of symptoms and encouraged over-the-counter management.  Patient will return if any new or worsening symptoms and follow-up with primary care in the meantime. I will send amoxicillin and Tessalon Perles to pharmacy due to duration of symptoms.  However no pneumonia on chest x-ray.  Patients vitals assessed. Upon arrival patient is hemodynamically stable.  I have reviewed the  patients home medicines and have made adjustments as needed     Plan:  F/u w/ PCP in 2-3d to ensure resolution of sx.  Patient was given return precautions. Patient stable for discharge at this time.  Patient educated on sx and dx and verbalized understanding of plan. Return to ER if new or worsening sx.          Final Clinical Impression(s) / ED Diagnoses Final diagnoses:  Pharyngitis, unspecified etiology    Rx / DC Orders ED Discharge Orders     None         Smitty Knudsen, PA-C 12/18/23 1105    Tegeler, Canary Brim, MD 12/18/23 1259

## 2023-12-26 LAB — COLOGUARD

## 2024-01-30 ENCOUNTER — Other Ambulatory Visit: Payer: Self-pay | Admitting: Cardiology

## 2024-01-30 DIAGNOSIS — E785 Hyperlipidemia, unspecified: Secondary | ICD-10-CM

## 2024-02-06 LAB — COLOGUARD: COLOGUARD: NEGATIVE

## 2024-02-10 ENCOUNTER — Telehealth: Payer: Self-pay | Admitting: Internal Medicine

## 2024-02-10 NOTE — Telephone Encounter (Signed)
Copied from CRM [id number]. Topic: General - Other >> Feb 10, 2024  1:24 PM [place] A wrote: Reason for CRM: Patient would like results for [facility]-please call on cell number

## 2024-02-14 NOTE — Telephone Encounter (Signed)
 Unable to reach patient. Unable to leave message to return call.

## 2024-02-14 NOTE — Telephone Encounter (Signed)
Patient hasn't been seen in >2 years.

## 2024-03-29 ENCOUNTER — Other Ambulatory Visit: Payer: Self-pay | Admitting: Cardiology

## 2024-03-30 NOTE — Telephone Encounter (Signed)
 Dr. Jenene Slicker pt. He is passed his 3rd attempt. Does Dr. Antoine Poche want to refill? Please advise

## 2024-04-04 NOTE — Telephone Encounter (Signed)
 RN sent six pills for refill. Unable to contact patient x2. Patient has received 3 notice about refilling medication and needing an appointment  Last visit was 02/13/22 with Dr Janifer Meigs

## 2024-04-04 NOTE — Telephone Encounter (Signed)
Voicemail is full unable to leave message

## 2024-04-04 NOTE — Telephone Encounter (Signed)
Unable to leave message on home phone 

## 2024-10-09 ENCOUNTER — Ambulatory Visit: Admitting: Internal Medicine

## 2024-10-09 ENCOUNTER — Encounter: Payer: Self-pay | Admitting: Internal Medicine

## 2024-10-09 VITALS — BP 146/100 | HR 95 | Temp 98.5°F | Resp 16 | Ht 72.0 in | Wt 212.0 lb

## 2024-10-09 DIAGNOSIS — E118 Type 2 diabetes mellitus with unspecified complications: Secondary | ICD-10-CM

## 2024-10-09 DIAGNOSIS — E785 Hyperlipidemia, unspecified: Secondary | ICD-10-CM

## 2024-10-09 DIAGNOSIS — I10 Essential (primary) hypertension: Secondary | ICD-10-CM

## 2024-10-09 DIAGNOSIS — D520 Dietary folate deficiency anemia: Secondary | ICD-10-CM

## 2024-10-09 DIAGNOSIS — Z0000 Encounter for general adult medical examination without abnormal findings: Secondary | ICD-10-CM

## 2024-10-09 DIAGNOSIS — K219 Gastro-esophageal reflux disease without esophagitis: Secondary | ICD-10-CM | POA: Diagnosis not present

## 2024-10-09 DIAGNOSIS — I255 Ischemic cardiomyopathy: Secondary | ICD-10-CM | POA: Diagnosis not present

## 2024-10-09 DIAGNOSIS — E519 Thiamine deficiency, unspecified: Secondary | ICD-10-CM | POA: Diagnosis not present

## 2024-10-09 DIAGNOSIS — L309 Dermatitis, unspecified: Secondary | ICD-10-CM | POA: Insufficient documentation

## 2024-10-09 DIAGNOSIS — I2510 Atherosclerotic heart disease of native coronary artery without angina pectoris: Secondary | ICD-10-CM

## 2024-10-09 DIAGNOSIS — K5792 Diverticulitis of intestine, part unspecified, without perforation or abscess without bleeding: Secondary | ICD-10-CM | POA: Insufficient documentation

## 2024-10-09 DIAGNOSIS — L2084 Intrinsic (allergic) eczema: Secondary | ICD-10-CM

## 2024-10-09 LAB — CBC WITH DIFFERENTIAL/PLATELET
Basophils Absolute: 0 K/uL (ref 0.0–0.1)
Basophils Relative: 0.6 % (ref 0.0–3.0)
Eosinophils Absolute: 0.1 K/uL (ref 0.0–0.7)
Eosinophils Relative: 1.5 % (ref 0.0–5.0)
HCT: 42.3 % (ref 39.0–52.0)
Hemoglobin: 13.8 g/dL (ref 13.0–17.0)
Lymphocytes Relative: 37 % (ref 12.0–46.0)
Lymphs Abs: 1.5 K/uL (ref 0.7–4.0)
MCHC: 32.7 g/dL (ref 30.0–36.0)
MCV: 89.7 fl (ref 78.0–100.0)
Monocytes Absolute: 0.5 K/uL (ref 0.1–1.0)
Monocytes Relative: 11.7 % (ref 3.0–12.0)
Neutro Abs: 2 K/uL (ref 1.4–7.7)
Neutrophils Relative %: 49.2 % (ref 43.0–77.0)
Platelets: 236 K/uL (ref 150.0–400.0)
RBC: 4.72 Mil/uL (ref 4.22–5.81)
RDW: 16 % — ABNORMAL HIGH (ref 11.5–15.5)
WBC: 4 K/uL (ref 4.0–10.5)

## 2024-10-09 LAB — URINALYSIS, ROUTINE W REFLEX MICROSCOPIC
Bilirubin Urine: NEGATIVE
Ketones, ur: NEGATIVE
Leukocytes,Ua: NEGATIVE
Nitrite: NEGATIVE
RBC / HPF: NONE SEEN (ref 0–?)
Specific Gravity, Urine: 1.02 (ref 1.000–1.030)
Total Protein, Urine: NEGATIVE
Urine Glucose: NEGATIVE
Urobilinogen, UA: 0.2 (ref 0.0–1.0)
pH: 6 (ref 5.0–8.0)

## 2024-10-09 LAB — BASIC METABOLIC PANEL WITH GFR
BUN: 16 mg/dL (ref 6–23)
CO2: 27 meq/L (ref 19–32)
Calcium: 9 mg/dL (ref 8.4–10.5)
Chloride: 102 meq/L (ref 96–112)
Creatinine, Ser: 0.99 mg/dL (ref 0.40–1.50)
GFR: 79.83 mL/min (ref >60.00)
Glucose, Bld: 94 mg/dL (ref 70–99)
Potassium: 4.1 meq/L (ref 3.5–5.1)
Sodium: 138 meq/L (ref 135–145)

## 2024-10-09 LAB — VITAMIN B12: Vitamin B-12: 824 pg/mL (ref 211–911)

## 2024-10-09 LAB — MICROALBUMIN / CREATININE URINE RATIO
Creatinine,U: 125.9 mg/dL
Microalb Creat Ratio: 18.6 mg/g (ref 0.0–30.0)
Microalb, Ur: 2.3 mg/dL — ABNORMAL HIGH (ref 0.0–1.9)

## 2024-10-09 LAB — HEPATIC FUNCTION PANEL
ALT: 10 U/L (ref 0–53)
AST: 16 U/L (ref 0–37)
Albumin: 4.6 g/dL (ref 3.5–5.2)
Alkaline Phosphatase: 69 U/L (ref 39–117)
Bilirubin, Direct: 0.1 mg/dL (ref 0.0–0.3)
Total Bilirubin: 0.7 mg/dL (ref 0.2–1.2)
Total Protein: 7.4 g/dL (ref 6.0–8.3)

## 2024-10-09 LAB — PSA: PSA: 0.58 ng/mL (ref 0.10–4.00)

## 2024-10-09 LAB — LIPID PANEL
Cholesterol: 315 mg/dL — ABNORMAL HIGH (ref 0–200)
HDL: 76.9 mg/dL (ref >39.00)
LDL Cholesterol: 220 mg/dL — ABNORMAL HIGH (ref 0–99)
NonHDL: 237.9
Total CHOL/HDL Ratio: 4
Triglycerides: 90 mg/dL (ref 0.0–149.0)
VLDL: 18 mg/dL (ref 0.0–40.0)

## 2024-10-09 LAB — TSH: TSH: 1.56 u[IU]/mL (ref 0.35–5.50)

## 2024-10-09 LAB — HEMOGLOBIN A1C: Hgb A1c MFr Bld: 6 % (ref 4.6–6.5)

## 2024-10-09 MED ORDER — COVID-19 MRNA VAC-TRIS(PFIZER) 30 MCG/0.3ML IM SUSY: 0.3000 mL | PREFILLED_SYRINGE | Freq: Once | INTRAMUSCULAR | 0 refills | Status: AC

## 2024-10-09 MED ORDER — TRIAMCINOLONE ACETONIDE 0.1 % EX OINT: TOPICAL_OINTMENT | Freq: Three times a day (TID) | CUTANEOUS | 1 refills | Status: AC

## 2024-10-09 NOTE — Patient Instructions (Signed)
 Health Maintenance, Male  Adopting a healthy lifestyle and getting preventive care are important in promoting health and wellness. Ask your health care provider about:  The right schedule for you to have regular tests and exams.  Things you can do on your own to prevent diseases and keep yourself healthy.  What should I know about diet, weight, and exercise?  Eat a healthy diet    Eat a diet that includes plenty of vegetables, fruits, low-fat dairy products, and lean protein.  Do not eat a lot of foods that are high in solid fats, added sugars, or sodium.  Maintain a healthy weight  Body mass index (BMI) is a measurement that can be used to identify possible weight problems. It estimates body fat based on height and weight. Your health care provider can help determine your BMI and help you achieve or maintain a healthy weight.  Get regular exercise  Get regular exercise. This is one of the most important things you can do for your health. Most adults should:  Exercise for at least 150 minutes each week. The exercise should increase your heart rate and make you sweat (moderate-intensity exercise).  Do strengthening exercises at least twice a week. This is in addition to the moderate-intensity exercise.  Spend less time sitting. Even light physical activity can be beneficial.  Watch cholesterol and blood lipids  Have your blood tested for lipids and cholesterol at 65 years of age, then have this test every 5 years.  You may need to have your cholesterol levels checked more often if:  Your lipid or cholesterol levels are high.  You are older than 65 years of age.  You are at high risk for heart disease.  What should I know about cancer screening?  Many types of cancers can be detected early and may often be prevented. Depending on your health history and family history, you may need to have cancer screening at various ages. This may include screening for:  Colorectal cancer.  Prostate cancer.  Skin cancer.  Lung  cancer.  What should I know about heart disease, diabetes, and high blood pressure?  Blood pressure and heart disease  High blood pressure causes heart disease and increases the risk of stroke. This is more likely to develop in people who have high blood pressure readings or are overweight.  Talk with your health care provider about your target blood pressure readings.  Have your blood pressure checked:  Every 3-5 years if you are 24-52 years of age.  Every year if you are 3 years old or older.  If you are between the ages of 60 and 72 and are a current or former smoker, ask your health care provider if you should have a one-time screening for abdominal aortic aneurysm (AAA).  Diabetes  Have regular diabetes screenings. This checks your fasting blood sugar level. Have the screening done:  Once every three years after age 66 if you are at a normal weight and have a low risk for diabetes.  More often and at a younger age if you are overweight or have a high risk for diabetes.  What should I know about preventing infection?  Hepatitis B  If you have a higher risk for hepatitis B, you should be screened for this virus. Talk with your health care provider to find out if you are at risk for hepatitis B infection.  Hepatitis C  Blood testing is recommended for:  Everyone born from [birth year] through 1967.  Anyone  with known risk factors for hepatitis C.  Sexually transmitted infections (STIs)  You should be screened each year for STIs, including gonorrhea and chlamydia, if:  You are sexually active and are younger than 65 years of age.  You are older than 65 years of age and your health care provider tells you that you are at risk for this type of infection.  Your sexual activity has changed since you were last screened, and you are at increased risk for chlamydia or gonorrhea. Ask your health care provider if you are at risk.  Ask your health care provider about whether you are at high risk for HIV. Your health care provider  may recommend a prescription medicine to help prevent HIV infection. If you choose to take medicine to prevent HIV, you should first get tested for HIV. You should then be tested every 3 months for as long as you are taking the medicine.  Follow these instructions at home:  Alcohol use  Do not drink alcohol if your health care provider tells you not to drink.  If you drink alcohol:  Limit how much you have to 0-2 drinks a day.  Know how much alcohol is in your drink. In the [place] one drink equals one 12 oz bottle of beer (355 mL), one 5 oz glass of wine (148 mL), or one 1 oz glass of hard liquor (44 mL).  Lifestyle  Do not use any products that contain nicotine or tobacco. These products include cigarettes, chewing tobacco, and vaping devices, such as e-cigarettes. If you need help quitting, ask your health care provider.  Do not use street drugs.  Do not share needles.  Ask your health care provider for help if you need support or information about quitting drugs.  General instructions  Schedule regular health, dental, and eye exams.  Stay current with your vaccines.  Tell your health care provider if:  You often feel depressed.  You have ever been abused or do not feel safe at home.  Summary  Adopting a healthy lifestyle and getting preventive care are important in promoting health and wellness.  Follow your health care provider's instructions about healthy diet, exercising, and getting tested or screened for diseases.  Follow your health care provider's instructions on monitoring your cholesterol and blood pressure.  This information is not intended to replace advice given to you by your health care provider. Make sure you discuss any questions you have with your health care provider.  Document Revised: 04/28/2021 Document Reviewed: 04/28/2021  Elsevier Patient Education  2024 [employer].

## 2024-10-09 NOTE — Progress Notes (Unsigned)
 Subjective:  Patient ID: Bradley Watson, male    DOB: 03/17/1961  Age: 63 y.o. MRN: [id number]  CC: Medical Management of Chronic Issues   HPI DAVIDMICHAEL Watson presents for a CPX and f/up ---  Discussed the use of AI scribe software for clinical note transcription with the patient, who gave verbal consent to proceed.  History of Present Illness Bradley Watson is a 63 year old male with hypertension who presents with medication refill and irregular heartbeats. He is accompanied by his wife, who has dementia.  He has run out of his blood pressure medications, which he refers to as 'Zantibia' and 'Cardivolol', in the last few days. He is unaware of his current blood pressure levels. He does not monitor his blood sugar and has not seen a cardiologist in the last two years.  He experiences occasional irregular heartbeats, approximately once every two or three days. He also has chest pain described as a burning sensation when walking his dog, which sometimes necessitates returning home to rest. No regular shortness of breath, but the chest pain occurs 'not all the time'. No chest pain, shortness of breath, or palpitations at rest.  His vision has worsened, possibly due to stretching out his medication, and he has not had an eye exam in about a year. He is currently managing significant stress due to his wife's dementia and his work schedule, which involves working ten hours a day, five days a week. Despite this, he reports sleeping well, although he wakes up early to manage household tasks.  He has lost approximately eight pounds recently, though he does not report any specific efforts to lose weight.     Outpatient Medications Prior to Visit  Medication Sig Dispense Refill   amoxicillin  (AMOXIL ) 500 MG capsule Take 1 capsule (500 mg total) by mouth 3 (three) times daily. 21 capsule 0   ASPIRIN  CHILDRENS 81 MG chewable tablet Chew by mouth.     carvedilol  (COREG ) 3.125 MG tablet Take 1  tablet (3.125 mg total) by mouth 2 (two) times daily. Please call for and appointment with cardiologist to receive a complete prescription . 6 tablet 0   dorzolamide (TRUSOPT) 2 % ophthalmic solution Place 1 drop into both eyes daily.     albuterol  (VENTOLIN  HFA) 108 (90 Base) MCG/ACT inhaler Inhale 2 puffs into the lungs every 6 (six) hours as needed for wheezing or shortness of breath. (Patient not taking: Reported on 10/09/2024) 8 g 2   ezetimibe  (ZETIA ) 10 MG tablet TAKE 1 TABLET(10 MG) BY MOUTH DAILY (Patient not taking: Reported on 10/09/2024) 15 tablet 0   losartan  (COZAAR ) 25 MG tablet Take 1 tablet (25 mg total) by mouth daily. (Patient not taking: Reported on 10/09/2024) 30 tablet 12   omeprazole  (PRILOSEC) 20 MG capsule Take 2 tabs daily 30 minutes before a meal for 2 weeks and then 1 tab daily 30 minutes before a meal (Patient not taking: Reported on 10/09/2024) 60 capsule 3   rosuvastatin  (CRESTOR ) 40 MG tablet Take 1 tablet (40 mg total) by mouth daily. (Patient not taking: Reported on 10/09/2024) 90 tablet 1   TRELEGY ELLIPTA  100-62.5-25 MCG/ACT AEPB Inhale 1 puff into the lungs daily. (Patient not taking: Reported on 10/09/2024) 120 each 0   benzonatate  (TESSALON ) 100 MG capsule Take 1 capsule (100 mg total) by mouth every 8 (eight) hours. 21 capsule 0   brimonidine  (ALPHAGAN ) 0.2 % ophthalmic solution Place 1 drop into both eyes 3 (three) times daily. (Patient  taking differently: Place 1 drop into both eyes 2 (two) times daily.) 10 mL 10   DUPIXENT 300 MG/2ML SOPN Inject into the skin.     thiamine  100 MG tablet Take 1 tablet (100 mg total) by mouth daily. 90 tablet 1   triamcinolone  ointment (KENALOG ) 0.1 %  (Patient not taking: Reported on 10/09/2024)     No facility-administered medications prior to visit.    ROS Review of Systems  Constitutional:  Positive for unexpected weight change (wt gain). Negative for appetite change, chills, diaphoresis and fatigue.  HENT: Negative.     Eyes: Negative.   Respiratory:  Negative for cough, chest tightness, shortness of breath and wheezing.   Cardiovascular:  Negative for chest pain, palpitations and leg swelling.  Gastrointestinal:  Negative for abdominal pain, constipation, diarrhea, nausea and vomiting.  Endocrine: Negative.   Genitourinary: Negative.  Negative for difficulty urinating, scrotal swelling and testicular pain.  Musculoskeletal: Negative.  Negative for arthralgias, gait problem and myalgias.  Skin: Negative.   Neurological:  Negative for dizziness, weakness, light-headedness and headaches.  Hematological:  Negative for adenopathy. Does not bruise/bleed easily.    Objective:  BP (!) 146/100 (BP Location: Left Arm, Patient Position: Sitting, Cuff Size: Normal)   Pulse 95   Temp 98.5 F (36.9 C) (Oral)   Resp 16   Ht 6' (1.829 m)   Wt 212 lb (96.2 kg)   SpO2 98%   BMI 28.75 kg/m   BP Readings from Last 3 Encounters:  10/09/24 (!) 146/100  12/18/23 (!) 155/87  12/24/22 (!) 168/91    Wt Readings from Last 3 Encounters:  10/09/24 212 lb (96.2 kg)  12/18/23 152 lb 1.9 oz (69 kg)  12/24/22 213 lb 10 oz (96.9 kg)    Physical Exam Vitals reviewed.  Constitutional:      General: He is not in acute distress.    Appearance: He is ill-appearing. He is not toxic-appearing or diaphoretic.  HENT:     Nose: Nose normal.     Mouth/Throat:     Mouth: Mucous membranes are moist.  Eyes:     General: No scleral icterus.    Conjunctiva/sclera: Conjunctivae normal.  Cardiovascular:     Rate and Rhythm: Regular rhythm. Bradycardia present.     Heart sounds: Normal heart sounds, S1 normal and S2 normal. No murmur heard.    No friction rub. No gallop.     Comments: EKG--- SB (new), 58 bpm Septal infarct pattern is not new No LVH Pulmonary:     Effort: Pulmonary effort is normal.     Breath sounds: No stridor. No wheezing, rhonchi or rales.  Abdominal:     General: Abdomen is flat.     Palpations:  There is no mass.     Tenderness: There is no abdominal tenderness. There is no guarding.     Hernia: No hernia is present.  Musculoskeletal:        General: Normal range of motion.     Cervical back: Neck supple.     Right lower leg: No edema.     Left lower leg: No edema.  Lymphadenopathy:     Cervical: No cervical adenopathy.  Skin:    General: Skin is warm and dry.  Neurological:     General: No focal deficit present.     Mental Status: He is alert. Mental status is at baseline.  Psychiatric:        Mood and Affect: Mood normal.  Behavior: Behavior normal.     Lab Results  Component Value Date   WBC 3.9 (L) 12/24/2022   HGB 13.0 12/24/2022   HCT 40.0 12/24/2022   PLT 209 12/24/2022   GLUCOSE 103 (H) 12/24/2022   CHOL 221 (H) 02/13/2022   TRIG 91 02/13/2022   HDL 52 02/13/2022   LDLCALC 153 (H) 02/13/2022   ALT 17 01/02/2022   AST 28 01/02/2022   NA 132 (L) 12/24/2022   K 4.1 12/24/2022   CL 104 12/24/2022   CREATININE 0.96 12/24/2022   BUN 19 12/24/2022   CO2 21 (L) 12/24/2022   TSH 0.88 01/02/2022   PSA 0.29 01/19/2022   INR 1.1 04/04/2021   HGBA1C 6.5 01/02/2022    DG Chest 2 View Result Date: 12/18/2023 CLINICAL DATA:  63 year old male with history of cough and sore throat for 1 week. EXAM: CHEST - 2 VIEW COMPARISON:  Chest x-ray 12/24/2022. FINDINGS: Lung volumes are normal. No consolidative airspace disease. No pleural effusions. No pneumothorax. No pulmonary nodule or mass noted. Pulmonary vasculature and the cardiomediastinal silhouette are within normal limits. Status post median sternotomy for CABG. IMPRESSION: 1.  No radiographic evidence of acute cardiopulmonary disease. Electronically Signed   By: Toribio Aye M.D.   On: 12/18/2023 10:16    Assessment & Plan:  Type 2 diabetes mellitus with complication, without long-term current use of insulin  (HCC) -     Basic metabolic panel with GFR; Future -     Microalbumin / creatinine urine ratio;  Future -     Urinalysis, Routine w reflex microscopic; Future -     Hemoglobin A1c; Future -     HM Diabetes Foot Exam  Ischemic cardiomyopathy -     Ambulatory referral to Cardiology  Dietary folate deficiency anemia -     CBC with Differential/Platelet; Future -     Vitamin B12; Future -     Methylmalonic acid, serum; Future  Thiamine  deficiency -     CBC with Differential/Platelet; Future  Routine general medical examination at a health care facility -     PSA; Future  Hyperlipidemia LDL goal <70 -     Lipid panel; Future -     TSH; Future -     Hepatic function panel; Future  Gastroesophageal reflux disease, unspecified whether esophagitis present -     CBC with Differential/Platelet; Future  Atherosclerosis of native coronary artery of native heart without angina pectoris  Essential hypertension -     EKG 12-Lead -     Aldosterone + renin activity w/ ratio; Future  Intrinsic eczema -     Triamcinolone  Acetonide; Apply topically 3 (three) times daily.  Dispense: 80 g; Refill: 1     Follow-up: Return in about 3 months (around 01/09/2025).  Debby Molt, MD

## 2024-10-10 ENCOUNTER — Telehealth: Payer: Self-pay

## 2024-10-10 ENCOUNTER — Ambulatory Visit: Payer: Self-pay | Admitting: Internal Medicine

## 2024-10-10 ENCOUNTER — Other Ambulatory Visit: Payer: Self-pay

## 2024-10-10 DIAGNOSIS — E785 Hyperlipidemia, unspecified: Secondary | ICD-10-CM

## 2024-10-10 MED ORDER — TORSEMIDE 20 MG PO TABS: 20.0000 mg | ORAL_TABLET | Freq: Every day | ORAL | 0 refills | Status: DC

## 2024-10-10 MED ORDER — ROSUVASTATIN CALCIUM 40 MG PO TABS: 40.0000 mg | ORAL_TABLET | Freq: Every day | ORAL | 0 refills | Status: DC

## 2024-10-10 MED ORDER — ASPIRIN CHILDRENS 81 MG PO CHEW: 81.0000 mg | CHEWABLE_TABLET | Freq: Every day | ORAL | 1 refills | Status: AC

## 2024-10-10 MED ORDER — AMLODIPINE BESYLATE 5 MG PO TABS: 5.0000 mg | ORAL_TABLET | Freq: Every day | ORAL | 0 refills | Status: DC

## 2024-10-10 NOTE — Telephone Encounter (Signed)
 Copied from CRM #[id number]. Topic: Clinical - Medication Question >> Oct 10, 2024 11:07 AM Viola F wrote: Patient seen Dr. Joshua yesterday and needs the carvedilol  (COREG ) 3.125 MG tablet and the Ezetimibe  (ZETIA ) 10 MG tablet sent to the pharmacy asap, [name].

## 2024-10-10 NOTE — Telephone Encounter (Signed)
 Medication refill request has been sent to his cardiologist. Patient has been made aware that his heart doctor is who follow these meds and to reach out to their office. He gave a verbal understanding.

## 2024-10-12 ENCOUNTER — Telehealth: Payer: Self-pay | Admitting: *Deleted

## 2024-10-12 LAB — METHYLMALONIC ACID, SERUM: Methylmalonic Acid, Quant: 107 nmol/L (ref 69–390)

## 2024-10-12 NOTE — Progress Notes (Signed)
 Complex Care Management Note  Care Guide Note 10/12/2024 Name: Bradley Watson MRN: [id number] DOB: [birth year]  Bradley Watson is a 63 y.o. year old male who sees Joshua Debby CROME, MD for primary care. I reached out to Bradley Watson by phone today to offer complex care management services.  [name] was given information about Complex Care Management services today including:   The Complex Care Management services include support from the care team which includes your Nurse Care Manager, Clinical Social Worker, or Pharmacist.  The Complex Care Management team is here to help remove barriers to the health concerns and goals most important to you. Complex Care Management services are voluntary, and the patient may decline or stop services at any time by request to their care team member.   Complex Care Management Consent Status: Patient agreed to services and verbal consent obtained.   Follow up plan:  Telephone appointment with complex care management team member scheduled for:  10/21/2024 and 11/21/2024  Encounter Outcome:  Patient Scheduled  [name], CMA [place]  [facility] Guide Direct Dial: [phone]  Fax: [phone] Website: [email or web address]

## 2024-10-16 MED ORDER — CARVEDILOL 3.125 MG PO TABS: 3.1250 mg | ORAL_TABLET | Freq: Two times a day (BID) | ORAL | 0 refills | Status: DC

## 2024-10-16 MED ORDER — EZETIMIBE 10 MG PO TABS: 10.0000 mg | ORAL_TABLET | Freq: Every day | ORAL | 0 refills | Status: DC

## 2024-10-16 NOTE — Telephone Encounter (Signed)
 Unable to leave voice message. Mailbox was full. Prescriptions refilled for 3 months. Pt will need appointment for any further refills.

## 2024-10-18 ENCOUNTER — Other Ambulatory Visit: Payer: Self-pay | Admitting: Cardiology

## 2024-10-19 ENCOUNTER — Telehealth: Payer: Self-pay | Admitting: Cardiology

## 2024-10-19 ENCOUNTER — Ambulatory Visit: Payer: Self-pay | Admitting: *Deleted

## 2024-10-19 NOTE — Telephone Encounter (Signed)
 FYI

## 2024-10-19 NOTE — Telephone Encounter (Signed)
 Spoke with pt, he reports he had stopped all of his medications and started having problems with chest pain and SOB. He was seen by dr joshua last week and his medications were restarted. He is calling and asking if now that he has been back on his medications, will his chest pain and SOB go away. He reports chest heaviness and loss of breath when walking his dog this morning. When he rest the discomfort will go away. He reports dr joshua called and discussed his condition with dr hochrein and was given an appointment. He has not been seen since 2023. Advised patient I do not know if his pain will go away as I do not know the cause of his pain. I offered a sooner appointment with APP but he declined. ER precautions discussed with the patient. He will call back with concerns.

## 2024-10-19 NOTE — Telephone Encounter (Signed)
 Pt c/o of Chest Pain: STAT if active (IN THIS MOMENT) CP, including tightness, pressure, jaw pain, shoulder/upper arm/back pain, SOB, nausea, and vomiting.  1. Are you having CP right now (tightness, pressure, or discomfort)?  No   2. Are you experiencing any other symptoms (ex. SOB, nausea, vomiting, sweating)?  No   3. How long have you been experiencing CP?  Occurred about 30 minutes ago while walking dog   4. Is your CP continuous or coming and going?  Coming and going   5. Have you taken Nitroglycerin ?  Patient says he doesn't have any

## 2024-10-19 NOTE — Telephone Encounter (Signed)
 FYI Only or Action Required?: FYI only for provider: ED advised.  Patient was last seen in primary care on 10/09/2024 by Joshua Debby CROME, MD.  Called Nurse Triage reporting Shortness of Breath (Chest pain, fatigue).  Symptoms began today.  Interventions attempted: Rest, hydration, or home remedies.  Symptoms are: gradually improving.  Triage Disposition: Go to ED Now (Notify PCP)  Patient/caregiver understands and will follow disposition?: Unsure  Copied from CRM [id number]. Topic: Clinical - Red Word Triage >> Oct 19, 2024  8:01 AM Victoria A wrote: [facility] that prompted transfer to Nurse Triage: Patient is experiencing chest pain, fatigue and shortness of breath from walking his dog this morning. Reason for Disposition  Difficulty breathing  Answer Assessment - Initial Assessment Questions 1. LOCATION: Where does it hurt?       Discomfort- center of chest 2. RADIATION: Does the pain go anywhere else? (e.g., into neck, jaw, arms, back)     no 3. ONSET: When did the chest pain begin? (Minutes, hours or days)      Ongoing- today worse 4. PATTERN: Does the pain come and go, or has it been constant since it started?  Does it get worse with exertion?      Worse with exertion, comes and goes 5. DURATION: How long does it last (e.g., seconds, minutes, hours)     30 minutes 6. SEVERITY: How bad is the pain?  (e.g., Scale 1-10; mild, moderate, or severe)     1-2/10 7. CARDIAC RISK FACTORS: Do you have any history of heart problems or risk factors for heart disease? (e.g., angina, prior heart attack; diabetes, high blood pressure, high cholesterol, smoker, or strong family history of heart disease)     Chest pain 8. PULMONARY RISK FACTORS: Do you have any history of lung disease?  (e.g., blood clots in lung, asthma, emphysema, birth control pills)     asthma 9. CAUSE: What do you think is causing the chest pain?     Unsure- patient went to walk the dog and had  pain 10. OTHER SYMPTOMS: Do you have any other symptoms? (e.g., dizziness, nausea, vomiting, sweating, fever, difficulty breathing, cough)       Fatigue, SOB  Protocols used: Chest Pain-A-AH

## 2024-10-19 NOTE — Telephone Encounter (Signed)
 Patient states that he called and spoke with his cardiologist office about this and they advised him that they would try to get him in earlier. He advised me that these are only episodes they aren't consistent BUT he had heart surgery and he is concerned. I offered him an appointment and he declined it states that he will take his medication and then got to the ED in the morning. I advised him that if he continues having these episodes that he should be seen in the ED today. I also advised him to call us  back today if anything changes or gets worst. He gave a verbal understanding.

## 2024-10-20 ENCOUNTER — Encounter (HOSPITAL_BASED_OUTPATIENT_CLINIC_OR_DEPARTMENT_OTHER): Payer: Self-pay | Admitting: Emergency Medicine

## 2024-10-20 ENCOUNTER — Emergency Department (HOSPITAL_BASED_OUTPATIENT_CLINIC_OR_DEPARTMENT_OTHER)

## 2024-10-20 ENCOUNTER — Other Ambulatory Visit: Payer: Self-pay

## 2024-10-20 ENCOUNTER — Emergency Department (HOSPITAL_BASED_OUTPATIENT_CLINIC_OR_DEPARTMENT_OTHER)
Admission: EM | Admit: 2024-10-20 | Discharge: 2024-10-20 | Disposition: A | Discharge status: Home / Self Care | Attending: Emergency Medicine | Admitting: Emergency Medicine

## 2024-10-20 DIAGNOSIS — Z79899 Other long term (current) drug therapy: Secondary | ICD-10-CM | POA: Insufficient documentation

## 2024-10-20 DIAGNOSIS — I2510 Atherosclerotic heart disease of native coronary artery without angina pectoris: Secondary | ICD-10-CM | POA: Insufficient documentation

## 2024-10-20 DIAGNOSIS — R030 Elevated blood-pressure reading, without diagnosis of hypertension: Secondary | ICD-10-CM | POA: Insufficient documentation

## 2024-10-20 DIAGNOSIS — R0600 Dyspnea, unspecified: Secondary | ICD-10-CM | POA: Insufficient documentation

## 2024-10-20 DIAGNOSIS — N289 Disorder of kidney and ureter, unspecified: Secondary | ICD-10-CM | POA: Diagnosis not present

## 2024-10-20 DIAGNOSIS — Z7982 Long term (current) use of aspirin: Secondary | ICD-10-CM | POA: Diagnosis not present

## 2024-10-20 DIAGNOSIS — R0602 Shortness of breath: Secondary | ICD-10-CM | POA: Diagnosis present

## 2024-10-20 LAB — TROPONIN T, HIGH SENSITIVITY
Troponin T High Sensitivity: 27 ng/L — ABNORMAL HIGH (ref 0–19)
Troponin T High Sensitivity: 21 ng/L — ABNORMAL HIGH (ref 0–19)

## 2024-10-20 LAB — BASIC METABOLIC PANEL WITH GFR
Anion gap: 13 (ref 5–15)
BUN: 33 mg/dL — ABNORMAL HIGH (ref 8–23)
CO2: 27 mmol/L (ref 22–32)
Calcium: 9.7 mg/dL (ref 8.9–10.3)
Chloride: 98 mmol/L (ref 98–111)
Creatinine, Ser: 1.45 mg/dL — ABNORMAL HIGH (ref 0.61–1.24)
GFR, Estimated: 53 mL/min — ABNORMAL LOW (ref >60)
Glucose, Bld: 101 mg/dL — ABNORMAL HIGH (ref 70–99)
Potassium: 3.9 mmol/L (ref 3.5–5.1)
Sodium: 138 mmol/L (ref 135–145)

## 2024-10-20 LAB — CBC
HCT: 43.6 % (ref 39.0–52.0)
Hemoglobin: 14.2 g/dL (ref 13.0–17.0)
MCH: 28.9 pg (ref 26.0–34.0)
MCHC: 32.6 g/dL (ref 30.0–36.0)
MCV: 88.8 fL (ref 80.0–100.0)
Platelets: 304 K/uL (ref 150–400)
RBC: 4.91 MIL/uL (ref 4.22–5.81)
RDW: 14.5 % (ref 11.5–15.5)
WBC: 6.2 K/uL (ref 4.0–10.5)
nRBC: 0 % (ref 0.0–0.2)

## 2024-10-20 LAB — PRO BRAIN NATRIURETIC PEPTIDE: Pro Brain Natriuretic Peptide: 55.6 pg/mL (ref <300.0)

## 2024-10-20 LAB — MAGNESIUM: Magnesium: 2.2 mg/dL (ref 1.7–2.4)

## 2024-10-20 MED ORDER — SODIUM CHLORIDE 0.9 % IV BOLUS
500.0000 mL | Freq: Once | INTRAVENOUS | Status: AC
  Administered 2024-10-20: 500 mL via INTRAVENOUS

## 2024-10-20 NOTE — ED Triage Notes (Signed)
 Shortness of breath x 1 week , no cough or chest pain . HX CAD . No swelling to extremities .

## 2024-10-20 NOTE — Discharge Instructions (Addendum)
 Follow-up closely with cardiology. Have your kidney function rechecked in a few weeks. Continue take your blood pressure as previously directed. Return to emergency room for persistent chest pain, passing out or new concerns.

## 2024-10-20 NOTE — ED Provider Notes (Signed)
 [name] Provider Note   CSN: [id number] Arrival date & time: 10/20/24  9188     Patient presents with: Shortness of Breath   Bradley Watson is a 63 y.o. male.   Patient with history of CAD presents with 1 week of feeling more tired and shortness of breath.  Patient's had very brief episodes of chest discomfort nonradiating.  No recent stress test.  Patient has elevated blood pressure and did miss multiple doses of blood pressure meds but recently started taking them regularly.  Currently no chest pain or shortness of breath.  The history is provided by the patient.  Shortness of Breath Associated symptoms: chest pain   Associated symptoms: no abdominal pain, no fever, no headaches, no neck pain, no rash and no vomiting        Prior to Admission medications   Medication Sig Start Date End Date Taking? Authorizing Provider  amLODipine (NORVASC) 5 MG tablet Take 1 tablet (5 mg total) by mouth daily. 10/10/24   [name], MD  ASPIRIN  CHILDRENS 81 MG chewable tablet Chew 1 tablet (81 mg total) by mouth daily. 10/10/24   [name], MD  carvedilol  (COREG ) 3.125 MG tablet Take 1 tablet (3.125 mg total) by mouth 2 (two) times daily. Please call for and appointment with cardiologist to receive a complete prescription . 10/16/24   [name], MD  dorzolamide (TRUSOPT) 2 % ophthalmic solution Place 1 drop into both eyes daily.    [provider]  ezetimibe  (ZETIA ) 10 MG tablet Take 1 tablet (10 mg total) by mouth daily. 10/16/24   [name], MD  losartan  (COZAAR ) 25 MG tablet Take 1 tablet (25 mg total) by mouth daily. Patient not taking: Reported on 10/09/2024 02/28/21 01/01/22  [name], MD  omeprazole  (PRILOSEC) 20 MG capsule Take 2 tabs daily 30 minutes before a meal for 2 weeks and then 1 tab daily 30 minutes before a meal Patient not taking: Reported on 10/09/2024 10/08/20   [name], MD   rosuvastatin  (CRESTOR ) 40 MG tablet Take 1 tablet (40 mg total) by mouth daily. 10/10/24   [name], MD  torsemide (DEMADEX) 20 MG tablet Take 1 tablet (20 mg total) by mouth daily. 10/10/24   [name], MD  TRELEGY ELLIPTA  100-62.5-25 MCG/ACT AEPB Inhale 1 puff into the lungs daily. Patient not taking: Reported on 10/09/2024 12/16/21   [name], MD  triamcinolone  ointment (KENALOG ) 0.1 % Apply topically 3 (three) times daily. 10/09/24   [name], MD    Allergies: Penicillins and Lisinopril     Review of Systems  Constitutional:  Negative for chills and fever.  HENT:  Negative for congestion.   Eyes:  Negative for visual disturbance.  Respiratory:  Positive for shortness of breath.   Cardiovascular:  Positive for chest pain.  Gastrointestinal:  Negative for abdominal pain and vomiting.  Genitourinary:  Negative for dysuria and flank pain.  Musculoskeletal:  Negative for back pain, neck pain and neck stiffness.  Skin:  Negative for rash.  Neurological:  Negative for light-headedness and headaches.    Updated Vital Signs BP (!) 147/104   Pulse 71   Temp 97.6 F (36.4 C)   Resp 14   Wt 96 kg   SpO2 98%   BMI 28.70 kg/m   Physical Exam Vitals and nursing note reviewed.  Constitutional:      General: He is not in acute distress.  Appearance: He is well-developed.  HENT:     Head: Normocephalic and atraumatic.     Mouth/Throat:     Mouth: Mucous membranes are moist.  Eyes:     General:        Right eye: No discharge.        Left eye: No discharge.     Conjunctiva/sclera: Conjunctivae normal.  Neck:     Trachea: No tracheal deviation.  Cardiovascular:     Rate and Rhythm: Normal rate and regular rhythm.     Heart sounds: No murmur heard. Pulmonary:     Effort: Pulmonary effort is normal.     Breath sounds: Normal breath sounds.  Abdominal:     General: There is no distension.     Palpations: Abdomen is soft.     Tenderness: There is no  abdominal tenderness. There is no guarding.  Musculoskeletal:        General: Normal range of motion.     Cervical back: Normal range of motion and neck supple. No rigidity.     Right lower leg: No edema.     Left lower leg: No edema.  Skin:    General: Skin is warm.     Capillary Refill: Capillary refill takes less than 2 seconds.     Findings: No ecchymosis or rash.  Neurological:     General: No focal deficit present.     Mental Status: He is alert.     Cranial Nerves: No cranial nerve deficit.  Psychiatric:        Mood and Affect: Mood normal.     (all labs ordered are listed, but only abnormal results are displayed) Labs Reviewed  BASIC METABOLIC PANEL WITH GFR - Abnormal; Notable for the following components:      Result Value   Glucose, Bld 101 (*)    BUN 33 (*)    Creatinine, Ser 1.45 (*)    GFR, Estimated 53 (*)    All other components within normal limits  TROPONIN T, HIGH SENSITIVITY - Abnormal; Notable for the following components:   Troponin T High Sensitivity 27 (*)    All other components within normal limits  TROPONIN T, HIGH SENSITIVITY - Abnormal; Notable for the following components:   Troponin T High Sensitivity 21 (*)    All other components within normal limits  CBC  PRO BRAIN NATRIURETIC PEPTIDE  MAGNESIUM     EKG: EKG Interpretation Date/Time:  Friday October 20 2024 08:24:22 EDT Ventricular Rate:  70 PR Interval:  177 QRS Duration:  107 QT Interval:  400 QTC Calculation: 432 R Axis:   35  Text Interpretation: Sinus rhythm Confirmed by Tonia Chew [phone]) on 10/20/2024 8:29:23 AM  Radiology: DG Chest 2 View Result Date: 10/20/2024 CLINICAL DATA:  shortness of breath EXAM: DG CHEST 2V COMPARISON:  12/18/2023 FINDINGS: No focal airspace consolidation, pleural effusion, or pneumothorax. No cardiomegaly. Sternotomy wires and CABG markers. No acute fracture or destructive lesions. Multilevel thoracic osteophytosis. IMPRESSION: No acute  cardiopulmonary abnormality. Electronically Signed   By: Rogelia Myers M.D.   On: 10/20/2024 09:40     Procedures   Medications Ordered in the ED  sodium chloride  0.9 % bolus 500 mL (0 mLs Intravenous Stopped 10/20/24 1114)                                    Medical Decision Making Amount and/or Complexity of Data Reviewed  Labs: ordered. Radiology: ordered.   Patient with known high blood pressure and CAD presents with primarily shortness of breath and fatigue and elevated blood pressure.  Patient has been more compliant with his blood pressure meds recently.  Patient understands importance of taking it regularly and follow-up outpatient for this.  Screening blood work reviewed independently showing mild renal insufficiency patient understands this may be due to uncontrolled blood pressure recently and to follow-up for repeat kidney function in 2 weeks.  IV fluid bolus given in the ER.  Troponin minimally elevated, repeat troponin lower 21.  Patient has no active chest pain.  Discussed importance of urgent follow-up with cardiology to discuss next [place] management.  Chest x-ray independent reviewed no acute abnormalities.  EKG no acute ST elevation.  Patient stable for discharge.     Final diagnoses:  Acute renal insufficiency  Acute dyspnea  Elevated blood pressure reading    ED Discharge Orders          Ordered    Ambulatory referral to Cardiology       Comments: If you have not heard from the Cardiology office within the next 72 hours please call [phone].   10/20/24 9052               Tonia Chew, MD 10/20/24 1318

## 2024-10-23 ENCOUNTER — Encounter: Payer: Self-pay | Admitting: Internal Medicine

## 2024-10-23 ENCOUNTER — Other Ambulatory Visit: Admitting: Pharmacist

## 2024-10-23 DIAGNOSIS — I10 Essential (primary) hypertension: Secondary | ICD-10-CM

## 2024-10-23 DIAGNOSIS — E785 Hyperlipidemia, unspecified: Secondary | ICD-10-CM
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
## 2024-10-24 ENCOUNTER — Other Ambulatory Visit (INDEPENDENT_AMBULATORY_CARE_PROVIDER_SITE_OTHER)

## 2024-10-24 DIAGNOSIS — I10 Essential (primary) hypertension: Secondary | ICD-10-CM | POA: Diagnosis not present

## 2024-10-24 LAB — BASIC METABOLIC PANEL WITH GFR
BUN: 25 mg/dL — ABNORMAL HIGH (ref 6–23)
CO2: 26 meq/L (ref 19–32)
Calcium: 9.2 mg/dL (ref 8.4–10.5)
Chloride: 102 meq/L (ref 96–112)
Creatinine, Ser: 1.17 mg/dL (ref 0.40–1.50)
GFR: 65.31 mL/min (ref >60.00)
Glucose, Bld: 91 mg/dL (ref 70–99)
Potassium: 4.1 meq/L (ref 3.5–5.1)
Sodium: 136 meq/L (ref 135–145)

## 2024-10-27 ENCOUNTER — Ambulatory Visit: Payer: Self-pay | Admitting: Pharmacist

## 2024-10-27 NOTE — Telephone Encounter (Signed)
 Contacted patient to discuss lab results and check on symptoms and blood pressure. Kidney function improved back to WNL on BMP. Pt notes he still is having fatigue, mainly if he is moving around for longer than 30 minutes to 1 hour. He has not checked BP at home yet, plans to get a BP monitor to check over the weekend. Will plan to call back early next week.  Darrelyn Drum, PharmD, BCPS, CPP Clinical Pharmacist Practitioner [facility] [phone]

## 2024-11-03 NOTE — Progress Notes (Signed)
 Cardiology Office Note   Date:  11/06/2024  ID:  Bradley Watson, DOB 08/05/1961, MRN [id number] PCP: Joshua Debby CROME, MD  [facility] Providers Cardiologist:  Lynwood Schilling, MD   History of Present Illness Bradley Watson is a 63 y.o. male with a past medical history of coronary bypass grafting 10/2012 with LIMA to LAD, left radial to the obtuse marginal.  Echocardiogram November 2013 showed normal LV function, grade 1 DD and mild left atrial enlargement.  Ultimately, here for follow-up appointment.  Nuclear study June 2015 showed an ejection fraction of 52%.  Apical thinning but no ischemia.  Had repeat Lexiscan  ordered by Dr. Joshua, PCP demonstrated a small apical reversible defect with an EF slightly lower than previous.  Echocardiogram dated 02/02/2020 revealed EF of 45 to 50%, left ventricle did have some mildly decreased function.  Found to have grade 1 DD.  RV systolic function was moderately reduced.  Was in the ED January 2023 with what he had thought were flulike symptoms and palpitations.  Tested positive for COVID.  Was given a dose of steroids and bronchodilators instead on his way.  He was seen at that time had some palpitations.  When he was seen by Dr. Schilling in February 2023 he was feeling poorly.  Had been off work for a week because he was so fatigued.  Had not slept all night.  Was coughing and had nasal congestion.  He had mucus in his chest.  He felt like he could not breathe out of his nose.  Was not really describing any PND orthopnea.  Not having any chest pressure, neck or arm discomfort.  Had not had any weight gain or edema.  Reviewed echocardiogram at that time with the patient and his heart pump function had, up to 55 to 60%.  Chest x-ray done earlier in the year showing no acute disease.  Today, he presents today for cardiovascular follow-up appointment.  2-1/2 weeks ago he experienced chest pain and elevated blood pressure due to inconsistent medication  intake.  He was only taking his medications once a week to prolong his prescription.  He tells me that his numbers were all off due to this.  He is now working on improving his endurance and reports no recent chest pain or pressure but does still have some shortness of breath.  Experiences palpitations and feels like his heart is racing at night sometimes this changes with position.  Has been taking his Crestor  regularly for the past 2 weeks and is on amlodipine, carvedilol , and ezetimibe  for blood pressure and cholesterol management.  Was not taking his losartan  because he was told by another provider that he did not need it.  He does experience frequent urination and increased his water intake especially at night.  He is dealing with a lot socially right now in regards to his wife having dementia and him being the sole caregiver.  For example, today he drove here without glasses because his wife misplaced them yesterday.  He is feeling overwhelmed trying to do all of the home tasks, work full-time, and provide care for her.  Reports no shortness of breath nor dyspnea on exertion. Reports no chest pain, pressure, or tightness. No edema, orthopnea, PND. Reports no palpitations.    Discussed the use of AI scribe software for clinical note transcription with the patient, who gave verbal consent to proceed.  ROS: Pertinent ROS in HPI  Studies Reviewed     Echo 02/02/2022 IMPRESSIONS  1. Left ventricular ejection fraction, by estimation, is 55 to 60%. The  left ventricle has normal function. The left ventricle demonstrates  regional wall motion abnormalities (see scoring diagram/findings for  description). Left ventricular diastolic  parameters are consistent with Grade I diastolic dysfunction (impaired  relaxation).   2. Right ventricular systolic function is normal. The right ventricular  size is normal.   3. The mitral valve is normal in structure. Mild mitral valve  regurgitation. No  evidence of mitral stenosis.   4. The aortic valve is calcified. There is moderate calcification of the  aortic valve. Aortic valve regurgitation is not visualized. Aortic valve  sclerosis is present, with no evidence of aortic valve stenosis.   5. The inferior vena cava is normal in size with greater than 50%  respiratory variability, suggesting right atrial pressure of 3 mmHg.       Physical Exam VS:  BP 132/82 (BP Location: Left Arm, Patient Position: Sitting, Cuff Size: Large)   Pulse 99   Ht 6' (1.829 m)   Wt 208 lb 9.6 oz (94.6 kg)   SpO2 97%   BMI 28.29 kg/m        Wt Readings from Last 3 Encounters:  11/06/24 208 lb 9.6 oz (94.6 kg)  10/20/24 211 lb 10.3 oz (96 kg)  10/09/24 212 lb (96.2 kg)    GEN: Well nourished, well developed in no acute distress NECK: No JVD; No carotid bruits CARDIAC: RRR, no murmurs, rubs, gallops RESPIRATORY:  Clear to auscultation without rales, wheezing or rhonchi  ABDOMEN: Soft, non-tender, non-distended EXTREMITIES:  No edema; No deformity   ASSESSMENT AND PLAN  CAD with ischemic cardiomyopathy -Recent chest pain and elevated levels due to nonadherence to medications.  Blood pressure improved medication adherence.  No recent echocardiogram to assess heart pump function -Order echocardiogram to assess heart pump function continue amlodipine and carvedilol  -Hold losartan  for now unless blood pressure increases  Hyperlipidemia with markedly elevated LDL -LDL 220, significant above target of less than 70.  Recently resume rosuvastatin  and ezetimibe .   - Plan to reassess lipid panel in 6 weeks - Continue rosuvastatin  and ezetimibe  daily  Essential hypertension -Blood pressure improved with medication adherence.  Currently on amlodipine and carvedilol .  Losartan  held due to hypotension -Continue amlodipine and carvedilol   Palpitations -Intermittent palpitations more notable at night.  Likely related position changes.  No immediate concern  was frequency increases and duration increases. - We will consider a heart monitor if palpitations become more frequent  Tinnitus, left ear secondary to potential diuretic therapy -Reduce Demadex down to 10 mg daily and consider alternative diuretic if symptoms persist  Polyuria and increased thirst due to diuretic therapy -Decrease Demadex to 10 mg daily and monitor for lower extremity swelling -Limit water intake to 64 ounces daily    Dispo: He can follow-up with me in 6 weeks or MD  Signed, Orren LOISE Fabry, PA-C

## 2024-11-06 ENCOUNTER — Encounter: Payer: Self-pay | Admitting: Physician Assistant

## 2024-11-06 ENCOUNTER — Ambulatory Visit: Attending: Physician Assistant | Admitting: Physician Assistant

## 2024-11-06 ENCOUNTER — Other Ambulatory Visit: Payer: Self-pay

## 2024-11-06 ENCOUNTER — Other Ambulatory Visit: Payer: Self-pay | Admitting: Physician Assistant

## 2024-11-06 VITALS — BP 132/82 | HR 99 | Ht 72.0 in | Wt 208.6 lb

## 2024-11-06 DIAGNOSIS — E785 Hyperlipidemia, unspecified: Secondary | ICD-10-CM

## 2024-11-06 DIAGNOSIS — R002 Palpitations: Secondary | ICD-10-CM | POA: Diagnosis not present

## 2024-11-06 DIAGNOSIS — I10 Essential (primary) hypertension: Secondary | ICD-10-CM

## 2024-11-06 DIAGNOSIS — I6523 Occlusion and stenosis of bilateral carotid arteries: Secondary | ICD-10-CM

## 2024-11-06 DIAGNOSIS — I2510 Atherosclerotic heart disease of native coronary artery without angina pectoris: Secondary | ICD-10-CM | POA: Diagnosis not present

## 2024-11-06 DIAGNOSIS — R5383 Other fatigue: Secondary | ICD-10-CM

## 2024-11-06 DIAGNOSIS — I255 Ischemic cardiomyopathy: Secondary | ICD-10-CM | POA: Diagnosis not present

## 2024-11-06 MED ORDER — TORSEMIDE 10 MG PO TABS: 10.0000 mg | ORAL_TABLET | Freq: Every day | ORAL | 1 refills | Status: DC

## 2024-11-06 NOTE — Patient Instructions (Signed)
 Medication Instructions:  DECREASE Torsemide (demadex) to 10mg  Take 1 tablet once a day  *If you need a refill on your cardiac medications before your next appointment, please call your pharmacy*  Lab Work: 6 WEEKS FASTING LIPIDS & BMET in 6 weeks (12/18/2024) If you have labs (blood work) drawn today and your tests are completely normal, you will receive your results only by: MyChart Message (if you have MyChart) OR A paper copy in the mail If you have any lab test that is abnormal or we need to change your treatment, we will call you to review the results.  Testing/Procedures: Your physician has requested that you have an echocardiogram. Echocardiography is a painless test that uses sound waves to create images of your heart. It provides your doctor with information about the size and shape of your heart and how well your heart's chambers and valves are working. This procedure takes approximately one hour. There are no restrictions for this procedure. Please do NOT wear cologne, perfume, aftershave, or lotions (deodorant is allowed). Please arrive 15 minutes prior to your appointment time.  Please note: We ask at that you not bring children with you during ultrasound (echo/ vascular) testing. Due to room size and safety concerns, children are not allowed in the ultrasound rooms during exams. Our front office staff cannot provide observation of children in our lobby area while testing is being conducted. An adult accompanying a patient to their appointment will only be allowed in the ultrasound room at the discretion of the ultrasound technician under special circumstances. We apologize for any inconvenience.  Follow-Up: At [facility], you and your health needs are our priority.  As part of our continuing mission to provide you with exceptional heart care, our providers are all part of one team.  This team includes your primary Cardiologist (physician) and Advanced Practice Providers  or APPs (Physician Assistants and Nurse Practitioners) who all work together to provide you with the care you need, when you need it.  Your next appointment:   6 week(s) (last week in December)  Provider:   Lynwood Schilling, MD or Orren Fabry, [place]   We recommend signing up for the patient portal called MyChart.  Sign up information is provided on this After Visit Summary.  MyChart is used to connect with patients for Virtual Visits (Telemedicine).  Patients are able to view lab/test results, encounter notes, upcoming appointments, etc.  Non-urgent messages can be sent to your provider as well.   To learn more about what you can do with MyChart, go to [email or web address].   Other Instructions

## 2024-11-09 LAB — ALDOSTERONE + RENIN ACTIVITY W/ RATIO
ALDO / PRA Ratio: 10.2 ratio (ref 0.9–28.9)
Aldosterone: 5 ng/dL
Renin Activity: 0.49 ng/mL/h (ref 0.25–5.82)

## 2024-11-20 ENCOUNTER — Encounter: Payer: Self-pay | Admitting: *Deleted

## 2024-11-20 ENCOUNTER — Telehealth: Payer: Self-pay | Admitting: *Deleted

## 2024-11-29 ENCOUNTER — Telehealth: Payer: Self-pay | Admitting: *Deleted

## 2024-11-29 LAB — LIPID PANEL
Chol/HDL Ratio: 2.7 ratio (ref 0.0–5.0)
Cholesterol, Total: 233 mg/dL — ABNORMAL HIGH (ref 100–199)
HDL: 87 mg/dL (ref >39)
LDL Chol Calc (NIH): 133 mg/dL — ABNORMAL HIGH (ref 0–99)
Triglycerides: 75 mg/dL (ref 0–149)
VLDL Cholesterol Cal: 13 mg/dL (ref 5–40)

## 2024-11-29 LAB — BASIC METABOLIC PANEL WITH GFR
BUN/Creatinine Ratio: 15 (ref 10–24)
BUN: 15 mg/dL (ref 8–27)
CO2: 21 mmol/L (ref 20–29)
Calcium: 8.6 mg/dL (ref 8.6–10.2)
Chloride: 102 mmol/L (ref 96–106)
Creatinine, Ser: 1 mg/dL (ref 0.76–1.27)
Glucose: 95 mg/dL (ref 70–99)
Potassium: 3.6 mmol/L (ref 3.5–5.2)
Sodium: 138 mmol/L (ref 134–144)
eGFR: 84 mL/min/1.73 (ref >59)

## 2024-11-29 NOTE — Progress Notes (Signed)
 Complex Care Management Care Guide Note  11/29/2024 Name: Bradley Watson MRN: [id number] DOB: [birth year]  Bradley Watson is a 63 y.o. year old male who is a primary care patient of Joshua Debby CROME, MD and is actively engaged with the care management team. I reached out to Bradley Watson by phone today to assist with re-scheduling  with the RN Case Manager.  Follow up plan: Unsuccessful telephone outreach attempt made. A HIPAA compliant phone message was left for the patient providing contact information and requesting a return call.  [name], CMA [place]  [facility], [facility] Guide Direct Dial: [phone]  Fax: [phone] Website: [email or web address]

## 2024-11-30 ENCOUNTER — Ambulatory Visit: Payer: Self-pay | Admitting: Physician Assistant

## 2024-11-30 DIAGNOSIS — E785 Hyperlipidemia, unspecified: Secondary | ICD-10-CM

## 2024-12-01 ENCOUNTER — Other Ambulatory Visit: Payer: Self-pay

## 2024-12-01 DIAGNOSIS — E785 Hyperlipidemia, unspecified: Secondary | ICD-10-CM

## 2024-12-05 ENCOUNTER — Other Ambulatory Visit (HOSPITAL_COMMUNITY): Payer: Self-pay

## 2024-12-05 ENCOUNTER — Telehealth: Payer: Self-pay | Admitting: Pharmacy Technician

## 2024-12-05 NOTE — Telephone Encounter (Signed)
 Pharmacy Patient Advocate Encounter   Received notification from Physician's Office that prior authorization for Repatha  is required/requested.   Insurance verification completed.   The patient is insured through [employer].   Per test claim: The current 12/05/24 day co-pay is, $24.99- one month.  No PA needed at this time. This test claim was processed through [facility]- copay amounts may vary at other pharmacies due to pharmacy/plan contracts, or as the patient moves through the different stages of their insurance plan.

## 2024-12-07 MED ORDER — REPATHA SURECLICK 140 MG/ML ~~LOC~~ SOAJ: 1.0000 mL | SUBCUTANEOUS | 11 refills | Status: AC

## 2024-12-07 NOTE — Addendum Note (Signed)
 Addended by: Analiza Cowger D on: 12/07/2024 12:14 PM   Modules accepted: Orders

## 2024-12-08 ENCOUNTER — Telehealth: Payer: Self-pay | Admitting: Cardiology

## 2024-12-08 NOTE — Telephone Encounter (Signed)
 Returned patient's call, 2 identifiers used. Patient reports that he is currently cleared to be off work til January 1. He has an echo scheduled for 12/18/24 and a follow up visit with T. Conte on 12/27/24. He is asking for a note that he can give to his workplace that will at least clear him to be off work til his echo is complete and he has seen Tessa on 12/27/24. Forwarded to T. Lucien.

## 2024-12-08 NOTE — Telephone Encounter (Signed)
 Pt is requesting a callback regarding him needing a letter on when he can return back to work. He stated he's been cleared to be out of work the remainder of the year but his next appt is not until 12/27/24 so he's unsure on when he can or should return because he won't know much until after his appt. He'd like to discuss further with nurse.

## 2024-12-11 NOTE — Telephone Encounter (Signed)
 Spoke with Bradley Watson who states we can write a work note keeping him out until 12/27/2024.   Patient voiced understanding.

## 2024-12-18 ENCOUNTER — Ambulatory Visit (HOSPITAL_COMMUNITY)
Admission: RE | Admit: 2024-12-18 | Discharge: 2024-12-18 | Disposition: A | Discharge status: Home / Self Care | Source: Ambulatory Visit | Attending: Physician Assistant | Admitting: Physician Assistant

## 2024-12-18 DIAGNOSIS — I2510 Atherosclerotic heart disease of native coronary artery without angina pectoris: Secondary | ICD-10-CM | POA: Insufficient documentation

## 2024-12-18 DIAGNOSIS — R002 Palpitations: Secondary | ICD-10-CM | POA: Diagnosis not present

## 2024-12-18 DIAGNOSIS — I10 Essential (primary) hypertension: Secondary | ICD-10-CM | POA: Insufficient documentation

## 2024-12-18 DIAGNOSIS — R5383 Other fatigue: Secondary | ICD-10-CM | POA: Diagnosis not present

## 2024-12-18 DIAGNOSIS — I6523 Occlusion and stenosis of bilateral carotid arteries: Secondary | ICD-10-CM | POA: Insufficient documentation

## 2024-12-18 DIAGNOSIS — E785 Hyperlipidemia, unspecified: Secondary | ICD-10-CM | POA: Insufficient documentation

## 2024-12-18 DIAGNOSIS — I255 Ischemic cardiomyopathy: Secondary | ICD-10-CM | POA: Diagnosis not present

## 2024-12-18 LAB — ECHOCARDIOGRAM COMPLETE
AR max vel: 2.6 cm2
AV Area VTI: 2.71 cm2
AV Area mean vel: 2.9 cm2
AV Mean grad: 5 mmHg
AV Peak grad: 9.1 mmHg
Ao pk vel: 1.51 m/s
Area-P 1/2: 2.77 cm2
Calc EF: 52.7 %
S' Lateral: 4.2 cm
Single Plane A2C EF: 51.7 %
Single Plane A4C EF: 52.6 %

## 2024-12-18 MED ORDER — PERFLUTREN LIPID MICROSPHERE
1.0000 mL | INTRAVENOUS | Status: AC | PRN
  Administered 2024-12-18: 2 mL via INTRAVENOUS

## 2024-12-19 ENCOUNTER — Telehealth: Payer: Self-pay | Admitting: Physician Assistant

## 2024-12-19 DIAGNOSIS — Z0279 Encounter for issue of other medical certificate: Secondary | ICD-10-CM

## 2024-12-19 NOTE — Telephone Encounter (Signed)
 Patient signed the Release of Information and paid $29 fee for us  to complete the [facility] Attending Physician's Statement form.  Form in Conte's box.

## 2024-12-22 NOTE — Progress Notes (Signed)
 Complex Care Management Care Guide Note  12/22/2024 Name: Bradley Watson MRN: [id number] DOB: February 27, 1961  Bradley Watson is a 64 y.o. year old male who is a primary care patient of Joshua Debby CROME, MD and is actively engaged with the care management team. I reached out to Bradley Watson by phone today to assist with re-scheduling  with the Licensed Clinical Child Psychotherapist.  Follow up plan: Unsuccessful telephone outreach attempt made. A HIPAA compliant phone message was left for the patient providing contact information and requesting a return call. No further outreach attempts will be made due to inability to maintain patient contact.   [name], CMA, [place]  [facility], [facility] Guide Direct Dial: [phone]  Fax: [phone] Website: [email or web address]

## 2024-12-26 NOTE — Progress Notes (Unsigned)
 " Cardiology Office Note   Date:  12/27/2024  ID:  Bradley Watson, DOB 31-Aug-1961, MRN [id number] PCP: Joshua Debby CROME, MD   HeartCare Providers Cardiologist:  Lynwood Schilling, MD   History of Present Illness Bradley Watson is a 64 y.o. male with a past medical history of coronary bypass grafting 10/2012 with LIMA to LAD, left radial to the obtuse marginal.  Echocardiogram November 2013 showed normal LV function, grade 1 DD and mild left atrial enlargement.  Ultimately, here for follow-up appointment.  Nuclear study June 2015 showed an ejection fraction of 52%.  Apical thinning but no ischemia.  Had repeat Lexiscan  ordered by Dr. Joshua, PCP demonstrated a small apical reversible defect with an EF slightly lower than previous.  Echocardiogram dated 02/02/2020 revealed EF of 45 to 50%, left ventricle did have some mildly decreased function.  Found to have grade 1 DD.  RV systolic function was moderately reduced.  Was in the ED January 2023 with what he had thought were flulike symptoms and palpitations.  Tested positive for COVID.  Was given a dose of steroids and bronchodilators instead on his way.  He was seen at that time had some palpitations.  When he was seen by Dr. Schilling in February 2023 he was feeling poorly.  Had been off work for a week because he was so fatigued.  Had not slept all night.  Was coughing and had nasal congestion.  He had mucus in his chest.  He felt like he could not breathe out of his nose.  Was not really describing any PND orthopnea.  Not having any chest pressure, neck or arm discomfort.  Had not had any weight gain or edema.  Reviewed echocardiogram at that time with the patient and his heart pump function had, up to 55 to 60%.  Chest x-ray done earlier in the year showing no acute disease.  I saw him 10/2024, he presents today for cardiovascular follow-up appointment.  2-1/2 weeks ago he experienced chest pain and elevated blood pressure due to inconsistent  medication intake.  He was only taking his medications once a week to prolong his prescription.  He tells me that his numbers were all off due to this.  He is now working on improving his endurance and reports no recent chest pain or pressure but does still have some shortness of breath.  Experiences palpitations and feels like his heart is racing at night sometimes this changes with position.  Has been taking his Crestor  regularly for the past 2 weeks and is on amlodipine , carvedilol , and ezetimibe  for blood pressure and cholesterol management.  Was not taking his losartan  because he was told by another provider that he did not need it.  He does experience frequent urination and increased his water intake especially at night.  He is dealing with a lot socially right now in regards to his wife having dementia and him being the sole caregiver.  For example, today he drove here without glasses because his wife misplaced them yesterday.  He is feeling overwhelmed trying to do all of the home tasks, work full-time, and provide care for her.  Reports no shortness of breath nor dyspnea on exertion. Reports no chest pain, pressure, or tightness. No edema, orthopnea, PND. Reports no palpitations.    Discussed the use of AI scribe software for clinical note transcription with the patient, who gave verbal consent to proceed.  Today, he presents with a hx of coronary artery disease  s/p  CABG in 2013  with shortness of breath and fatigue.  He reports worsening shortness of breath and fatigue since October 2025. He can only tolerate a couple of hours of activity before needing to rest, compared with prior 12-hour workdays. On bad days he feels extremely lethargic with minimal energy. He also has to care for his wife and do all the dog walking and grocery shopping.   He notes decreased appetite. He has long eaten one meal a day but now cannot finish that meal as he previously could.  He has coronary artery disease  with prior open-heart surgery. He was told recent echocardiograms are unchanged, with a scarred area on the heart consistent with a prior event and a mild to moderate mitral valve leak.  He is taking Repatha , last dose two days ago, and is unsure how to obtain refills. He has not taken losartan  recently and is unsure if he has any at home. He stopped amlodipine  after experiencing auditory hallucinations.  He has COPD and was prescribed Trelegy but has only used it two or three times and does not use it regularly.  He is the primary caregiver for his wife with dementia and manages all household tasks. He reports financial stress from unpaid leave and responsibility for his wife's care and finances.  Reports no chest pain, pressure, or tightness. No edema, orthopnea, PND. Reports no palpitations.   Discussed the use of AI scribe software for clinical note transcription with the patient, who gave verbal consent to proceed.   ROS: Pertinent ROS in HPI  Studies Reviewed     Echo 02/02/2022 IMPRESSIONS     1. Left ventricular ejection fraction, by estimation, is 55 to 60%. The  left ventricle has normal function. The left ventricle demonstrates  regional wall motion abnormalities (see scoring diagram/findings for  description). Left ventricular diastolic  parameters are consistent with Grade I diastolic dysfunction (impaired  relaxation).   2. Right ventricular systolic function is normal. The right ventricular  size is normal.   3. The mitral valve is normal in structure. Mild mitral valve  regurgitation. No evidence of mitral stenosis.   4. The aortic valve is calcified. There is moderate calcification of the  aortic valve. Aortic valve regurgitation is not visualized. Aortic valve  sclerosis is present, with no evidence of aortic valve stenosis.   5. The inferior vena cava is normal in size with greater than 50%  respiratory variability, suggesting right atrial pressure of 3 mmHg.        Physical Exam VS:  BP (!) 174/108   Pulse 69   Ht 6' (1.829 m)   Wt 216 lb (98 kg)   SpO2 98%   BMI 29.29 kg/m        Wt Readings from Last 3 Encounters:  12/27/24 216 lb (98 kg)  11/06/24 208 lb 9.6 oz (94.6 kg)  10/20/24 211 lb 10.3 oz (96 kg)    GEN: Well nourished, well developed in no acute distress NECK: No JVD; No carotid bruits CARDIAC: RRR, no murmurs, rubs, gallops RESPIRATORY:  Clear to auscultation without rales, wheezing or rhonchi  ABDOMEN: Soft, non-tender, non-distended EXTREMITIES:  No edema; No deformity   ASSESSMENT AND PLAN  Fatigue and exertional dyspnea under evaluation Persistent fatigue and exertional dyspnea with normal heart pump function, impaired relaxation, and mild to moderate mitral valve regurgitation. Stress test considered for coronary artery disease progression. - Ordered chemical stress test to evaluate for coronary artery disease progression. - If stress  test is abnormal, consider cardiac catheterization. - If stress test is normal, consider pulmonary evaluation with pulmonary function tests.  Coronary artery disease involving native coronary arteries Coronary artery disease with no significant changes on recent echocardiogram. Stress test planned to assess for progression and its impact on symptoms. - Ordered chemical stress test to assess for coronary artery disease progression.  Ischemic cardiomyopathy with prior myocardial scar Ischemic cardiomyopathy with myocardial scar noted on echocardiogram. Heart pump function remains normal.  Mitral valve regurgitation, mild to moderate Mitral valve regurgitation progressed from mild to moderate since 2022.  - Continue annual echocardiogram to monitor mitral valve regurgitation.  Essential hypertension Hypertension with elevated blood pressure readings. Previous use of amlodipine  discontinued due to side effects. Plan to increase losartan  dosage. - Increased losartan  to 50 mg daily. -  Prescribed blood pressure cuff for home monitoring. - Instructed to track blood pressure daily for two weeks. - Will adjust medications based on blood pressure readings. -check BMP when he returns for his lipid panel  Dyslipidemia, on PCSK9 inhibitor and statin therapy, LDL goal <70 Dyslipidemia managed with Repatha  and Zetia . LDL previously reduced to 877 mg/dL. Goal is to achieve LDL <70 mg/dL. - Refilled Repatha  and Zetia  prescriptions. - Will recheck lipid panel in March to assess LDL levels.    Dispo: He can follow-up with me in 8 weeks or MD  Signed, Orren LOISE Fabry, PA-C   "

## 2024-12-27 ENCOUNTER — Other Ambulatory Visit: Payer: Self-pay | Admitting: Physician Assistant

## 2024-12-27 ENCOUNTER — Encounter (HOSPITAL_COMMUNITY): Payer: Self-pay | Admitting: *Deleted

## 2024-12-27 ENCOUNTER — Encounter: Payer: Self-pay | Admitting: Physician Assistant

## 2024-12-27 ENCOUNTER — Other Ambulatory Visit (HOSPITAL_COMMUNITY): Payer: Self-pay

## 2024-12-27 ENCOUNTER — Ambulatory Visit: Attending: Cardiovascular Disease | Admitting: Physician Assistant

## 2024-12-27 VITALS — BP 174/108 | HR 69 | Ht 72.0 in | Wt 216.0 lb

## 2024-12-27 DIAGNOSIS — E785 Hyperlipidemia, unspecified: Secondary | ICD-10-CM

## 2024-12-27 DIAGNOSIS — I10 Essential (primary) hypertension: Secondary | ICD-10-CM | POA: Diagnosis not present

## 2024-12-27 DIAGNOSIS — R0609 Other forms of dyspnea: Secondary | ICD-10-CM

## 2024-12-27 DIAGNOSIS — R5383 Other fatigue: Secondary | ICD-10-CM

## 2024-12-27 DIAGNOSIS — I255 Ischemic cardiomyopathy: Secondary | ICD-10-CM

## 2024-12-27 DIAGNOSIS — I6523 Occlusion and stenosis of bilateral carotid arteries: Secondary | ICD-10-CM | POA: Diagnosis not present

## 2024-12-27 DIAGNOSIS — R002 Palpitations: Secondary | ICD-10-CM

## 2024-12-27 DIAGNOSIS — I2510 Atherosclerotic heart disease of native coronary artery without angina pectoris: Secondary | ICD-10-CM | POA: Diagnosis not present

## 2024-12-27 MED ORDER — ROSUVASTATIN CALCIUM 40 MG PO TABS: 40.0000 mg | ORAL_TABLET | Freq: Every day | ORAL | 3 refills | Status: AC

## 2024-12-27 MED ORDER — LOSARTAN POTASSIUM 50 MG PO TABS: 50.0000 mg | ORAL_TABLET | Freq: Every day | ORAL | 2 refills | Status: AC

## 2024-12-27 MED ORDER — OMRON 3 SERIES BP MONITOR DEVI
1.0000 | Freq: Every day | 0 refills | Status: AC
  Filled 2024-12-27: qty 1, 30d supply, fill #0

## 2024-12-27 NOTE — Telephone Encounter (Signed)
 Forms have been completed and handed to forms coordinator.

## 2024-12-27 NOTE — Telephone Encounter (Signed)
 Disability form faxed to [employer] and scanned into chart. Billing notified.

## 2024-12-27 NOTE — Patient Instructions (Addendum)
 Medication Instructions:  STOP Norvasc  (amlodipine ) INCREASE Losartan  (cozaar ) to 50mg  Take 1 tablet once a day  A prescription for a blood pressure cuff has been sent to the pharmacy downstairs. Please stop by to pick it up. The cost is approximately $35, though some insurances plans may cover it. Be sure to use the cuff regularly to monitor blood pressure as recommended by your provider.   *If you need a refill on your cardiac medications before your next appointment, please call your pharmacy*  Lab Work: BMET & FASTING LIPIDS (ON OR AROUND 02/24/2025) If you have labs (blood work) drawn today and your tests are completely normal, you will receive your results only by: MyChart Message (if you have MyChart) OR A paper copy in the mail If you have any lab test that is abnormal or we need to change your treatment, we will call you to review the results.  Testing/Procedures: Your physician has requested that you have a lexiscan  myoview . For further information please visit [email or web address] Please follow instruction sheet, as given.   Follow-Up: At [facility], you and your health needs are our priority.  As part of our continuing mission to provide you with exceptional heart care, our providers are all part of one team.  This team includes your primary Cardiologist (physician) and Advanced Practice Providers or APPs (Physician Assistants and Nurse Practitioners) who all work together to provide you with the care you need, when you need it.  Your next appointment:   2 month(s)  Provider:   Orren Fabry, PA-C        We recommend signing up for the patient portal called MyChart.  Sign up information is provided on this After Visit Summary.  MyChart is used to connect with patients for Virtual Visits (Telemedicine).  Patients are able to view lab/test results, encounter notes, upcoming appointments, etc.  Non-urgent messages can be sent to your provider as well.   To learn more  about what you can do with MyChart, go to [email or web address].   Other Instructions

## 2025-01-01 ENCOUNTER — Ambulatory Visit (HOSPITAL_COMMUNITY)
Admission: RE | Admit: 2025-01-01 | Discharge: 2025-01-01 | Disposition: A | Source: Ambulatory Visit | Attending: Physician Assistant | Admitting: Physician Assistant

## 2025-01-01 DIAGNOSIS — E785 Hyperlipidemia, unspecified: Secondary | ICD-10-CM | POA: Insufficient documentation

## 2025-01-01 DIAGNOSIS — I10 Essential (primary) hypertension: Secondary | ICD-10-CM | POA: Insufficient documentation

## 2025-01-01 DIAGNOSIS — I255 Ischemic cardiomyopathy: Secondary | ICD-10-CM | POA: Diagnosis not present

## 2025-01-01 DIAGNOSIS — I6523 Occlusion and stenosis of bilateral carotid arteries: Secondary | ICD-10-CM | POA: Diagnosis not present

## 2025-01-01 DIAGNOSIS — R5383 Other fatigue: Secondary | ICD-10-CM | POA: Insufficient documentation

## 2025-01-01 DIAGNOSIS — R0609 Other forms of dyspnea: Secondary | ICD-10-CM | POA: Insufficient documentation

## 2025-01-01 DIAGNOSIS — I2510 Atherosclerotic heart disease of native coronary artery without angina pectoris: Secondary | ICD-10-CM | POA: Insufficient documentation

## 2025-01-01 DIAGNOSIS — R002 Palpitations: Secondary | ICD-10-CM | POA: Diagnosis present

## 2025-01-01 LAB — MYOCARDIAL PERFUSION IMAGING
LV dias vol: 135 mL (ref 62–150)
LV sys vol: 71 mL
Nuc Stress EF: 47 %
Peak HR: 107 {beats}/min
Rest HR: 96 {beats}/min
Rest Nuclear Isotope Dose: 10.6 mCi
SDS: 0
SRS: 2
SSS: 0
ST Depression (mm): 0 mm
Stress Nuclear Isotope Dose: 31.4 mCi
TID: 1.04

## 2025-01-01 MED ORDER — TECHNETIUM TC 99M TETROFOSMIN IV KIT
10.6000 | PACK | Freq: Once | INTRAVENOUS | Status: AC | PRN
  Administered 2025-01-01: 10.6 via INTRAVENOUS

## 2025-01-01 MED ORDER — REGADENOSON 0.4 MG/5ML IV SOLN
INTRAVENOUS | Status: AC
  Filled 2025-01-01: qty 5

## 2025-01-01 MED ORDER — REGADENOSON 0.4 MG/5ML IV SOLN
0.4000 mg | Freq: Once | INTRAVENOUS | Status: AC
  Administered 2025-01-01: 0.4 mg via INTRAVENOUS

## 2025-01-01 MED ORDER — TECHNETIUM TC 99M TETROFOSMIN IV KIT
31.4000 | PACK | Freq: Once | INTRAVENOUS | Status: AC | PRN
  Administered 2025-01-01: 31.4 via INTRAVENOUS

## 2025-01-02 ENCOUNTER — Ambulatory Visit: Payer: Self-pay | Admitting: Physician Assistant

## 2025-01-02 DIAGNOSIS — R0609 Other forms of dyspnea: Secondary | ICD-10-CM

## 2025-01-05 ENCOUNTER — Other Ambulatory Visit (HOSPITAL_COMMUNITY): Payer: Self-pay

## 2025-01-05 MED ORDER — NITROGLYCERIN 0.4 MG SL SUBL: 0.4000 mg | SUBLINGUAL_TABLET | SUBLINGUAL | 1 refills | Status: AC | PRN

## 2025-01-05 MED ORDER — NITROGLYCERIN 0.4 MG SL SUBL: 0.4000 mg | SUBLINGUAL_TABLET | SUBLINGUAL | 3 refills | Status: AC | PRN

## 2025-01-05 NOTE — Telephone Encounter (Signed)
 Patient is requesting a call back to discuss because he no longer has access to MyChart.

## 2025-01-09 ENCOUNTER — Ambulatory Visit: Payer: Self-pay | Admitting: Physician Assistant

## 2025-01-18 ENCOUNTER — Other Ambulatory Visit: Payer: Self-pay

## 2025-01-18 DIAGNOSIS — E785 Hyperlipidemia, unspecified: Secondary | ICD-10-CM

## 2025-01-19 ENCOUNTER — Other Ambulatory Visit: Payer: Self-pay | Admitting: Cardiology

## 2025-01-19 ENCOUNTER — Telehealth: Payer: Self-pay | Admitting: Internal Medicine

## 2025-01-19 DIAGNOSIS — E785 Hyperlipidemia, unspecified: Secondary | ICD-10-CM

## 2025-01-19 NOTE — Telephone Encounter (Signed)
 Will not reach back out to them. I spoke with the patient and he doesn't use this pharmacy at all.

## 2025-01-22 ENCOUNTER — Other Ambulatory Visit: Payer: Self-pay | Admitting: Cardiology

## 2025-01-22 DIAGNOSIS — E785 Hyperlipidemia, unspecified: Secondary | ICD-10-CM

## 2025-01-22 MED ORDER — CARVEDILOL 3.125 MG PO TABS: 3.1250 mg | ORAL_TABLET | Freq: Two times a day (BID) | ORAL | 3 refills | Status: AC

## 2025-01-22 MED ORDER — EZETIMIBE 10 MG PO TABS: 10.0000 mg | ORAL_TABLET | Freq: Every day | ORAL | 3 refills | Status: AC

## 2025-02-28 ENCOUNTER — Ambulatory Visit: Admitting: Physician Assistant
